# Patient Record
Sex: Female | Born: 1940 | Race: White | Hispanic: No | Marital: Married | Smoking: Former smoker
Health system: Southern US, Community
[De-identification: ages and names within clinical notes are randomized; demographics above are authoritative.]

## PROBLEM LIST (undated history)

## (undated) DIAGNOSIS — E7800 Pure hypercholesterolemia, unspecified: Secondary | ICD-10-CM

## (undated) DIAGNOSIS — M1990 Unspecified osteoarthritis, unspecified site: Secondary | ICD-10-CM

## (undated) DIAGNOSIS — I10 Essential (primary) hypertension: Secondary | ICD-10-CM

## (undated) DIAGNOSIS — R42 Dizziness and giddiness: Secondary | ICD-10-CM

## (undated) DIAGNOSIS — M19011 Primary osteoarthritis, right shoulder: Secondary | ICD-10-CM

## (undated) DIAGNOSIS — M8580 Other specified disorders of bone density and structure, unspecified site: Secondary | ICD-10-CM

## (undated) DIAGNOSIS — Z8709 Personal history of other diseases of the respiratory system: Secondary | ICD-10-CM

## (undated) DIAGNOSIS — R0600 Dyspnea, unspecified: Secondary | ICD-10-CM

## (undated) DIAGNOSIS — Z8701 Personal history of pneumonia (recurrent): Secondary | ICD-10-CM

## (undated) DIAGNOSIS — Z9889 Other specified postprocedural states: Secondary | ICD-10-CM

## (undated) DIAGNOSIS — R112 Nausea with vomiting, unspecified: Secondary | ICD-10-CM

## (undated) DIAGNOSIS — E669 Obesity, unspecified: Secondary | ICD-10-CM

## (undated) DIAGNOSIS — F419 Anxiety disorder, unspecified: Secondary | ICD-10-CM

## (undated) DIAGNOSIS — Z87442 Personal history of urinary calculi: Secondary | ICD-10-CM

## (undated) DIAGNOSIS — F329 Major depressive disorder, single episode, unspecified: Secondary | ICD-10-CM

## (undated) DIAGNOSIS — F32A Depression, unspecified: Secondary | ICD-10-CM

## (undated) DIAGNOSIS — G4700 Insomnia, unspecified: Secondary | ICD-10-CM

## (undated) DIAGNOSIS — M797 Fibromyalgia: Secondary | ICD-10-CM

## (undated) DIAGNOSIS — J45909 Unspecified asthma, uncomplicated: Secondary | ICD-10-CM

## (undated) DIAGNOSIS — Z862 Personal history of diseases of the blood and blood-forming organs and certain disorders involving the immune mechanism: Secondary | ICD-10-CM

## (undated) DIAGNOSIS — G629 Polyneuropathy, unspecified: Secondary | ICD-10-CM

## (undated) DIAGNOSIS — K5790 Diverticulosis of intestine, part unspecified, without perforation or abscess without bleeding: Secondary | ICD-10-CM

## (undated) DIAGNOSIS — I82409 Acute embolism and thrombosis of unspecified deep veins of unspecified lower extremity: Secondary | ICD-10-CM

## (undated) DIAGNOSIS — J189 Pneumonia, unspecified organism: Secondary | ICD-10-CM

## (undated) DIAGNOSIS — D649 Anemia, unspecified: Secondary | ICD-10-CM

## (undated) DIAGNOSIS — R7303 Prediabetes: Secondary | ICD-10-CM

## (undated) DIAGNOSIS — E559 Vitamin D deficiency, unspecified: Secondary | ICD-10-CM

## (undated) DIAGNOSIS — Z973 Presence of spectacles and contact lenses: Secondary | ICD-10-CM

## (undated) HISTORY — PX: TUBAL LIGATION: SHX77

## (undated) HISTORY — PX: MENISCUS REPAIR: SHX5179

## (undated) HISTORY — PX: FOOT FRACTURE SURGERY: SHX645

## (undated) HISTORY — PX: APPENDECTOMY: SHX54

## (undated) HISTORY — PX: BREAST BIOPSY: SHX20

## (undated) HISTORY — PX: SHOULDER SURGERY: SHX246

## (undated) HISTORY — PX: BACK SURGERY: SHX140

## (undated) HISTORY — PX: PLANTAR FASCIA SURGERY: SHX746

## (undated) HISTORY — PX: BLADDER REPAIR: SHX76

## (undated) HISTORY — PX: EYE SURGERY: SHX253

## (undated) HISTORY — PX: RECTOCELE REPAIR: SHX761

## (undated) HISTORY — PX: BREAST LUMPECTOMY: SHX2

## (undated) HISTORY — PX: KNEE CARTILAGE SURGERY: SHX688

## (undated) HISTORY — PX: CHOLECYSTECTOMY: SHX55

## (undated) HISTORY — PX: KNEE ARTHROPLASTY: SHX992

## (undated) HISTORY — PX: COLONOSCOPY W/ POLYPECTOMY: SHX1380

## (undated) HISTORY — PX: SHOULDER ARTHROSCOPY: SHX128

## (undated) HISTORY — PX: ABDOMINAL HYSTERECTOMY: SHX81

## (undated) HISTORY — PX: ESOPHAGOGASTRODUODENOSCOPY: SHX1529

---

## 2015-09-12 DIAGNOSIS — M1611 Unilateral primary osteoarthritis, right hip: Secondary | ICD-10-CM | POA: Diagnosis not present

## 2015-09-12 DIAGNOSIS — G609 Hereditary and idiopathic neuropathy, unspecified: Secondary | ICD-10-CM | POA: Diagnosis not present

## 2015-09-12 DIAGNOSIS — M25551 Pain in right hip: Secondary | ICD-10-CM | POA: Diagnosis not present

## 2015-09-12 DIAGNOSIS — Z6838 Body mass index (BMI) 38.0-38.9, adult: Secondary | ICD-10-CM | POA: Diagnosis not present

## 2015-09-12 DIAGNOSIS — E669 Obesity, unspecified: Secondary | ICD-10-CM | POA: Diagnosis not present

## 2015-09-12 DIAGNOSIS — M7061 Trochanteric bursitis, right hip: Secondary | ICD-10-CM | POA: Diagnosis not present

## 2015-09-18 DIAGNOSIS — G609 Hereditary and idiopathic neuropathy, unspecified: Secondary | ICD-10-CM | POA: Diagnosis not present

## 2015-09-18 DIAGNOSIS — M797 Fibromyalgia: Secondary | ICD-10-CM | POA: Diagnosis not present

## 2015-09-18 DIAGNOSIS — M5416 Radiculopathy, lumbar region: Secondary | ICD-10-CM | POA: Diagnosis not present

## 2015-10-03 DIAGNOSIS — M5442 Lumbago with sciatica, left side: Secondary | ICD-10-CM | POA: Diagnosis not present

## 2015-10-03 DIAGNOSIS — M1611 Unilateral primary osteoarthritis, right hip: Secondary | ICD-10-CM | POA: Diagnosis not present

## 2015-10-03 DIAGNOSIS — M5441 Lumbago with sciatica, right side: Secondary | ICD-10-CM | POA: Diagnosis not present

## 2015-10-03 DIAGNOSIS — M4807 Spinal stenosis, lumbosacral region: Secondary | ICD-10-CM | POA: Diagnosis not present

## 2015-10-03 DIAGNOSIS — G8929 Other chronic pain: Secondary | ICD-10-CM | POA: Diagnosis not present

## 2015-10-11 DIAGNOSIS — M797 Fibromyalgia: Secondary | ICD-10-CM | POA: Diagnosis not present

## 2015-10-11 DIAGNOSIS — G609 Hereditary and idiopathic neuropathy, unspecified: Secondary | ICD-10-CM | POA: Diagnosis not present

## 2015-10-11 DIAGNOSIS — M5416 Radiculopathy, lumbar region: Secondary | ICD-10-CM | POA: Diagnosis not present

## 2015-10-13 DIAGNOSIS — Z87891 Personal history of nicotine dependence: Secondary | ICD-10-CM | POA: Diagnosis not present

## 2015-10-13 DIAGNOSIS — M1712 Unilateral primary osteoarthritis, left knee: Secondary | ICD-10-CM | POA: Diagnosis not present

## 2015-10-13 DIAGNOSIS — M25562 Pain in left knee: Secondary | ICD-10-CM | POA: Diagnosis not present

## 2015-10-13 DIAGNOSIS — M660 Rupture of popliteal cyst: Secondary | ICD-10-CM | POA: Diagnosis not present

## 2015-10-13 DIAGNOSIS — M79662 Pain in left lower leg: Secondary | ICD-10-CM | POA: Diagnosis not present

## 2015-10-18 DIAGNOSIS — G8929 Other chronic pain: Secondary | ICD-10-CM | POA: Diagnosis not present

## 2015-10-18 DIAGNOSIS — E538 Deficiency of other specified B group vitamins: Secondary | ICD-10-CM | POA: Diagnosis not present

## 2015-10-18 DIAGNOSIS — M545 Low back pain: Secondary | ICD-10-CM | POA: Diagnosis not present

## 2015-10-23 DIAGNOSIS — H04122 Dry eye syndrome of left lacrimal gland: Secondary | ICD-10-CM | POA: Diagnosis not present

## 2015-10-25 DIAGNOSIS — M791 Myalgia: Secondary | ICD-10-CM | POA: Diagnosis not present

## 2015-10-25 DIAGNOSIS — M25551 Pain in right hip: Secondary | ICD-10-CM | POA: Diagnosis not present

## 2015-10-25 DIAGNOSIS — Z96659 Presence of unspecified artificial knee joint: Secondary | ICD-10-CM | POA: Diagnosis not present

## 2015-10-25 DIAGNOSIS — Z96651 Presence of right artificial knee joint: Secondary | ICD-10-CM | POA: Diagnosis not present

## 2015-10-25 DIAGNOSIS — M47816 Spondylosis without myelopathy or radiculopathy, lumbar region: Secondary | ICD-10-CM | POA: Diagnosis not present

## 2015-10-25 DIAGNOSIS — Z79899 Other long term (current) drug therapy: Secondary | ICD-10-CM | POA: Diagnosis not present

## 2015-10-25 DIAGNOSIS — G8929 Other chronic pain: Secondary | ICD-10-CM | POA: Diagnosis not present

## 2015-10-25 DIAGNOSIS — G894 Chronic pain syndrome: Secondary | ICD-10-CM | POA: Diagnosis not present

## 2015-10-25 DIAGNOSIS — Z5181 Encounter for therapeutic drug level monitoring: Secondary | ICD-10-CM | POA: Diagnosis not present

## 2015-10-25 DIAGNOSIS — M25561 Pain in right knee: Secondary | ICD-10-CM | POA: Diagnosis not present

## 2015-10-25 DIAGNOSIS — M25562 Pain in left knee: Secondary | ICD-10-CM | POA: Diagnosis not present

## 2015-10-25 DIAGNOSIS — E1340 Other specified diabetes mellitus with diabetic neuropathy, unspecified: Secondary | ICD-10-CM | POA: Diagnosis not present

## 2015-10-31 DIAGNOSIS — Z1231 Encounter for screening mammogram for malignant neoplasm of breast: Secondary | ICD-10-CM | POA: Diagnosis not present

## 2015-11-01 DIAGNOSIS — K5900 Constipation, unspecified: Secondary | ICD-10-CM | POA: Diagnosis not present

## 2015-11-03 DIAGNOSIS — M1712 Unilateral primary osteoarthritis, left knee: Secondary | ICD-10-CM | POA: Diagnosis not present

## 2015-11-03 DIAGNOSIS — M25562 Pain in left knee: Secondary | ICD-10-CM | POA: Diagnosis not present

## 2015-11-03 DIAGNOSIS — J45909 Unspecified asthma, uncomplicated: Secondary | ICD-10-CM | POA: Diagnosis not present

## 2015-11-03 DIAGNOSIS — I10 Essential (primary) hypertension: Secondary | ICD-10-CM | POA: Diagnosis not present

## 2015-11-03 DIAGNOSIS — Z79899 Other long term (current) drug therapy: Secondary | ICD-10-CM | POA: Diagnosis not present

## 2015-11-03 DIAGNOSIS — Z885 Allergy status to narcotic agent status: Secondary | ICD-10-CM | POA: Diagnosis not present

## 2015-11-03 DIAGNOSIS — Z87891 Personal history of nicotine dependence: Secondary | ICD-10-CM | POA: Diagnosis not present

## 2015-11-03 DIAGNOSIS — E7800 Pure hypercholesterolemia, unspecified: Secondary | ICD-10-CM | POA: Diagnosis not present

## 2015-11-03 DIAGNOSIS — K5900 Constipation, unspecified: Secondary | ICD-10-CM | POA: Diagnosis not present

## 2015-11-03 DIAGNOSIS — Z880 Allergy status to penicillin: Secondary | ICD-10-CM | POA: Diagnosis not present

## 2015-11-03 DIAGNOSIS — Z888 Allergy status to other drugs, medicaments and biological substances status: Secondary | ICD-10-CM | POA: Diagnosis not present

## 2015-11-03 DIAGNOSIS — Z881 Allergy status to other antibiotic agents status: Secondary | ICD-10-CM | POA: Diagnosis not present

## 2015-11-15 DIAGNOSIS — R920 Mammographic microcalcification found on diagnostic imaging of breast: Secondary | ICD-10-CM | POA: Diagnosis not present

## 2015-11-15 DIAGNOSIS — R921 Mammographic calcification found on diagnostic imaging of breast: Secondary | ICD-10-CM | POA: Diagnosis not present

## 2015-11-15 DIAGNOSIS — E538 Deficiency of other specified B group vitamins: Secondary | ICD-10-CM | POA: Diagnosis not present

## 2015-11-16 DIAGNOSIS — R920 Mammographic microcalcification found on diagnostic imaging of breast: Secondary | ICD-10-CM | POA: Diagnosis not present

## 2015-11-21 DIAGNOSIS — M1611 Unilateral primary osteoarthritis, right hip: Secondary | ICD-10-CM | POA: Diagnosis not present

## 2015-11-21 DIAGNOSIS — G894 Chronic pain syndrome: Secondary | ICD-10-CM | POA: Diagnosis not present

## 2015-11-21 DIAGNOSIS — M25551 Pain in right hip: Secondary | ICD-10-CM | POA: Diagnosis not present

## 2015-11-24 DIAGNOSIS — M170 Bilateral primary osteoarthritis of knee: Secondary | ICD-10-CM | POA: Diagnosis not present

## 2015-11-24 DIAGNOSIS — M1712 Unilateral primary osteoarthritis, left knee: Secondary | ICD-10-CM | POA: Diagnosis not present

## 2015-11-27 DIAGNOSIS — Z961 Presence of intraocular lens: Secondary | ICD-10-CM | POA: Diagnosis not present

## 2015-11-27 DIAGNOSIS — H43812 Vitreous degeneration, left eye: Secondary | ICD-10-CM | POA: Diagnosis not present

## 2015-11-27 DIAGNOSIS — H5203 Hypermetropia, bilateral: Secondary | ICD-10-CM | POA: Diagnosis not present

## 2015-11-27 DIAGNOSIS — H04123 Dry eye syndrome of bilateral lacrimal glands: Secondary | ICD-10-CM | POA: Diagnosis not present

## 2015-11-27 DIAGNOSIS — H43391 Other vitreous opacities, right eye: Secondary | ICD-10-CM | POA: Diagnosis not present

## 2015-11-29 DIAGNOSIS — Z0000 Encounter for general adult medical examination without abnormal findings: Secondary | ICD-10-CM | POA: Diagnosis not present

## 2015-12-01 DIAGNOSIS — M170 Bilateral primary osteoarthritis of knee: Secondary | ICD-10-CM | POA: Diagnosis not present

## 2015-12-01 DIAGNOSIS — M1712 Unilateral primary osteoarthritis, left knee: Secondary | ICD-10-CM | POA: Diagnosis not present

## 2015-12-08 DIAGNOSIS — M7052 Other bursitis of knee, left knee: Secondary | ICD-10-CM | POA: Diagnosis not present

## 2015-12-08 DIAGNOSIS — M1712 Unilateral primary osteoarthritis, left knee: Secondary | ICD-10-CM | POA: Diagnosis not present

## 2015-12-11 DIAGNOSIS — F330 Major depressive disorder, recurrent, mild: Secondary | ICD-10-CM | POA: Diagnosis not present

## 2015-12-11 DIAGNOSIS — Z6838 Body mass index (BMI) 38.0-38.9, adult: Secondary | ICD-10-CM | POA: Diagnosis not present

## 2015-12-11 DIAGNOSIS — L738 Other specified follicular disorders: Secondary | ICD-10-CM | POA: Diagnosis not present

## 2015-12-11 DIAGNOSIS — E538 Deficiency of other specified B group vitamins: Secondary | ICD-10-CM | POA: Diagnosis not present

## 2015-12-11 DIAGNOSIS — G44229 Chronic tension-type headache, not intractable: Secondary | ICD-10-CM | POA: Diagnosis not present

## 2015-12-11 DIAGNOSIS — R5383 Other fatigue: Secondary | ICD-10-CM | POA: Diagnosis not present

## 2015-12-11 DIAGNOSIS — E669 Obesity, unspecified: Secondary | ICD-10-CM | POA: Diagnosis not present

## 2015-12-19 DIAGNOSIS — M5416 Radiculopathy, lumbar region: Secondary | ICD-10-CM | POA: Diagnosis not present

## 2015-12-19 DIAGNOSIS — M797 Fibromyalgia: Secondary | ICD-10-CM | POA: Diagnosis not present

## 2015-12-19 DIAGNOSIS — E538 Deficiency of other specified B group vitamins: Secondary | ICD-10-CM | POA: Diagnosis not present

## 2015-12-19 DIAGNOSIS — G609 Hereditary and idiopathic neuropathy, unspecified: Secondary | ICD-10-CM | POA: Diagnosis not present

## 2015-12-19 DIAGNOSIS — G4700 Insomnia, unspecified: Secondary | ICD-10-CM | POA: Diagnosis not present

## 2015-12-21 DIAGNOSIS — M5416 Radiculopathy, lumbar region: Secondary | ICD-10-CM | POA: Diagnosis not present

## 2015-12-21 DIAGNOSIS — F5231 Female orgasmic disorder: Secondary | ICD-10-CM | POA: Diagnosis not present

## 2015-12-21 DIAGNOSIS — E538 Deficiency of other specified B group vitamins: Secondary | ICD-10-CM | POA: Diagnosis not present

## 2015-12-21 DIAGNOSIS — F330 Major depressive disorder, recurrent, mild: Secondary | ICD-10-CM | POA: Diagnosis not present

## 2015-12-21 DIAGNOSIS — J4530 Mild persistent asthma, uncomplicated: Secondary | ICD-10-CM | POA: Diagnosis not present

## 2015-12-21 DIAGNOSIS — E669 Obesity, unspecified: Secondary | ICD-10-CM | POA: Diagnosis not present

## 2015-12-21 DIAGNOSIS — M7552 Bursitis of left shoulder: Secondary | ICD-10-CM | POA: Diagnosis not present

## 2015-12-21 DIAGNOSIS — Z6839 Body mass index (BMI) 39.0-39.9, adult: Secondary | ICD-10-CM | POA: Diagnosis not present

## 2015-12-29 DIAGNOSIS — E538 Deficiency of other specified B group vitamins: Secondary | ICD-10-CM | POA: Diagnosis not present

## 2015-12-29 DIAGNOSIS — J4530 Mild persistent asthma, uncomplicated: Secondary | ICD-10-CM | POA: Diagnosis not present

## 2016-01-02 DIAGNOSIS — M25519 Pain in unspecified shoulder: Secondary | ICD-10-CM | POA: Diagnosis not present

## 2016-01-02 DIAGNOSIS — M791 Myalgia: Secondary | ICD-10-CM | POA: Diagnosis not present

## 2016-01-02 DIAGNOSIS — G5682 Other specified mononeuropathies of left upper limb: Secondary | ICD-10-CM | POA: Diagnosis not present

## 2016-01-02 DIAGNOSIS — G894 Chronic pain syndrome: Secondary | ICD-10-CM | POA: Diagnosis not present

## 2016-01-02 DIAGNOSIS — Z96651 Presence of right artificial knee joint: Secondary | ICD-10-CM | POA: Diagnosis not present

## 2016-01-02 DIAGNOSIS — G8929 Other chronic pain: Secondary | ICD-10-CM | POA: Diagnosis not present

## 2016-01-02 DIAGNOSIS — M25551 Pain in right hip: Secondary | ICD-10-CM | POA: Diagnosis not present

## 2016-01-02 DIAGNOSIS — M47816 Spondylosis without myelopathy or radiculopathy, lumbar region: Secondary | ICD-10-CM | POA: Diagnosis not present

## 2016-01-02 DIAGNOSIS — R1031 Right lower quadrant pain: Secondary | ICD-10-CM | POA: Diagnosis not present

## 2016-01-02 DIAGNOSIS — Z87891 Personal history of nicotine dependence: Secondary | ICD-10-CM | POA: Diagnosis not present

## 2016-01-02 DIAGNOSIS — M25512 Pain in left shoulder: Secondary | ICD-10-CM | POA: Diagnosis not present

## 2016-01-11 DIAGNOSIS — E538 Deficiency of other specified B group vitamins: Secondary | ICD-10-CM | POA: Diagnosis not present

## 2016-01-18 DIAGNOSIS — M25562 Pain in left knee: Secondary | ICD-10-CM | POA: Diagnosis not present

## 2016-01-18 DIAGNOSIS — G8929 Other chronic pain: Secondary | ICD-10-CM | POA: Diagnosis not present

## 2016-01-18 DIAGNOSIS — G894 Chronic pain syndrome: Secondary | ICD-10-CM | POA: Diagnosis not present

## 2016-01-18 DIAGNOSIS — M25551 Pain in right hip: Secondary | ICD-10-CM | POA: Diagnosis not present

## 2016-01-18 DIAGNOSIS — M25561 Pain in right knee: Secondary | ICD-10-CM | POA: Diagnosis not present

## 2016-02-06 DIAGNOSIS — G8929 Other chronic pain: Secondary | ICD-10-CM | POA: Diagnosis not present

## 2016-02-06 DIAGNOSIS — M25551 Pain in right hip: Secondary | ICD-10-CM | POA: Diagnosis not present

## 2016-02-13 DIAGNOSIS — E538 Deficiency of other specified B group vitamins: Secondary | ICD-10-CM | POA: Diagnosis not present

## 2016-02-13 DIAGNOSIS — M1611 Unilateral primary osteoarthritis, right hip: Secondary | ICD-10-CM | POA: Diagnosis not present

## 2016-02-13 DIAGNOSIS — M1712 Unilateral primary osteoarthritis, left knee: Secondary | ICD-10-CM | POA: Diagnosis not present

## 2016-02-13 DIAGNOSIS — G8929 Other chronic pain: Secondary | ICD-10-CM | POA: Diagnosis not present

## 2016-02-13 DIAGNOSIS — M25562 Pain in left knee: Secondary | ICD-10-CM | POA: Diagnosis not present

## 2016-02-13 DIAGNOSIS — G894 Chronic pain syndrome: Secondary | ICD-10-CM | POA: Diagnosis not present

## 2016-02-13 DIAGNOSIS — M25551 Pain in right hip: Secondary | ICD-10-CM | POA: Diagnosis not present

## 2016-02-13 DIAGNOSIS — M797 Fibromyalgia: Secondary | ICD-10-CM | POA: Diagnosis not present

## 2016-02-26 DIAGNOSIS — E538 Deficiency of other specified B group vitamins: Secondary | ICD-10-CM | POA: Diagnosis not present

## 2016-03-06 DIAGNOSIS — M1611 Unilateral primary osteoarthritis, right hip: Secondary | ICD-10-CM | POA: Diagnosis not present

## 2016-03-06 DIAGNOSIS — E538 Deficiency of other specified B group vitamins: Secondary | ICD-10-CM | POA: Diagnosis not present

## 2016-03-06 DIAGNOSIS — M797 Fibromyalgia: Secondary | ICD-10-CM | POA: Diagnosis not present

## 2016-03-06 DIAGNOSIS — G609 Hereditary and idiopathic neuropathy, unspecified: Secondary | ICD-10-CM | POA: Diagnosis not present

## 2016-03-06 DIAGNOSIS — M170 Bilateral primary osteoarthritis of knee: Secondary | ICD-10-CM | POA: Diagnosis not present

## 2016-03-11 DIAGNOSIS — M7062 Trochanteric bursitis, left hip: Secondary | ICD-10-CM | POA: Diagnosis not present

## 2016-03-11 DIAGNOSIS — E538 Deficiency of other specified B group vitamins: Secondary | ICD-10-CM | POA: Diagnosis not present

## 2016-03-11 DIAGNOSIS — G894 Chronic pain syndrome: Secondary | ICD-10-CM | POA: Diagnosis not present

## 2016-03-11 DIAGNOSIS — M7061 Trochanteric bursitis, right hip: Secondary | ICD-10-CM | POA: Diagnosis not present

## 2016-03-11 DIAGNOSIS — G8929 Other chronic pain: Secondary | ICD-10-CM | POA: Diagnosis not present

## 2016-03-11 DIAGNOSIS — M1611 Unilateral primary osteoarthritis, right hip: Secondary | ICD-10-CM | POA: Diagnosis not present

## 2016-03-26 DIAGNOSIS — E538 Deficiency of other specified B group vitamins: Secondary | ICD-10-CM | POA: Diagnosis not present

## 2016-04-10 DIAGNOSIS — E538 Deficiency of other specified B group vitamins: Secondary | ICD-10-CM | POA: Diagnosis not present

## 2016-04-18 DIAGNOSIS — M1611 Unilateral primary osteoarthritis, right hip: Secondary | ICD-10-CM | POA: Diagnosis not present

## 2016-04-23 DIAGNOSIS — I10 Essential (primary) hypertension: Secondary | ICD-10-CM | POA: Diagnosis not present

## 2016-04-23 DIAGNOSIS — E669 Obesity, unspecified: Secondary | ICD-10-CM | POA: Diagnosis not present

## 2016-04-23 DIAGNOSIS — Z0000 Encounter for general adult medical examination without abnormal findings: Secondary | ICD-10-CM | POA: Diagnosis not present

## 2016-04-23 DIAGNOSIS — E538 Deficiency of other specified B group vitamins: Secondary | ICD-10-CM | POA: Diagnosis not present

## 2016-04-23 DIAGNOSIS — Z6839 Body mass index (BMI) 39.0-39.9, adult: Secondary | ICD-10-CM | POA: Diagnosis not present

## 2016-05-07 DIAGNOSIS — M7552 Bursitis of left shoulder: Secondary | ICD-10-CM | POA: Diagnosis not present

## 2016-05-07 DIAGNOSIS — E538 Deficiency of other specified B group vitamins: Secondary | ICD-10-CM | POA: Diagnosis not present

## 2016-05-20 DIAGNOSIS — R921 Mammographic calcification found on diagnostic imaging of breast: Secondary | ICD-10-CM | POA: Diagnosis not present

## 2016-05-20 DIAGNOSIS — R920 Mammographic microcalcification found on diagnostic imaging of breast: Secondary | ICD-10-CM | POA: Diagnosis not present

## 2016-05-21 DIAGNOSIS — E538 Deficiency of other specified B group vitamins: Secondary | ICD-10-CM | POA: Diagnosis not present

## 2016-05-27 ENCOUNTER — Other Ambulatory Visit: Payer: Self-pay | Admitting: Surgery

## 2016-05-27 DIAGNOSIS — R921 Mammographic calcification found on diagnostic imaging of breast: Secondary | ICD-10-CM

## 2016-05-27 DIAGNOSIS — R920 Mammographic microcalcification found on diagnostic imaging of breast: Secondary | ICD-10-CM | POA: Diagnosis not present

## 2016-05-30 DIAGNOSIS — M1611 Unilateral primary osteoarthritis, right hip: Secondary | ICD-10-CM | POA: Diagnosis not present

## 2016-06-03 ENCOUNTER — Ambulatory Visit: Payer: Self-pay | Admitting: Orthopedic Surgery

## 2016-06-03 ENCOUNTER — Ambulatory Visit
Admission: RE | Admit: 2016-06-03 | Discharge: 2016-06-03 | Disposition: A | Discharge status: Home / Self Care | Payer: PPO | Source: Ambulatory Visit | Attending: Surgery | Admitting: Surgery

## 2016-06-03 DIAGNOSIS — R921 Mammographic calcification found on diagnostic imaging of breast: Secondary | ICD-10-CM | POA: Diagnosis not present

## 2016-06-03 DIAGNOSIS — N6011 Diffuse cystic mastopathy of right breast: Secondary | ICD-10-CM | POA: Diagnosis not present

## 2016-06-04 DIAGNOSIS — E538 Deficiency of other specified B group vitamins: Secondary | ICD-10-CM | POA: Diagnosis not present

## 2016-06-06 DIAGNOSIS — E538 Deficiency of other specified B group vitamins: Secondary | ICD-10-CM | POA: Diagnosis not present

## 2016-06-10 DIAGNOSIS — R920 Mammographic microcalcification found on diagnostic imaging of breast: Secondary | ICD-10-CM | POA: Diagnosis not present

## 2016-06-11 DIAGNOSIS — E538 Deficiency of other specified B group vitamins: Secondary | ICD-10-CM | POA: Diagnosis not present

## 2016-06-11 DIAGNOSIS — G609 Hereditary and idiopathic neuropathy, unspecified: Secondary | ICD-10-CM | POA: Diagnosis not present

## 2016-06-11 DIAGNOSIS — M1611 Unilateral primary osteoarthritis, right hip: Secondary | ICD-10-CM | POA: Diagnosis not present

## 2016-06-11 DIAGNOSIS — M5416 Radiculopathy, lumbar region: Secondary | ICD-10-CM | POA: Diagnosis not present

## 2016-06-12 NOTE — Pre-Procedure Instructions (Signed)
Evelyn Valdez  06/12/2016      [id number] - [place], [place] - [address] [place] [address] Phone: [phone] Fax: [phone]    Your procedure is scheduled on Monday, October 23rd, 2017.  Report to [facility] Admitting at 11:00 A.M.   Call this number if you have problems the morning of surgery:  [phone]   Remember:  Do not eat food or drink liquids after midnight.   Take these medicines the morning of surgery with A SIP OF WATER: Acetaminophen (Tylenol) if needed, Albuterol inhaler (please bring with you day of surgery), Mometasone inhaler if needed.    7 days prior to surgery, stop taking: Aspirin, NSAIDS, Aleve, Naproxen, Ibuprofen, Advil, Motrin, BC's, Goody's, fish oil, all herbal medications, and all vitamins.    Do not wear jewelry, make-up or nail polish.  Do not wear lotions, powders, or perfumes, or deoderant.  Do not shave 48 hours prior to surgery.    Do not bring valuables to the hospital.  [facility] is not responsible for any belongings or valuables.  Contacts, dentures or bridgework may not be worn into surgery.  Leave your suitcase in the car.  After surgery it may be brought to your room.  For patients admitted to the hospital, discharge time will be determined by your treatment team.  Patients discharged the day of surgery will not be allowed to drive home.   Special instructions:  Preparing for Surgery.   Please read over the following fact sheets that you were given. MRSA Information    [facility]- Preparing For Surgery  Before surgery, you can play an important role. Because skin is not sterile, your skin needs to be as free of germs as possible. You can reduce the number of germs on your skin by washing with CHG (chlorahexidine gluconate) Soap before surgery.  CHG is an antiseptic cleaner which kills germs and bonds with the skin to continue killing germs even after washing.  Please  do not use if you have an allergy to CHG or antibacterial soaps. If your skin becomes reddened/irritated stop using the CHG.  Do not shave (including legs and underarms) for at least 48 hours prior to first CHG shower. It is OK to shave your face.  Please follow these instructions carefully.   1. Shower the NIGHT BEFORE SURGERY and the MORNING OF SURGERY with CHG.   2. If you chose to wash your hair, wash your hair first as usual with your normal shampoo.  3. After you shampoo, rinse your hair and body thoroughly to remove the shampoo.  4. Use CHG as you would any other liquid soap. You can apply CHG directly to the skin and wash gently with a scrungie or a clean washcloth.   5. Apply the CHG Soap to your body ONLY FROM THE NECK DOWN.  Do not use on open wounds or open sores. Avoid contact with your eyes, ears, mouth and genitals (private parts). Wash genitals (private parts) with your normal soap.  6. Wash thoroughly, paying special attention to the area where your surgery will be performed.  7. Thoroughly rinse your body with warm water from the neck down.  8. DO NOT shower/wash with your normal soap after using and rinsing off the CHG Soap.  9. Pat yourself dry with a CLEAN TOWEL.   10. Wear CLEAN PAJAMAS   11. Place CLEAN SHEETS on your bed the night of your  first shower and DO NOT SLEEP WITH PETS.  Day of Surgery: Do not apply any deodorants/lotions. Please wear clean clothes to the hospital/surgery center.

## 2016-06-13 ENCOUNTER — Encounter (HOSPITAL_COMMUNITY)
Admission: RE | Admit: 2016-06-13 | Discharge: 2016-06-13 | Disposition: A | Discharge status: Home / Self Care | Payer: PPO | Source: Ambulatory Visit | Attending: Orthopedic Surgery | Admitting: Orthopedic Surgery

## 2016-06-13 ENCOUNTER — Encounter (HOSPITAL_COMMUNITY): Payer: Self-pay | Admitting: General Practice

## 2016-06-13 ENCOUNTER — Ambulatory Visit: Payer: Self-pay | Admitting: Orthopedic Surgery

## 2016-06-13 DIAGNOSIS — I10 Essential (primary) hypertension: Secondary | ICD-10-CM | POA: Insufficient documentation

## 2016-06-13 DIAGNOSIS — M1611 Unilateral primary osteoarthritis, right hip: Secondary | ICD-10-CM | POA: Insufficient documentation

## 2016-06-13 DIAGNOSIS — Z01810 Encounter for preprocedural cardiovascular examination: Secondary | ICD-10-CM | POA: Insufficient documentation

## 2016-06-13 DIAGNOSIS — Z01812 Encounter for preprocedural laboratory examination: Secondary | ICD-10-CM | POA: Insufficient documentation

## 2016-06-13 HISTORY — DX: Diverticulosis of intestine, part unspecified, without perforation or abscess without bleeding: K57.90

## 2016-06-13 HISTORY — DX: Anxiety disorder, unspecified: F41.9

## 2016-06-13 HISTORY — DX: Acute embolism and thrombosis of unspecified deep veins of unspecified lower extremity: I82.409

## 2016-06-13 HISTORY — DX: Unspecified asthma, uncomplicated: J45.909

## 2016-06-13 HISTORY — DX: Dyspnea, unspecified: R06.00

## 2016-06-13 HISTORY — DX: Vitamin D deficiency, unspecified: E55.9

## 2016-06-13 HISTORY — DX: Personal history of pneumonia (recurrent): Z87.01

## 2016-06-13 HISTORY — DX: Dizziness and giddiness: R42

## 2016-06-13 HISTORY — DX: Personal history of diseases of the blood and blood-forming organs and certain disorders involving the immune mechanism: Z86.2

## 2016-06-13 HISTORY — DX: Essential (primary) hypertension: I10

## 2016-06-13 HISTORY — DX: Unspecified osteoarthritis, unspecified site: M19.90

## 2016-06-13 HISTORY — DX: Insomnia, unspecified: G47.00

## 2016-06-13 HISTORY — DX: Fibromyalgia: M79.7

## 2016-06-13 HISTORY — DX: Pure hypercholesterolemia, unspecified: E78.00

## 2016-06-13 HISTORY — DX: Major depressive disorder, single episode, unspecified: F32.9

## 2016-06-13 HISTORY — DX: Presence of spectacles and contact lenses: Z97.3

## 2016-06-13 HISTORY — DX: Personal history of other diseases of the respiratory system: Z87.09

## 2016-06-13 HISTORY — DX: Personal history of urinary calculi: Z87.442

## 2016-06-13 HISTORY — DX: Obesity, unspecified: E66.9

## 2016-06-13 HISTORY — DX: Polyneuropathy, unspecified: G62.9

## 2016-06-13 HISTORY — DX: Depression, unspecified: F32.A

## 2016-06-13 HISTORY — DX: Other specified disorders of bone density and structure, unspecified site: M85.80

## 2016-06-13 LAB — BASIC METABOLIC PANEL
Anion gap: 6 (ref 5–15)
BUN: 13 mg/dL (ref 6–20)
CALCIUM: 9.1 mg/dL (ref 8.9–10.3)
CO2: 23 mmol/L (ref 22–32)
CREATININE: 0.84 mg/dL (ref 0.44–1.00)
Chloride: 110 mmol/L (ref 101–111)
GFR calc Af Amer: >60 mL/min (ref >60)
GLUCOSE: 107 mg/dL — AB (ref 65–99)
Potassium: 4.1 mmol/L (ref 3.5–5.1)
SODIUM: 139 mmol/L (ref 135–145)

## 2016-06-13 LAB — CBC
HCT: 42.6 % (ref 36.0–46.0)
Hemoglobin: 13.4 g/dL (ref 12.0–15.0)
MCH: 27.7 pg (ref 26.0–34.0)
MCHC: 31.5 g/dL (ref 30.0–36.0)
MCV: 88.2 fL (ref 78.0–100.0)
PLATELETS: 229 10*3/uL (ref 150–400)
RBC: 4.83 MIL/uL (ref 3.87–5.11)
RDW: 16.4 % — AB (ref 11.5–15.5)
WBC: 7.5 10*3/uL (ref 4.0–10.5)

## 2016-06-13 LAB — SURGICAL PCR SCREEN
MRSA, PCR: NEGATIVE
Staphylococcus aureus: NEGATIVE

## 2016-06-13 NOTE — H&P (Signed)
TOTAL HIP ADMISSION H&P  Patient is admitted for right total hip arthroplasty.  Subjective:  Chief Complaint: right hip pain  HPI: Evelyn Valdez, 75 y.o. female, has a history of pain and functional disability in the right hip(s) due to trauma and patient has failed non-surgical conservative treatments for greater than 12 weeks to include NSAID's and/or analgesics, flexibility and strengthening excercises, weight reduction as appropriate and activity modification.  Onset of symptoms was gradual starting >10 years ago with gradually worsening course since that time.The patient noted no past surgery on the right hip(s).  Patient currently rates pain in the right hip at 10 out of 10 with activity. Patient has night pain, worsening of pain with activity and weight bearing, pain that interfers with activities of daily living, pain with passive range of motion and crepitus. Patient has evidence of subchondral cysts, subchondral sclerosis, periarticular osteophytes and joint space narrowing by imaging studies. This condition presents safety issues increasing the risk of falls. There is no current active infection.  There are no active problems to display for this patient.  Past Medical History:  Diagnosis Date  . Anxiety   . Asthma    "well controlled"  . Depression   . Diverticulosis   . Dizziness   . DVT (deep venous thrombosis) ([place])    right lower leg after knee surgery  . Dyspnea    with exertion  . Fibromyalgia   . High cholesterol   . History of anemia   . History of bronchitis   . History of kidney stones   . History of pneumonia   . Hypertension   . Insomnia   . Obesity   . Osteoarthritis   . Osteopenia   . Peripheral neuropathy (HCC)    takes Topamax  . Vitamin D deficiency   . Wears glasses     Past Surgical History:  Procedure Laterality Date  . ABDOMINAL HYSTERECTOMY    . APPENDECTOMY    . BACK SURGERY    . BREAST BIOPSY Right    x2  . BREAST LUMPECTOMY Left    . CHOLECYSTECTOMY    . COLONOSCOPY W/ POLYPECTOMY    . ESOPHAGOGASTRODUODENOSCOPY    . EYE SURGERY Bilateral    cataracts  . FOOT FRACTURE SURGERY Left   . KNEE ARTHROPLASTY Right   . KNEE CARTILAGE SURGERY Right    x3  . MENISCUS REPAIR Left   . PLANTAR FASCIA SURGERY Bilateral   . RECTOCELE REPAIR    . SHOULDER SURGERY Right   . TUBAL LIGATION       (Not in a hospital admission) Allergies  Allergen Reactions  . Advair Diskus [Fluticasone-Salmeterol] Other (See Comments)    headaches  . Erythromycin Nausea And Vomiting  . Hydrocodone Nausea And Vomiting  . Morphine And Related Nausea And Vomiting  . Percocet [Oxycodone-Acetaminophen] Nausea And Vomiting  . Sulfa Antibiotics Nausea And Vomiting  . Penicillins Swelling and Rash    Has patient had a PCN reaction causing immediate rash, facial/tongue/throat swelling, SOB or lightheadedness with hypotension: Yes Has patient had a PCN reaction causing severe rash involving mucus membranes or skin necrosis: No Has patient had a PCN reaction that required hospitalization Yes Has patient had a PCN reaction occurring within the last 10 years: No If all of the above answers are "NO", then may proceed with Cephalosporin use.     Social History  Substance Use Topics  . Smoking status: Never Smoker  . Smokeless tobacco: Never Used  .  Alcohol use No    No family history on file.   Review of Systems  Constitutional: Negative.   HENT: Negative.   Eyes: Negative.   Respiratory: Positive for shortness of breath.   Cardiovascular: Negative.   Gastrointestinal: Negative.   Genitourinary: Negative.   Musculoskeletal: Positive for back pain and joint pain.  Skin: Negative.   Neurological: Negative.   Endo/Heme/Allergies: Negative.   Psychiatric/Behavioral: Negative.     Objective:  Physical Exam  Constitutional: She is oriented to person, place, and time. She appears well-developed and well-nourished.  HENT:  Head:  Normocephalic and atraumatic.  Eyes: Conjunctivae and EOM are normal. Pupils are equal, round, and reactive to light.  Neck: Normal range of motion. Neck supple.  Cardiovascular: Normal rate, regular rhythm and intact distal pulses.   Respiratory: Effort normal and breath sounds normal.  GI: Soft. Bowel sounds are normal. She exhibits no distension.  Genitourinary:  Genitourinary Comments: deferred  Musculoskeletal:       Right hip: She exhibits decreased range of motion.  Neurological: She is alert and oriented to person, place, and time. She has normal reflexes.  Skin: Skin is warm and dry.  Psychiatric: She has a normal mood and affect. Her behavior is normal. Judgment and thought content normal.    Vital signs in last 24 hours: @VSRANGES @  Labs:   Estimated body mass index is 37.98 kg/m as calculated from the following:   Height as of an earlier encounter on 06/13/16: 5\' 1"  (1.549 m).   Weight as of an earlier encounter on 06/13/16: 91.2 kg (201 lb).   Imaging Review Plain radiographs demonstrate severe degenerative joint disease of the right hip(s). The bone quality appears to be adequate for age and reported activity level.  Assessment/Plan:  End stage arthritis, right hip(s)  The patient history, physical examination, clinical judgement of the provider and imaging studies are consistent with end stage degenerative joint disease of the right hip(s) and total hip arthroplasty is deemed medically necessary. The treatment options including medical management, injection therapy, arthroscopy and arthroplasty were discussed at length. The risks and benefits of total hip arthroplasty were presented and reviewed. The risks due to aseptic loosening, infection, stiffness, dislocation/subluxation,  thromboembolic complications and other imponderables were discussed.  The patient acknowledged the explanation, agreed to proceed with the plan and consent was signed. Patient is being  admitted for inpatient treatment for surgery, pain control, PT, OT, prophylactic antibiotics, VTE prophylaxis, progressive ambulation and ADL's and discharge planning.The patient is planning to be discharged home with home health services. H/o DVT - apixaban

## 2016-06-13 NOTE — Progress Notes (Signed)
Left message for Margarita Grizzle at Dr. Sid Falcon office about patient's allergy to penicillin and order for Ancef 2g.

## 2016-06-13 NOTE — Progress Notes (Signed)
PCP - Dr. Dineen Kid with [facility] in [place]. Cardiologist - denies  EKG - 06/13/16 CXR - denies Echo/stress test/Cardiac cath - denies  Patient denies chest pain and shortness of breath at PAT appointment.

## 2016-06-21 MED ORDER — ACETAMINOPHEN 10 MG/ML IV SOLN
1000.0000 mg | INTRAVENOUS | Status: AC
  Administered 2016-06-24: 1000 mg via INTRAVENOUS

## 2016-06-21 MED ORDER — SODIUM CHLORIDE 0.9 % IV SOLN
2000.0000 mg | INTRAVENOUS | Status: DC
  Filled 2016-06-21: qty 20

## 2016-06-24 ENCOUNTER — Encounter (HOSPITAL_COMMUNITY)
Admission: RE | Disposition: A | Discharge status: Home Health | Payer: Self-pay | Source: Ambulatory Visit | Attending: Orthopedic Surgery

## 2016-06-24 ENCOUNTER — Inpatient Hospital Stay (HOSPITAL_COMMUNITY)
Admission: RE | Admit: 2016-06-24 | Discharge: 2016-06-25 | DRG: 470 | Disposition: A | Discharge status: Home Health | Payer: PPO | Source: Ambulatory Visit | Attending: Orthopedic Surgery | Admitting: Orthopedic Surgery

## 2016-06-24 ENCOUNTER — Inpatient Hospital Stay (HOSPITAL_COMMUNITY): Payer: PPO | Admitting: Anesthesiology

## 2016-06-24 ENCOUNTER — Inpatient Hospital Stay (HOSPITAL_COMMUNITY): Payer: PPO

## 2016-06-24 ENCOUNTER — Encounter (HOSPITAL_COMMUNITY): Payer: Self-pay | Admitting: Anesthesiology

## 2016-06-24 DIAGNOSIS — K5790 Diverticulosis of intestine, part unspecified, without perforation or abscess without bleeding: Secondary | ICD-10-CM | POA: Diagnosis not present

## 2016-06-24 DIAGNOSIS — M1611 Unilateral primary osteoarthritis, right hip: Secondary | ICD-10-CM | POA: Diagnosis not present

## 2016-06-24 DIAGNOSIS — Z880 Allergy status to penicillin: Secondary | ICD-10-CM | POA: Diagnosis not present

## 2016-06-24 DIAGNOSIS — G629 Polyneuropathy, unspecified: Secondary | ICD-10-CM | POA: Diagnosis present

## 2016-06-24 DIAGNOSIS — Z96651 Presence of right artificial knee joint: Secondary | ICD-10-CM | POA: Diagnosis present

## 2016-06-24 DIAGNOSIS — Z973 Presence of spectacles and contact lenses: Secondary | ICD-10-CM | POA: Diagnosis not present

## 2016-06-24 DIAGNOSIS — M8580 Other specified disorders of bone density and structure, unspecified site: Secondary | ICD-10-CM | POA: Diagnosis not present

## 2016-06-24 DIAGNOSIS — M25751 Osteophyte, right hip: Secondary | ICD-10-CM | POA: Diagnosis present

## 2016-06-24 DIAGNOSIS — Z79899 Other long term (current) drug therapy: Secondary | ICD-10-CM | POA: Diagnosis not present

## 2016-06-24 DIAGNOSIS — E7800 Pure hypercholesterolemia, unspecified: Secondary | ICD-10-CM | POA: Diagnosis not present

## 2016-06-24 DIAGNOSIS — J45909 Unspecified asthma, uncomplicated: Secondary | ICD-10-CM | POA: Diagnosis not present

## 2016-06-24 DIAGNOSIS — E559 Vitamin D deficiency, unspecified: Secondary | ICD-10-CM | POA: Diagnosis present

## 2016-06-24 DIAGNOSIS — Z885 Allergy status to narcotic agent status: Secondary | ICD-10-CM

## 2016-06-24 DIAGNOSIS — Z09 Encounter for follow-up examination after completed treatment for conditions other than malignant neoplasm: Secondary | ICD-10-CM

## 2016-06-24 DIAGNOSIS — Z882 Allergy status to sulfonamides status: Secondary | ICD-10-CM | POA: Diagnosis not present

## 2016-06-24 DIAGNOSIS — Z96641 Presence of right artificial hip joint: Secondary | ICD-10-CM | POA: Diagnosis not present

## 2016-06-24 DIAGNOSIS — E669 Obesity, unspecified: Secondary | ICD-10-CM | POA: Diagnosis present

## 2016-06-24 DIAGNOSIS — Z881 Allergy status to other antibiotic agents status: Secondary | ICD-10-CM

## 2016-06-24 DIAGNOSIS — I10 Essential (primary) hypertension: Secondary | ICD-10-CM | POA: Diagnosis present

## 2016-06-24 DIAGNOSIS — F418 Other specified anxiety disorders: Secondary | ICD-10-CM | POA: Diagnosis not present

## 2016-06-24 DIAGNOSIS — M797 Fibromyalgia: Secondary | ICD-10-CM | POA: Diagnosis not present

## 2016-06-24 DIAGNOSIS — M25551 Pain in right hip: Secondary | ICD-10-CM | POA: Diagnosis not present

## 2016-06-24 DIAGNOSIS — Z86718 Personal history of other venous thrombosis and embolism: Secondary | ICD-10-CM

## 2016-06-24 DIAGNOSIS — Z6837 Body mass index (BMI) 37.0-37.9, adult: Secondary | ICD-10-CM | POA: Diagnosis not present

## 2016-06-24 DIAGNOSIS — Z471 Aftercare following joint replacement surgery: Secondary | ICD-10-CM | POA: Diagnosis not present

## 2016-06-24 DIAGNOSIS — Z419 Encounter for procedure for purposes other than remedying health state, unspecified: Secondary | ICD-10-CM

## 2016-06-24 DIAGNOSIS — M169 Osteoarthritis of hip, unspecified: Secondary | ICD-10-CM | POA: Diagnosis not present

## 2016-06-24 HISTORY — PX: TOTAL HIP ARTHROPLASTY: SHX124

## 2016-06-24 LAB — TYPE AND SCREEN
ABO/RH(D): O NEG
Antibody Screen: POSITIVE

## 2016-06-24 SURGERY — ARTHROPLASTY, HIP, TOTAL, ANTERIOR APPROACH
Anesthesia: General | Site: Hip | Laterality: Right

## 2016-06-24 MED ORDER — KETOROLAC TROMETHAMINE 15 MG/ML IJ SOLN
7.5000 mg | Freq: Four times a day (QID) | INTRAMUSCULAR | Status: AC
  Administered 2016-06-24 – 2016-06-25 (×4): 7.5 mg via INTRAVENOUS
  Filled 2016-06-24 (×4): qty 1

## 2016-06-24 MED ORDER — ACETAMINOPHEN 325 MG PO TABS: 650.0000 mg | ORAL_TABLET | Freq: Four times a day (QID) | ORAL | Status: DC | PRN

## 2016-06-24 MED ORDER — VITAMIN D3 25 MCG (1000 UNIT) PO TABS
2000.0000 [IU] | ORAL_TABLET | Freq: Every day | ORAL | Status: DC
  Administered 2016-06-24 – 2016-06-25 (×2): 2000 [IU] via ORAL
  Filled 2016-06-24 (×3): qty 2

## 2016-06-24 MED ORDER — METHOCARBAMOL 1000 MG/10ML IJ SOLN
500.0000 mg | Freq: Four times a day (QID) | INTRAVENOUS | Status: DC | PRN
  Filled 2016-06-24: qty 5

## 2016-06-24 MED ORDER — TRAZODONE HCL 50 MG PO TABS: 50.0000 mg | ORAL_TABLET | Freq: Every evening | ORAL | Status: DC | PRN

## 2016-06-24 MED ORDER — DULOXETINE HCL 60 MG PO CPEP
60.0000 mg | ORAL_CAPSULE | Freq: Every evening | ORAL | Status: DC
  Administered 2016-06-24: 60 mg via ORAL
  Filled 2016-06-24: qty 1

## 2016-06-24 MED ORDER — FENTANYL CITRATE (PF) 100 MCG/2ML IJ SOLN
INTRAMUSCULAR | Status: AC
  Filled 2016-06-24: qty 2

## 2016-06-24 MED ORDER — LACTATED RINGERS IV SOLN
INTRAVENOUS | Status: DC | PRN
  Administered 2016-06-24 (×2): via INTRAVENOUS

## 2016-06-24 MED ORDER — VANCOMYCIN HCL IN DEXTROSE 1-5 GM/200ML-% IV SOLN
1000.0000 mg | Freq: Two times a day (BID) | INTRAVENOUS | Status: AC
  Administered 2016-06-25: 1000 mg via INTRAVENOUS
  Filled 2016-06-24: qty 200

## 2016-06-24 MED ORDER — ARTIFICIAL TEARS OP OINT
TOPICAL_OINTMENT | OPHTHALMIC | Status: AC
  Filled 2016-06-24: qty 3.5

## 2016-06-24 MED ORDER — PROMETHAZINE HCL 25 MG/ML IJ SOLN: 6.2500 mg | INTRAMUSCULAR | Status: DC | PRN

## 2016-06-24 MED ORDER — SODIUM CHLORIDE 0.9 % IV SOLN: INTRAVENOUS | Status: DC

## 2016-06-24 MED ORDER — DEXAMETHASONE SODIUM PHOSPHATE 10 MG/ML IJ SOLN
INTRAMUSCULAR | Status: AC
  Filled 2016-06-24: qty 1

## 2016-06-24 MED ORDER — MENTHOL 3 MG MT LOZG: 1.0000 | LOZENGE | OROMUCOSAL | Status: DC | PRN

## 2016-06-24 MED ORDER — BUPIVACAINE-EPINEPHRINE (PF) 0.5% -1:200000 IJ SOLN
INTRAMUSCULAR | Status: AC
  Filled 2016-06-24: qty 30

## 2016-06-24 MED ORDER — METOCLOPRAMIDE HCL 5 MG PO TABS: 5.0000 mg | ORAL_TABLET | Freq: Three times a day (TID) | ORAL | Status: DC | PRN

## 2016-06-24 MED ORDER — ONDANSETRON HCL 4 MG PO TABS: 4.0000 mg | ORAL_TABLET | Freq: Four times a day (QID) | ORAL | Status: DC | PRN

## 2016-06-24 MED ORDER — AMLODIPINE BESYLATE 10 MG PO TABS
10.0000 mg | ORAL_TABLET | Freq: Every evening | ORAL | Status: DC
  Administered 2016-06-24: 10 mg via ORAL
  Filled 2016-06-24: qty 1

## 2016-06-24 MED ORDER — BUDESONIDE 0.5 MG/2ML IN SUSP
0.5000 mg | Freq: Two times a day (BID) | RESPIRATORY_TRACT | Status: DC
  Administered 2016-06-24 – 2016-06-25 (×2): 0.5 mg via RESPIRATORY_TRACT
  Filled 2016-06-24 (×2): qty 2

## 2016-06-24 MED ORDER — SUGAMMADEX SODIUM 200 MG/2ML IV SOLN
INTRAVENOUS | Status: DC | PRN
Start: 2016-06-24 — End: 2016-06-24
  Administered 2016-06-24: 200 mg via INTRAVENOUS

## 2016-06-24 MED ORDER — DULOXETINE HCL 60 MG PO CPEP: 60.0000 mg | ORAL_CAPSULE | Freq: Every evening | ORAL | Status: DC

## 2016-06-24 MED ORDER — MIDAZOLAM HCL 5 MG/5ML IJ SOLN
INTRAMUSCULAR | Status: DC | PRN
  Administered 2016-06-24: 2 mg via INTRAVENOUS

## 2016-06-24 MED ORDER — TOPIRAMATE 25 MG PO TABS
75.0000 mg | ORAL_TABLET | Freq: Every day | ORAL | Status: DC
  Administered 2016-06-24: 75 mg via ORAL
  Filled 2016-06-24: qty 3

## 2016-06-24 MED ORDER — PROPOFOL 10 MG/ML IV BOLUS
INTRAVENOUS | Status: AC
  Filled 2016-06-24: qty 40

## 2016-06-24 MED ORDER — ATORVASTATIN CALCIUM 20 MG PO TABS
20.0000 mg | ORAL_TABLET | Freq: Every evening | ORAL | Status: DC
  Administered 2016-06-24: 20 mg via ORAL
  Filled 2016-06-24: qty 1

## 2016-06-24 MED ORDER — PHENOL 1.4 % MT LIQD
1.0000 | OROMUCOSAL | Status: DC | PRN
Start: 2016-06-24 — End: 2016-06-25

## 2016-06-24 MED ORDER — ACETAMINOPHEN 10 MG/ML IV SOLN
INTRAVENOUS | Status: AC
  Filled 2016-06-24: qty 100

## 2016-06-24 MED ORDER — ROCURONIUM BROMIDE 100 MG/10ML IV SOLN
INTRAVENOUS | Status: DC | PRN
  Administered 2016-06-24: 50 mg via INTRAVENOUS
  Administered 2016-06-24: 20 mg via INTRAVENOUS

## 2016-06-24 MED ORDER — CHLORHEXIDINE GLUCONATE 4 % EX LIQD: 60.0000 mL | Freq: Once | CUTANEOUS | Status: DC

## 2016-06-24 MED ORDER — PROPOFOL 10 MG/ML IV BOLUS
INTRAVENOUS | Status: DC | PRN
  Administered 2016-06-24: 130 mg via INTRAVENOUS

## 2016-06-24 MED ORDER — ONDANSETRON HCL 4 MG/2ML IJ SOLN: 4.0000 mg | Freq: Four times a day (QID) | INTRAMUSCULAR | Status: DC | PRN

## 2016-06-24 MED ORDER — ONDANSETRON HCL 4 MG/2ML IJ SOLN
INTRAMUSCULAR | Status: DC | PRN
  Administered 2016-06-24: 4 mg via INTRAVENOUS

## 2016-06-24 MED ORDER — SUGAMMADEX SODIUM 200 MG/2ML IV SOLN
INTRAVENOUS | Status: AC
  Filled 2016-06-24: qty 2

## 2016-06-24 MED ORDER — KETOROLAC TROMETHAMINE 30 MG/ML IJ SOLN
INTRAMUSCULAR | Status: DC | PRN
  Administered 2016-06-24: 30 mg via INTRAMUSCULAR

## 2016-06-24 MED ORDER — SODIUM CHLORIDE 0.9 % IR SOLN
Status: DC | PRN
  Administered 2016-06-24: 1000 mL

## 2016-06-24 MED ORDER — LACTATED RINGERS IV SOLN
INTRAVENOUS | Status: DC
  Administered 2016-06-24: 13:00:00 via INTRAVENOUS

## 2016-06-24 MED ORDER — HYDROMORPHONE HCL 2 MG/ML IJ SOLN
INTRAMUSCULAR | Status: AC
  Administered 2016-06-24: 0.5 mg
  Filled 2016-06-24: qty 1

## 2016-06-24 MED ORDER — SODIUM CHLORIDE 0.9 % IR SOLN
Status: DC | PRN
  Administered 2016-06-24: 3000 mL

## 2016-06-24 MED ORDER — BENAZEPRIL HCL 20 MG PO TABS: 20.0000 mg | ORAL_TABLET | Freq: Every evening | ORAL | Status: DC

## 2016-06-24 MED ORDER — FENTANYL CITRATE (PF) 100 MCG/2ML IJ SOLN
INTRAMUSCULAR | Status: DC | PRN
  Administered 2016-06-24: 100 ug via INTRAVENOUS
  Administered 2016-06-24: 150 ug via INTRAVENOUS
  Administered 2016-06-24: 50 ug via INTRAVENOUS

## 2016-06-24 MED ORDER — ONDANSETRON HCL 4 MG/2ML IJ SOLN
INTRAMUSCULAR | Status: AC
Start: 2016-06-24 — End: 2016-06-24
  Filled 2016-06-24: qty 2

## 2016-06-24 MED ORDER — FENTANYL CITRATE (PF) 100 MCG/2ML IJ SOLN
INTRAMUSCULAR | Status: AC
  Filled 2016-06-24: qty 4

## 2016-06-24 MED ORDER — ARTIFICIAL TEARS OP OINT
TOPICAL_OINTMENT | OPHTHALMIC | Status: DC | PRN
  Administered 2016-06-24: 1 via OPHTHALMIC

## 2016-06-24 MED ORDER — LIDOCAINE HCL (CARDIAC) 20 MG/ML IV SOLN
INTRAVENOUS | Status: DC | PRN
  Administered 2016-06-24: 100 mg via INTRAVENOUS

## 2016-06-24 MED ORDER — METHOCARBAMOL 500 MG PO TABS
500.0000 mg | ORAL_TABLET | Freq: Four times a day (QID) | ORAL | Status: DC | PRN
  Administered 2016-06-25: 500 mg via ORAL
  Filled 2016-06-24: qty 1

## 2016-06-24 MED ORDER — POVIDONE-IODINE 10 % EX SOLN
CUTANEOUS | Status: DC | PRN
  Administered 2016-06-24: 1 via TOPICAL

## 2016-06-24 MED ORDER — ACETAMINOPHEN 650 MG RE SUPP: 650.0000 mg | Freq: Four times a day (QID) | RECTAL | Status: DC | PRN

## 2016-06-24 MED ORDER — HYDROMORPHONE HCL 1 MG/ML IJ SOLN
0.2500 mg | INTRAMUSCULAR | Status: DC | PRN
  Administered 2016-06-24 (×2): 0.5 mg via INTRAVENOUS

## 2016-06-24 MED ORDER — ROCURONIUM BROMIDE 10 MG/ML (PF) SYRINGE
PREFILLED_SYRINGE | INTRAVENOUS | Status: AC
Start: 2016-06-24 — End: 2016-06-24
  Filled 2016-06-24: qty 10

## 2016-06-24 MED ORDER — ALBUTEROL SULFATE (2.5 MG/3ML) 0.083% IN NEBU: 2.5000 mg | INHALATION_SOLUTION | Freq: Four times a day (QID) | RESPIRATORY_TRACT | Status: DC | PRN

## 2016-06-24 MED ORDER — ZINC 50 MG PO TABS: 50.0000 mg | ORAL_TABLET | Freq: Every day | ORAL | Status: DC

## 2016-06-24 MED ORDER — SODIUM CHLORIDE 0.9 % IJ SOLN
INTRAMUSCULAR | Status: DC | PRN
  Administered 2016-06-24: 30 mL

## 2016-06-24 MED ORDER — DIPHENHYDRAMINE HCL 12.5 MG/5ML PO ELIX: 12.5000 mg | ORAL_SOLUTION | ORAL | Status: DC | PRN

## 2016-06-24 MED ORDER — HYDROMORPHONE HCL 2 MG/ML IJ SOLN
0.5000 mg | INTRAMUSCULAR | Status: DC | PRN
  Administered 2016-06-24: 0.5 mg via INTRAVENOUS
  Filled 2016-06-24: qty 1

## 2016-06-24 MED ORDER — MIDAZOLAM HCL 2 MG/2ML IJ SOLN
INTRAMUSCULAR | Status: AC
  Filled 2016-06-24: qty 2

## 2016-06-24 MED ORDER — SENNA 8.6 MG PO TABS
2.0000 | ORAL_TABLET | Freq: Every day | ORAL | Status: DC
  Administered 2016-06-24: 17.2 mg via ORAL
  Filled 2016-06-24: qty 2

## 2016-06-24 MED ORDER — DOCUSATE SODIUM 100 MG PO CAPS
100.0000 mg | ORAL_CAPSULE | Freq: Two times a day (BID) | ORAL | Status: DC
  Administered 2016-06-24 – 2016-06-25 (×2): 100 mg via ORAL
  Filled 2016-06-24 (×2): qty 1

## 2016-06-24 MED ORDER — LIDOCAINE 2% (20 MG/ML) 5 ML SYRINGE
INTRAMUSCULAR | Status: AC
  Filled 2016-06-24: qty 5

## 2016-06-24 MED ORDER — BUPIVACAINE-EPINEPHRINE 0.5% -1:200000 IJ SOLN
INTRAMUSCULAR | Status: DC | PRN
  Administered 2016-06-24: 30 mL

## 2016-06-24 MED ORDER — KETOROLAC TROMETHAMINE 30 MG/ML IJ SOLN
INTRAMUSCULAR | Status: AC
  Filled 2016-06-24: qty 1

## 2016-06-24 MED ORDER — 0.9 % SODIUM CHLORIDE (POUR BTL) OPTIME
TOPICAL | Status: DC | PRN
  Administered 2016-06-24: 1000 mL

## 2016-06-24 MED ORDER — PHENYLEPHRINE HCL 10 MG/ML IJ SOLN
INTRAVENOUS | Status: DC | PRN
  Administered 2016-06-24: 20 ug/min via INTRAVENOUS

## 2016-06-24 MED ORDER — BIOTIN 5000 MCG PO CAPS: 10000.0000 ug | ORAL_CAPSULE | Freq: Every day | ORAL | Status: DC

## 2016-06-24 MED ORDER — DEXTROSE 5 % IV SOLN
INTRAVENOUS | Status: DC | PRN
  Administered 2016-06-24: 14:00:00 via INTRAVENOUS

## 2016-06-24 MED ORDER — HYDROMORPHONE HCL 2 MG PO TABS
1.0000 mg | ORAL_TABLET | ORAL | Status: DC | PRN
  Administered 2016-06-25 (×3): 1 mg via ORAL
  Filled 2016-06-24 (×3): qty 1

## 2016-06-24 MED ORDER — APIXABAN 2.5 MG PO TABS
2.5000 mg | ORAL_TABLET | Freq: Two times a day (BID) | ORAL | Status: DC
  Administered 2016-06-25: 2.5 mg via ORAL
  Filled 2016-06-24: qty 1

## 2016-06-24 MED ORDER — DEXAMETHASONE SODIUM PHOSPHATE 10 MG/ML IJ SOLN
INTRAMUSCULAR | Status: DC | PRN
  Administered 2016-06-24: 10 mg via INTRAVENOUS

## 2016-06-24 MED ORDER — VANCOMYCIN HCL IN DEXTROSE 1-5 GM/200ML-% IV SOLN
1000.0000 mg | INTRAVENOUS | Status: AC
  Administered 2016-06-24: 1000 mg via INTRAVENOUS
  Filled 2016-06-24: qty 200

## 2016-06-24 MED ORDER — BENAZEPRIL HCL 20 MG PO TABS
20.0000 mg | ORAL_TABLET | Freq: Every evening | ORAL | Status: DC
  Administered 2016-06-24: 20 mg via ORAL
  Filled 2016-06-24: qty 1

## 2016-06-24 MED ORDER — ATORVASTATIN CALCIUM 20 MG PO TABS: 20.0000 mg | ORAL_TABLET | Freq: Every evening | ORAL | Status: DC

## 2016-06-24 MED ORDER — AMLODIPINE BESYLATE 10 MG PO TABS: 10.0000 mg | ORAL_TABLET | Freq: Every evening | ORAL | Status: DC

## 2016-06-24 MED ORDER — METOCLOPRAMIDE HCL 5 MG/ML IJ SOLN: 5.0000 mg | Freq: Three times a day (TID) | INTRAMUSCULAR | Status: DC | PRN

## 2016-06-24 SURGICAL SUPPLY — 50 items
ADH SKN CLS APL DERMABOND .7 (GAUZE/BANDAGES/DRESSINGS) ×1
ALCOHOL ISOPROPYL (RUBBING) (MISCELLANEOUS) ×3 IMPLANT
BLADE SURG ROTATE 9660 (MISCELLANEOUS) ×3 IMPLANT
CAPT HIP TOTAL 2 ×3 IMPLANT
CHLORAPREP W/TINT 26ML (MISCELLANEOUS) ×3 IMPLANT
COVER SURGICAL LIGHT HANDLE (MISCELLANEOUS) ×3 IMPLANT
DERMABOND ADVANCED (GAUZE/BANDAGES/DRESSINGS) ×2
DERMABOND ADVANCED .7 DNX12 (GAUZE/BANDAGES/DRESSINGS) ×1 IMPLANT
DRAPE C-ARM 42X72 X-RAY (DRAPES) ×3 IMPLANT
DRAPE IMP U-DRAPE 54X76 (DRAPES) ×3 IMPLANT
DRAPE STERI IOBAN 125X83 (DRAPES) ×3 IMPLANT
DRAPE U-SHAPE 47X51 STRL (DRAPES) ×6 IMPLANT
DRSG AQUACEL AG ADV 3.5X10 (GAUZE/BANDAGES/DRESSINGS) ×3 IMPLANT
ELECT BLADE 4.0 EZ CLEAN MEGAD (MISCELLANEOUS) ×3
ELECT REM PT RETURN 9FT ADLT (ELECTROSURGICAL) ×3
ELECTRODE BLDE 4.0 EZ CLN MEGD (MISCELLANEOUS) ×1 IMPLANT
ELECTRODE REM PT RTRN 9FT ADLT (ELECTROSURGICAL) ×1 IMPLANT
EVACUATOR 1/8 PVC DRAIN (DRAIN) IMPLANT
GLOVE BIO SURGEON STRL SZ8.5 (GLOVE) ×6 IMPLANT
GLOVE BIOGEL PI IND STRL 8.5 (GLOVE) ×1 IMPLANT
GLOVE BIOGEL PI INDICATOR 8.5 (GLOVE) ×2
GOWN STRL REUS W/ TWL LRG LVL3 (GOWN DISPOSABLE) ×2 IMPLANT
GOWN STRL REUS W/TWL 2XL LVL3 (GOWN DISPOSABLE) ×6 IMPLANT
GOWN STRL REUS W/TWL LRG LVL3 (GOWN DISPOSABLE) ×6
HANDPIECE INTERPULSE COAX TIP (DISPOSABLE) ×3
HOOD PEEL AWAY FACE SHEILD DIS (HOOD) ×6 IMPLANT
KIT BASIN OR (CUSTOM PROCEDURE TRAY) ×3 IMPLANT
KIT ROOM TURNOVER OR (KITS) ×3 IMPLANT
MANIFOLD NEPTUNE II (INSTRUMENTS) ×3 IMPLANT
MARKER SKIN DUAL TIP RULER LAB (MISCELLANEOUS) ×6 IMPLANT
NDL SPNL 18GX3.5 QUINCKE PK (NEEDLE) ×1 IMPLANT
NEEDLE SPNL 18GX3.5 QUINCKE PK (NEEDLE) ×3 IMPLANT
NS IRRIG 1000ML POUR BTL (IV SOLUTION) ×3 IMPLANT
PACK TOTAL JOINT (CUSTOM PROCEDURE TRAY) ×3 IMPLANT
PACK UNIVERSAL I (CUSTOM PROCEDURE TRAY) ×3 IMPLANT
PAD ARMBOARD 7.5X6 YLW CONV (MISCELLANEOUS) ×9 IMPLANT
SAW OSC TIP CART 19.5X105X1.3 (SAW) ×3 IMPLANT
SEALER BIPOLAR AQUA 6.0 (INSTRUMENTS) ×2 IMPLANT
SET HNDPC FAN SPRY TIP SCT (DISPOSABLE) ×1 IMPLANT
SOLUTION BETADINE 4OZ (MISCELLANEOUS) ×3 IMPLANT
SUT ETHIBOND NAB CT1 #1 30IN (SUTURE) ×3 IMPLANT
SUT MNCRL AB 3-0 PS2 18 (SUTURE) ×3 IMPLANT
SUT MON AB 2-0 CT1 36 (SUTURE) ×6 IMPLANT
SUT VIC AB 1 CT1 27 (SUTURE) ×3
SUT VIC AB 1 CT1 27XBRD ANBCTR (SUTURE) ×1 IMPLANT
SUT VIC AB 2-0 CT1 27 (SUTURE) ×3
SUT VIC AB 2-0 CT1 TAPERPNT 27 (SUTURE) ×1 IMPLANT
SUT VLOC 180 0 24IN GS25 (SUTURE) ×3 IMPLANT
SYR 50ML LL SCALE MARK (SYRINGE) ×3 IMPLANT
TOWEL OR 17X24 6PK STRL BLUE (TOWEL DISPOSABLE) ×3 IMPLANT

## 2016-06-24 NOTE — Transfer of Care (Signed)
Immediate Anesthesia Transfer of Care Note  Patient: Evelyn Valdez  Procedure(s) Performed: Procedure(s): RIGHT TOTAL HIP ARTHROPLASTY ANTERIOR APPROACH (Right)  Patient Location: PACU  Anesthesia Type:General  Level of Consciousness: awake, oriented, sedated, patient cooperative and responds to stimulation  Airway & Oxygen Therapy: Patient Spontanous Breathing and Patient connected to nasal cannula oxygen  Post-op Assessment: Report given to RN, Post -op Vital signs reviewed and stable, Patient moving all extremities and Patient moving all extremities X 4  Post vital signs: Reviewed and stable  Last Vitals:  Vitals:   06/24/16 0942 06/24/16 1633  BP: 136/66   Pulse: 89   Resp: 20   Temp: 36.7 C (P) 36.6 C    Last Pain:  Vitals:   06/24/16 1253  TempSrc:   PainSc: 7          Complications: No apparent anesthesia complications

## 2016-06-24 NOTE — Anesthesia Preprocedure Evaluation (Addendum)
Anesthesia Evaluation  Patient identified by MRN, date of birth, ID band Patient awake    Reviewed: Allergy & Precautions, NPO status , Patient's Chart, lab work & pertinent test results  Airway Mallampati: II  TM Distance: >3 FB Neck ROM: Full    Dental   Pulmonary asthma ,    breath sounds clear to auscultation       Cardiovascular hypertension, Pt. on medications  Rhythm:Regular Rate:Normal     Neuro/Psych Anxiety Depression  Neuromuscular disease    GI/Hepatic negative GI ROS, Neg liver ROS,   Endo/Other  negative endocrine ROS  Renal/GU negative Renal ROS     Musculoskeletal  (+) Arthritis ,   Abdominal   Peds  Hematology negative hematology ROS (+)   Anesthesia Other Findings   Reproductive/Obstetrics                             Lab Results  Component Value Date   WBC 7.5 06/13/2016   HGB 13.4 06/13/2016   HCT 42.6 06/13/2016   MCV 88.2 06/13/2016   PLT 229 06/13/2016   Lab Results  Component Value Date   CREATININE 0.84 06/13/2016   BUN 13 06/13/2016   NA 139 06/13/2016   K 4.1 06/13/2016   CL 110 06/13/2016   CO2 23 06/13/2016   No results found for: INR, PROTIME  Anesthesia Physical Anesthesia Plan  ASA: II  Anesthesia Plan: General   Post-op Pain Management:    Induction: Intravenous  Airway Management Planned: Oral ETT  Additional Equipment:   Intra-op Plan:   Post-operative Plan: Extubation in OR  Informed Consent: I have reviewed the patients History and Physical, chart, labs and discussed the procedure including the risks, benefits and alternatives for the proposed anesthesia with the patient or authorized representative who has indicated his/her understanding and acceptance.   Dental advisory given  Plan Discussed with: CRNA  Anesthesia Plan Comments: (Pt elects for GA and declines spinal due to spinal stenosis.)       Anesthesia Quick  Evaluation

## 2016-06-24 NOTE — Interval H&P Note (Signed)
History and Physical Interval Note:  06/24/2016 12:40 PM  Evelyn Valdez  has presented today for surgery, with the diagnosis of RIGHT HIP OA  The various methods of treatment have been discussed with the patient and family. After consideration of risks, benefits and other options for treatment, the patient has consented to  Procedure(s): RIGHT TOTAL HIP ARTHROPLASTY ANTERIOR APPROACH (Right) as a surgical intervention .  The patient's history has been reviewed, patient examined, no change in status, stable for surgery.  I have reviewed the patient's chart and labs.  Questions were answered to the patient's satisfaction.     Belladonna Lubinski, Horald Pollen

## 2016-06-24 NOTE — Progress Notes (Signed)
Patient admitted to and oriented to  Room  5n15, sleepy but easy to arouse ,VSS call bell with in reach

## 2016-06-24 NOTE — H&P (View-Only) (Signed)
TOTAL HIP ADMISSION H&P  Patient is admitted for right total hip arthroplasty.  Subjective:  Chief Complaint: right hip pain  HPI: Evelyn Valdez, 75 y.o. female, has a history of pain and functional disability in the right hip(s) due to trauma and patient has failed non-surgical conservative treatments for greater than 12 weeks to include NSAID's and/or analgesics, flexibility and strengthening excercises, weight reduction as appropriate and activity modification.  Onset of symptoms was gradual starting >10 years ago with gradually worsening course since that time.The patient noted no past surgery on the right hip(s).  Patient currently rates pain in the right hip at 10 out of 10 with activity. Patient has night pain, worsening of pain with activity and weight bearing, pain that interfers with activities of daily living, pain with passive range of motion and crepitus. Patient has evidence of subchondral cysts, subchondral sclerosis, periarticular osteophytes and joint space narrowing by imaging studies. This condition presents safety issues increasing the risk of falls. There is no current active infection.  There are no active problems to display for this patient.  Past Medical History:  Diagnosis Date  . Anxiety   . Asthma    "well controlled"  . Depression   . Diverticulosis   . Dizziness   . DVT (deep venous thrombosis) ([place])    right lower leg after knee surgery  . Dyspnea    with exertion  . Fibromyalgia   . High cholesterol   . History of anemia   . History of bronchitis   . History of kidney stones   . History of pneumonia   . Hypertension   . Insomnia   . Obesity   . Osteoarthritis   . Osteopenia   . Peripheral neuropathy (HCC)    takes Topamax  . Vitamin D deficiency   . Wears glasses     Past Surgical History:  Procedure Laterality Date  . ABDOMINAL HYSTERECTOMY    . APPENDECTOMY    . BACK SURGERY    . BREAST BIOPSY Right    x2  . BREAST LUMPECTOMY Left    . CHOLECYSTECTOMY    . COLONOSCOPY W/ POLYPECTOMY    . ESOPHAGOGASTRODUODENOSCOPY    . EYE SURGERY Bilateral    cataracts  . FOOT FRACTURE SURGERY Left   . KNEE ARTHROPLASTY Right   . KNEE CARTILAGE SURGERY Right    x3  . MENISCUS REPAIR Left   . PLANTAR FASCIA SURGERY Bilateral   . RECTOCELE REPAIR    . SHOULDER SURGERY Right   . TUBAL LIGATION       (Not in a hospital admission) Allergies  Allergen Reactions  . Advair Diskus [Fluticasone-Salmeterol] Other (See Comments)    headaches  . Erythromycin Nausea And Vomiting  . Hydrocodone Nausea And Vomiting  . Morphine And Related Nausea And Vomiting  . Percocet [Oxycodone-Acetaminophen] Nausea And Vomiting  . Sulfa Antibiotics Nausea And Vomiting  . Penicillins Swelling and Rash    Has patient had a PCN reaction causing immediate rash, facial/tongue/throat swelling, SOB or lightheadedness with hypotension: Yes Has patient had a PCN reaction causing severe rash involving mucus membranes or skin necrosis: No Has patient had a PCN reaction that required hospitalization Yes Has patient had a PCN reaction occurring within the last 10 years: No If all of the above answers are "NO", then may proceed with Cephalosporin use.     Social History  Substance Use Topics  . Smoking status: Never Smoker  . Smokeless tobacco: Never Used  .  Alcohol use No    No family history on file.   Review of Systems  Constitutional: Negative.   HENT: Negative.   Eyes: Negative.   Respiratory: Positive for shortness of breath.   Cardiovascular: Negative.   Gastrointestinal: Negative.   Genitourinary: Negative.   Musculoskeletal: Positive for back pain and joint pain.  Skin: Negative.   Neurological: Negative.   Endo/Heme/Allergies: Negative.   Psychiatric/Behavioral: Negative.     Objective:  Physical Exam  Constitutional: She is oriented to person, place, and time. She appears well-developed and well-nourished.  HENT:  Head:  Normocephalic and atraumatic.  Eyes: Conjunctivae and EOM are normal. Pupils are equal, round, and reactive to light.  Neck: Normal range of motion. Neck supple.  Cardiovascular: Normal rate, regular rhythm and intact distal pulses.   Respiratory: Effort normal and breath sounds normal.  GI: Soft. Bowel sounds are normal. She exhibits no distension.  Genitourinary:  Genitourinary Comments: deferred  Musculoskeletal:       Right hip: She exhibits decreased range of motion.  Neurological: She is alert and oriented to person, place, and time. She has normal reflexes.  Skin: Skin is warm and dry.  Psychiatric: She has a normal mood and affect. Her behavior is normal. Judgment and thought content normal.    Vital signs in last 24 hours: @VSRANGES @  Labs:   Estimated body mass index is 37.98 kg/m as calculated from the following:   Height as of an earlier encounter on 06/13/16: 5\' 1"  (1.549 m).   Weight as of an earlier encounter on 06/13/16: 91.2 kg (201 lb).   Imaging Review Plain radiographs demonstrate severe degenerative joint disease of the right hip(s). The bone quality appears to be adequate for age and reported activity level.  Assessment/Plan:  End stage arthritis, right hip(s)  The patient history, physical examination, clinical judgement of the provider and imaging studies are consistent with end stage degenerative joint disease of the right hip(s) and total hip arthroplasty is deemed medically necessary. The treatment options including medical management, injection therapy, arthroscopy and arthroplasty were discussed at length. The risks and benefits of total hip arthroplasty were presented and reviewed. The risks due to aseptic loosening, infection, stiffness, dislocation/subluxation,  thromboembolic complications and other imponderables were discussed.  The patient acknowledged the explanation, agreed to proceed with the plan and consent was signed. Patient is being  admitted for inpatient treatment for surgery, pain control, PT, OT, prophylactic antibiotics, VTE prophylaxis, progressive ambulation and ADL's and discharge planning.The patient is planning to be discharged home with home health services. H/o DVT - apixaban

## 2016-06-24 NOTE — Op Note (Signed)
OPERATIVE REPORT  SURGEON: Rod Can, MD.  ASSISTANT: Ky Barban, RNFA.  PREOPERATIVE DIAGNOSIS: Right hip arthritis.   POSTOPERATIVE DIAGNOSIS: Right hip arthritis.   PROCEDURE: Right total hip arthroplasty, anterior approach.   IMPLANTS: Biomet Taperloc complete micro-plasty stem, size 12, standard offset. Biomet G7 acetabular shell, size 50 mm. Biomet E1 antioxidant liner, 36 +5 neutral. Biomet metal head ball, size 36 -6 mm.  ANESTHESIA:  General.  ESTIMATED BLOOD LOSS: 400 mL.  ANTIBIOTICS: 1 g vancomycin.  DRAINS: None.  COMPLICATIONS: None.   CONDITION: PACU - hemodynamically stable.   BRIEF CLINICAL NOTE: Evelyn Valdez is a 75 y.o. female with a long-standing history of Right hip arthritis. After failing conservative management, the patient was indicated for total hip arthroplasty. The risks, benefits, and alternatives to the procedure were explained, and the patient elected to proceed.  PROCEDURE IN DETAIL: Surgical site was marked by myself. Spinal anesthesia was obtained in the pre-op holding area. Once inside the operative room, a foley catheter was inserted. The patient was then positioned on the Hana table. All bony prominences were well padded. The hip was prepped and draped in the normal sterile surgical fashion. A time-out was called verifying side and site of surgery. The patient received IV antibiotics within 60 minutes of beginning the procedure.  The direct anterior approach to the hip was performed through the Hueter interval. Lateral femoral circumflex vessels were treated with the Auqumantys. The anterior capsule was exposed and an inverted T capsulotomy was made.The femoral neck cut was made to the level of the templated cut. A corkscrew was placed into the head and the head was removed. The femoral head was found to have eburnated bone. The head was passed to the back table and was measured.  Acetabular exposure was achieved,  and the pulvinar and labrum were excised. Sequental reaming of the acetabulum was then performed up to a size 49 mm reamer. A 50 mm cup was then opened and impacted into place at approximately 40 degrees of abduction and 20 degrees of anteversion. The final polyethylene liner was impacted into place and acetabular osteophytes were removed.   I then gained femoral exposure taking care to protect the abductors and greater trochanter. This was performed using standard external rotation, extension, and adduction. The capsule was peeled off the inner aspect of the greater trochanter, taking care to preserve the short external rotators. A cookie cutter was used to enter the femoral canal, and then the femoral canal finder was placed. Sequential broaching was performed up to a size 12. Calcar planer was used on the femoral neck remnant. I placed a std offset neck and a trial head ball. The hip was reduced. Leg lengths and offset were checked fluoroscopically. The hip was dislocated and trial components were removed. The final implants were placed, and the hip was reduced.  Fluoroscopy was used to confirm component position and leg lengths. At 90 degrees of external rotation and full extension, the hip was stable to an anterior directed force.  The wound was copiously irrigated with a dilute betadine solution followed by normal saline. Marcaine solution was injected into the periarticular soft tissue. The wound was closed in layers using #1 Vicryl and V-Loc for the fascia, 2-0 Vicryl for the subcutaneous fat, 2-0 Monocryl for the deep dermal layer, 3-0 running Monocryl subcuticular stitch, and Dermabond for the skin. Once the glue was fully dried, an Aquacell Ag dressing was applied. The patient was transported to the recovery room in stable  condition. Sponge, needle, and instrument counts were correct at the end of the case x2. The patient tolerated the procedure well and there were no known  complications.

## 2016-06-24 NOTE — Anesthesia Procedure Notes (Signed)
Procedure Name: Intubation Date/Time: 06/24/2016 1:29 PM Performed by: Jacquiline Doe A Pre-anesthesia Checklist: Patient identified, Emergency Drugs available, Suction available and Patient being monitored Patient Re-evaluated:Patient Re-evaluated prior to inductionOxygen Delivery Method: Circle System Utilized and Circle system utilized Preoxygenation: Pre-oxygenation with 100% oxygen Intubation Type: IV induction and Cricoid Pressure applied Ventilation: Mask ventilation without difficulty and Oral airway inserted - appropriate to patient size Laryngoscope Size: Mac and 4 Grade View: Grade I Tube type: Oral Tube size: 7.5 mm Number of attempts: 1 Airway Equipment and Method: Stylet and Oral airway Placement Confirmation: ETT inserted through vocal cords under direct vision,  positive ETCO2 and breath sounds checked- equal and bilateral Secured at: 22 cm Tube secured with: Tape Dental Injury: Teeth and Oropharynx as per pre-operative assessment

## 2016-06-25 ENCOUNTER — Encounter (HOSPITAL_COMMUNITY): Payer: Self-pay | Admitting: General Practice

## 2016-06-25 LAB — BASIC METABOLIC PANEL
ANION GAP: 8 (ref 5–15)
BUN: 13 mg/dL (ref 6–20)
CALCIUM: 8.5 mg/dL — AB (ref 8.9–10.3)
CO2: 21 mmol/L — ABNORMAL LOW (ref 22–32)
Chloride: 109 mmol/L (ref 101–111)
Creatinine, Ser: 0.72 mg/dL (ref 0.44–1.00)
GFR calc Af Amer: >60 mL/min (ref >60)
GLUCOSE: 121 mg/dL — AB (ref 65–99)
Potassium: 4.8 mmol/L (ref 3.5–5.1)
SODIUM: 138 mmol/L (ref 135–145)

## 2016-06-25 LAB — CBC
HCT: 35 % — ABNORMAL LOW (ref 36.0–46.0)
Hemoglobin: 10.8 g/dL — ABNORMAL LOW (ref 12.0–15.0)
MCH: 27.6 pg (ref 26.0–34.0)
MCHC: 30.9 g/dL (ref 30.0–36.0)
MCV: 89.5 fL (ref 78.0–100.0)
PLATELETS: 151 10*3/uL (ref 150–400)
RBC: 3.91 MIL/uL (ref 3.87–5.11)
RDW: 16.3 % — AB (ref 11.5–15.5)
WBC: 14 10*3/uL — AB (ref 4.0–10.5)

## 2016-06-25 MED ORDER — HYDROMORPHONE HCL 2 MG PO TABS: 1.0000 mg | ORAL_TABLET | ORAL | 0 refills | Status: DC | PRN

## 2016-06-25 MED ORDER — APIXABAN 2.5 MG PO TABS: 2.5000 mg | ORAL_TABLET | Freq: Two times a day (BID) | ORAL | 0 refills | Status: DC

## 2016-06-25 MED ORDER — METHOCARBAMOL 500 MG PO TABS: 500.0000 mg | ORAL_TABLET | Freq: Four times a day (QID) | ORAL | 0 refills | Status: DC | PRN

## 2016-06-25 MED ORDER — SENNA 8.6 MG PO TABS: 2.0000 | ORAL_TABLET | Freq: Every day | ORAL | 0 refills | Status: DC

## 2016-06-25 MED ORDER — ONDANSETRON HCL 4 MG PO TABS: 4.0000 mg | ORAL_TABLET | Freq: Four times a day (QID) | ORAL | 0 refills | Status: DC | PRN

## 2016-06-25 MED ORDER — DOCUSATE SODIUM 100 MG PO CAPS: 100.0000 mg | ORAL_CAPSULE | Freq: Two times a day (BID) | ORAL | 0 refills | Status: DC

## 2016-06-25 NOTE — Discharge Planning (Signed)
Patient discharged home in stable condition. Verbalizes understanding of all discharge instructions, including home medications and follow up appointments. 

## 2016-06-25 NOTE — Care Management Note (Signed)
Case Management Note  Patient Details  Name: [name] MRN: [id number] Date of Birth: 1940-11-20  Subjective/Objective:   75 yr old female s/p right total Hip arthroplasty.                 Action/Plan: Case manager spoke with patient and her husband concerning discharge needs. Patient was preoperatively setup with [facility], no changes. She has rolling walker and 3in1 at home, will have family support at discharge.  Expected Discharge Date:   06/25/16               Expected Discharge Plan:  [place]  In-House Referral:     Discharge planning Services  CM Consult  Post Acute Care Choice:  Home Health Choice offered to:  Patient  DME Arranged:  N/A DME Agency:  NA  HH Arranged:  PT [place] Agency:  [facility] (now [facility])  Status of Service:  Completed, signed off  If discussed at [employer] of Stay Meetings, dates discussed:    Additional Comments:  Ninfa Meeker, RN 06/25/2016, 3:07 PM

## 2016-06-25 NOTE — Discharge Summary (Signed)
Physician Discharge Summary  Patient ID: Evelyn Valdez MRN: [id number] DOB/AGE: Apr 13, 1941 75 y.o.  Admit date: 06/24/2016 Discharge date: 06/25/2016  Admission Diagnoses:  Osteoarthritis of right hip  Discharge Diagnoses:  Principal Problem:   Osteoarthritis of right hip Active Problems:   Primary osteoarthritis of right hip   Past Medical History:  Diagnosis Date  . Anxiety   . Asthma    "well controlled"  . Depression   . Diverticulosis   . Dizziness   . DVT (deep venous thrombosis) ([place])    right lower leg after knee surgery  . Dyspnea    with exertion  . Fibromyalgia   . High cholesterol   . History of anemia   . History of bronchitis   . History of kidney stones   . History of pneumonia   . Hypertension   . Insomnia   . Obesity   . Osteoarthritis   . Osteopenia   . Peripheral neuropathy (HCC)    takes Topamax  . Vitamin D deficiency   . Wears glasses     Surgeries: Procedure(s): RIGHT TOTAL HIP ARTHROPLASTY ANTERIOR APPROACH on 06/24/2016   Consultants (if any):   Discharged Condition: Improved  Hospital Course: Evelyn Valdez is an 75 y.o. female who was admitted 06/24/2016 with a diagnosis of Osteoarthritis of right hip and went to the operating room on 06/24/2016 and underwent the above named procedures.    She was given perioperative antibiotics:  Anti-infectives    Start     Dose/Rate Route Frequency Ordered Stop   06/25/16 0200  vancomycin (VANCOCIN) IVPB 1000 mg/200 mL premix     1,000 mg 200 mL/hr over 60 Minutes Intravenous Every 12 hours 06/24/16 1748 06/25/16 0210   06/24/16 1400  vancomycin (VANCOCIN) IVPB 1000 mg/200 mL premix     1,000 mg 200 mL/hr over 60 Minutes Intravenous To Surgery 06/24/16 1348 06/24/16 1445    .  She was given sequential compression devices, early ambulation, and apixaban for DVT prophylaxis.  She benefited maximally from the hospital stay and there were no complications.    Recent vital  signs:  Vitals:   06/25/16 0012 06/25/16 0500  BP: 122/70 125/74  Pulse: 82 79  Resp: 16 16  Temp: 97.1 F (36.2 C) 97.1 F (36.2 C)    Recent laboratory studies:  Lab Results  Component Value Date   HGB 13.4 06/13/2016   Lab Results  Component Value Date   WBC 7.5 06/13/2016   PLT 229 06/13/2016   No results found for: INR Lab Results  Component Value Date   NA 139 06/13/2016   K 4.1 06/13/2016   CL 110 06/13/2016   CO2 23 06/13/2016   BUN 13 06/13/2016   CREATININE 0.84 06/13/2016   GLUCOSE 107 (H) 06/13/2016    Discharge Medications:     Medication List    STOP taking these medications   acetaminophen 500 MG tablet Commonly known as:  TYLENOL     TAKE these medications   albuterol 108 (90 Base) MCG/ACT inhaler Commonly known as:  PROVENTIL HFA;VENTOLIN HFA Inhale 1-2 puffs into the lungs every 6 (six) hours as needed for wheezing or shortness of breath.   amLODipine 10 MG tablet Commonly known as:  NORVASC Take 10 mg by mouth every evening.   apixaban 2.5 MG Tabs tablet Commonly known as:  ELIQUIS Take 1 tablet (2.5 mg total) by mouth every 12 (twelve) hours.   ASMANEX 60 METERED DOSES 220 MCG/INH inhaler Generic  drug:  mometasone Inhale 2 puffs into the lungs 2 (two) times daily as needed (allergies).   atorvastatin 20 MG tablet Commonly known as:  LIPITOR Take 20 mg by mouth every evening.   B-12 COMPLIANCE INJECTION IJ Inject 1 Dose as directed every 30 (thirty) days.   B-12 PO Take 2 tablets by mouth daily.   benazepril 20 MG tablet Commonly known as:  LOTENSIN Take 20 mg by mouth every evening.   Biotin 5000 MCG Caps Take 10,000 mcg by mouth daily.   docusate sodium 100 MG capsule Commonly known as:  COLACE Take 1 capsule (100 mg total) by mouth 2 (two) times daily.   DULoxetine 30 MG capsule Commonly known as:  CYMBALTA Take 60 mg by mouth every evening.   HYDROmorphone 2 MG tablet Commonly known as:  DILAUDID Take 0.5  tablets (1 mg total) by mouth every 3 (three) hours as needed for severe pain.   methocarbamol 500 MG tablet Commonly known as:  ROBAXIN Take 1 tablet (500 mg total) by mouth every 6 (six) hours as needed for muscle spasms.   ondansetron 4 MG tablet Commonly known as:  ZOFRAN Take 1 tablet (4 mg total) by mouth every 6 (six) hours as needed for nausea.   senna 8.6 MG Tabs tablet Commonly known as:  SENOKOT Take 2 tablets (17.2 mg total) by mouth at bedtime.   topiramate 25 MG capsule Commonly known as:  TOPAMAX Take 75 mg by mouth at bedtime. Take 25mg s in the morning and 75mg s at night   traZODone 50 MG tablet Commonly known as:  DESYREL Take 50 mg by mouth at bedtime as needed for sleep.   Vitamin D3 1000 units Caps Take 2,000 Units by mouth daily.   Zinc 50 MG Tabs Take 50 mg by mouth daily.       Diagnostic Studies: Dg Pelvis Portable  Result Date: 06/24/2016 CLINICAL DATA:  Total hip arthroplasty. EXAM: PORTABLE PELVIS 1-2 VIEWS COMPARISON:  06/24/2016 . FINDINGS: Total right hip arthroplasty. Hardware intact. Good anatomic alignment. IMPRESSION: Total right hip arthroplasty with good anatomic alignment. Electronically Signed   By: Marcello Moores  Register   On: 06/24/2016 17:30   Mm Digital Diagnostic Unilat R  Result Date: 06/03/2016 CLINICAL DATA:  Status post stereotactic guided core biopsy of right breast calcifications. EXAM: DIAGNOSTIC RIGHT MAMMOGRAM POST STEREOTACTIC BIOPSY COMPARISON:  Previous exam(s). FINDINGS: Mammographic images were obtained following stereotactic guided biopsy of calcifications in the lower inner quadrant of the right breast. An X shaped clip is identified in the lower outer quadrant, 4 cm lateral to the calcifications which were biopsied. Calcifications are was numerous compare with pre biopsy examinations. IMPRESSION: Clip migration 4 cm lateral to the remaining calcifications. Final Assessment: Post Procedure Mammograms for Marker Placement  Electronically Signed   By: Nolon Nations M.D.   On: 06/03/2016 13:59   Dg C-arm 61-120 Min  Result Date: 06/24/2016 CLINICAL DATA:  Right hip arthroplasty EXAM: DG C-ARM 61-120 MIN COMPARISON:  None. FINDINGS: C-arm fluoroscopic views of the right hip after noncemented right pressfit arthroplasty. No apparent hardware failure. Fine bony detail limited by C-arm fluoroscopic technique but no gross fractures. IMPRESSION: Right hip arthroplasty without acute findings of hardware failure. Alignment appears near anatomic. Electronically Signed   By: Ashley Royalty M.D.   On: 06/24/2016 15:28   Dg Hip Operative Unilat With Pelvis Right  Result Date: 06/24/2016 CLINICAL DATA:  Right hip arthroplasty EXAM: OPERATIVE RIGHT HIP (WITH PELVIS IF PERFORMED) 2 VIEWS  TECHNIQUE: Fluoroscopic spot image(s) were submitted for interpretation post-operatively. COMPARISON:  None FLUOROSCOPY TIME:  19 seconds FINDINGS: Interval right total hip arthroplasty without failure or complication. No fracture or dislocation. IMPRESSION: Right total hip arthroplasty. Electronically Signed   By: Kathreen Devoid   On: 06/24/2016 15:27   Mm Rt Breast Bx W Loc Dev 1st Lesion Image Bx Spec Stereo Guide  Addendum Date: 06/04/2016   ADDENDUM REPORT: 06/04/2016 13:11 ADDENDUM: Pathology revealed FIBROCYSTIC CHANGES WITH CALCIFICATIONS of the Right breast, lower inner quadrant. This was found to be concordant by Dr. Nolon Nations. Pathology results were discussed with the patient by telephone. The patient reported doing well after the biopsy with tenderness at the site. Post biopsy instructions and care were reviewed and questions were answered. The patient was encouraged to call [facility] for any additional concerns. The patient was instructed to return for annual screening mammography at [facility] in [place], [place]. Pathology results reported by Terie Purser, RN on 06/04/2016. Electronically Signed   By:  Nolon Nations M.D.   On: 06/04/2016 13:11   Result Date: 06/04/2016 CLINICAL DATA:  Patient presents for stereotactic guided core biopsy of right breast calcifications. EXAM: RIGHT BREAST STEREOTACTIC CORE NEEDLE BIOPSY COMPARISON:  Previous exams. FINDINGS: The patient and I discussed the procedure of stereotactic-guided biopsy including benefits and alternatives. We discussed the high likelihood of a successful procedure. We discussed the risks of the procedure including infection, bleeding, tissue injury, clip migration, and inadequate sampling. Informed written consent was given. The usual time out protocol was performed immediately prior to the procedure. Using sterile technique and 1% Lidocaine as local anesthetic, under stereotactic guidance, a 9 gauge vacuum assist device was used to perform core needle biopsy of calcifications the lower inner quadrant of the right breast using a medial approach. Specimen radiograph was performed showing calcifications to be present. Specimens with calcifications are identified for pathology. At the conclusion of the procedure, a X shaped tissue marker clip was deployed into the biopsy cavity. Follow-up 2-view mammogram was performed and dictated separately. IMPRESSION: Stereotactic-guided biopsy of right breast calcifications. No apparent complications. Electronically Signed: By: Nolon Nations M.D. On: 06/03/2016 13:57    Disposition: Final discharge disposition not confirmed  Discharge Instructions    Call MD / Call 911    Complete by:  As directed    If you experience chest pain or shortness of breath, CALL 911 and be transported to the hospital emergency room.  If you develope a fever above 101 F, pus (white drainage) or increased drainage or redness at the wound, or calf pain, call your surgeon's office.   Constipation Prevention    Complete by:  As directed    Drink plenty of fluids.  Prune juice may be helpful.  You may use a stool softener, such as  Colace (over the counter) 100 mg twice a day.  Use MiraLax (over the counter) for constipation as needed.   Diet - low sodium heart healthy    Complete by:  As directed    Driving restrictions    Complete by:  As directed    No driving for 6 weeks   Increase activity slowly as tolerated    Complete by:  As directed    Lifting restrictions    Complete by:  As directed    No lifting for 6 weeks   TED hose    Complete by:  As directed    Use stockings (TED hose)  for 2 weeks on both leg(s).  You may remove them at night for sleeping.      Follow-up Information    Yuval Rubens, Horald Pollen, MD. Schedule an appointment as soon as possible for a visit in 2 weeks.   Specialty:  Orthopedic Surgery Why:  For wound re-check Contact information: [address] [address] [phone]            Signed: Elie Goody 06/25/2016, 7:31 AM

## 2016-06-25 NOTE — Progress Notes (Signed)
   Subjective:  Patient reports pain as mild to moderate.  Denies N/V/CP/SOB.  Objective:   VITALS:   Vitals:   06/24/16 1900 06/24/16 2045 06/25/16 0012 06/25/16 0500  BP: 120/62  122/70 125/74  Pulse: 84  82 79  Resp: 16  16 16   Temp: 97.9 F (36.6 C)  97.1 F (36.2 C) 97.1 F (36.2 C)  TempSrc: Oral  Oral Oral  SpO2: 99% 99% 99%   Weight:      Height:        NAD Neurologically intact ABD soft Sensation intact distally Intact pulses distally Dorsiflexion/Plantar flexion intact Incision: dressing C/D/I Compartment soft   Lab Results  Component Value Date   WBC 7.5 06/13/2016   HGB 13.4 06/13/2016   HCT 42.6 06/13/2016   MCV 88.2 06/13/2016   PLT 229 06/13/2016   BMET    Component Value Date/Time   NA 139 06/13/2016 0947   K 4.1 06/13/2016 0947   CL 110 06/13/2016 0947   CO2 23 06/13/2016 0947   GLUCOSE 107 (H) 06/13/2016 0947   BUN 13 06/13/2016 0947   CREATININE 0.84 06/13/2016 0947   CALCIUM 9.1 06/13/2016 0947   GFRNONAA >60 06/13/2016 0947   GFRAA >60 06/13/2016 0947     Assessment/Plan: 1 Day Post-Op   Principal Problem:   Osteoarthritis of right hip Active Problems:   Primary osteoarthritis of right hip   WBAT with walker DVT ppx: apixaban, SCDs, TEDs PO pain control PT/OT Dispo: am labs pending, d/c home today vs tomorrow   Elie Goody 06/25/2016, 7:28 AM   Rod Can, MD Cell [phone]

## 2016-06-25 NOTE — Anesthesia Postprocedure Evaluation (Signed)
Anesthesia Post Note  Patient: PEPPER GRAMLING  Procedure(s) Performed: Procedure(s) (LRB): RIGHT TOTAL HIP ARTHROPLASTY ANTERIOR APPROACH (Right)  Patient location during evaluation: PACU Anesthesia Type: General Level of consciousness: awake and alert Pain management: pain level controlled Vital Signs Assessment: post-procedure vital signs reviewed and stable Respiratory status: spontaneous breathing, nonlabored ventilation, respiratory function stable and patient connected to nasal cannula oxygen Cardiovascular status: blood pressure returned to baseline and stable Postop Assessment: no signs of nausea or vomiting Anesthetic complications: no    Last Vitals:  Vitals:   06/25/16 0012 06/25/16 0500  BP: 122/70 125/74  Pulse: 82 79  Resp: 16 16  Temp: 36.2 C 36.2 C    Last Pain:  Vitals:   06/25/16 0600  TempSrc:   PainSc: Asleep                 Catalina Gravel

## 2016-06-25 NOTE — Discharge Instructions (Signed)
Information on my medicine - ELIQUIS (apixaban)  This medication education was reviewed with me or my healthcare representative as part of my discharge preparation.  The pharmacist that spoke with me during my hospital stay was:  Eudelia Bunch, [facility]  Why was Eliquis prescribed for you? Eliquis was prescribed for you to reduce the risk of blood clots forming after orthopedic surgery.    What do You need to know about Eliquis? Take your Eliquis TWICE DAILY - one tablet in the morning and one tablet in the evening with or without food.  It would be best to take the dose about the same time each day.  If you have difficulty swallowing the tablet whole please discuss with your pharmacist how to take the medication safely.  Take Eliquis exactly as prescribed by your doctor and DO NOT stop taking Eliquis without talking to the doctor who prescribed the medication.  Stopping without other medication to take the place of Eliquis may increase your risk of developing a clot.  After discharge, you should have regular check-up appointments with your healthcare provider that is prescribing your Eliquis.  What do you do if you miss a dose? If a dose of ELIQUIS is not taken at the scheduled time, take it as soon as possible on the same day and twice-daily administration should be resumed.  The dose should not be doubled to make up for a missed dose.  Do not take more than one tablet of ELIQUIS at the same time.  Important Safety Information A possible side effect of Eliquis is bleeding. You should call your healthcare provider right away if you experience any of the following: ? Bleeding from an injury or your nose that does not stop. ? Unusual colored urine (red or dark brown) or unusual colored stools (red or black). ? Unusual bruising for unknown reasons. ? A serious fall or if you hit your head (even if there is no bleeding).  Some medicines may interact with Eliquis and might increase  your risk of bleeding or clotting while on Eliquis. To help avoid this, consult your healthcare provider or pharmacist prior to using any new prescription or non-prescription medications, including herbals, vitamins, non-steroidal anti-inflammatory drugs (NSAIDs) and supplements.  This website has more information on Eliquis (apixaban): [email or web address].[email or web address] Dr. Rod Can Joint Replacement Specialist [facility] [address], [place],  [address] [phone]   TOTAL HIP REPLACEMENT POSTOPERATIVE DIRECTIONS    Hip Rehabilitation, Guidelines Following Surgery   WEIGHT BEARING Weight bearing as tolerated with assist device (walker, cane, etc) as directed, use it as long as suggested by your surgeon or therapist, typically at least 4-6 weeks.  The results of a hip operation are greatly improved after range of motion and muscle strengthening exercises. Follow all safety measures which are given to protect your hip. If any of these exercises cause increased pain or swelling in your joint, decrease the amount until you are comfortable again. Then slowly increase the exercises. Call your caregiver if you have problems or questions.   HOME CARE INSTRUCTIONS  Most of the following instructions are designed to prevent the dislocation of your new hip.  Remove items at home which could result in a fall. This includes throw rugs or furniture in walking pathways.  Continue medications as instructed at time of discharge.  You may have some home medications which will be placed on hold until you complete the course of blood thinner medication.  You may start showering once  you are discharged home. Do not remove your dressing. Do not put on socks or shoes without following the instructions of your caregivers.   Sit on chairs with arms. Use the chair arms to help push yourself up when arising.  Arrange for the use of a toilet seat elevator so you are  not sitting low.   Walk with walker as instructed.  You may resume a sexual relationship in one month or when given the OK by your caregiver.  Use walker as long as suggested by your caregivers.  You may put full weight on your legs and walk as much as is comfortable. Avoid periods of inactivity such as sitting longer than an hour when not asleep. This helps prevent blood clots.  You may return to work once you are cleared by [employer], production.  Do not drive a car for 6 weeks or until released by your surgeon.  Do not drive while taking narcotics.  Wear elastic stockings for two weeks following surgery during the day but you may remove then at night.  Make sure you keep all of your appointments after your operation with all of your doctors and caregivers. You should call the office at the above phone number and make an appointment for approximately two weeks after the date of your surgery. Please pick up a stool softener and laxative for home use as long as you are requiring pain medications.  ICE to the affected hip every three hours for 30 minutes at a time and then as needed for pain and swelling. Continue to use ice on the hip for pain and swelling from surgery. You may notice swelling that will progress down to the foot and ankle.  This is normal after surgery.  Elevate the leg when you are not up walking on it.   It is important for you to complete the blood thinner medication as prescribed by your doctor.  Continue to use the breathing machine which will help keep your temperature down.  It is common for your temperature to cycle up and down following surgery, especially at night when you are not up moving around and exerting yourself.  The breathing machine keeps your lungs expanded and your temperature down.  RANGE OF MOTION AND STRENGTHENING EXERCISES  These exercises are designed to help you keep full movement of your hip joint. Follow your caregiver's or physical therapist's instructions.  Perform all exercises about fifteen times, three times per day or as directed. Exercise both hips, even if you have had only one joint replacement. These exercises can be done on a training (exercise) mat, on the floor, on a table or on a bed. Use whatever works the best and is most comfortable for you. Use music or television while you are exercising so that the exercises are a pleasant break in your day. This will make your life better with the exercises acting as a break in routine you can look forward to.  Lying on your back, slowly slide your foot toward your buttocks, raising your knee up off the floor. Then slowly slide your foot back down until your leg is straight again.  Lying on your back spread your legs as far apart as you can without causing discomfort.  Lying on your side, raise your upper leg and foot straight up from the floor as far as is comfortable. Slowly lower the leg and repeat.  Lying on your back, tighten up the muscle in the front of your thigh (quadriceps muscles).  You can do this by keeping your leg straight and trying to raise your heel off the floor. This helps strengthen the largest muscle supporting your knee.  Lying on your back, tighten up the muscles of your buttocks both with the legs straight and with the knee bent at a comfortable angle while keeping your heel on the floor.   SKILLED REHAB INSTRUCTIONS: If the patient is transferred to a skilled rehab facility following release from the hospital, a list of the current medications will be sent to the facility for the patient to continue.  When discharged from the skilled rehab facility, please have the facility set up the patient's [place] prior to being released. Also, the skilled facility will be responsible for providing the patient with their medications at time of release from the facility to include their pain medication and their blood thinner medication. If the patient is still at the rehab  facility at time of the two week follow up appointment, the skilled rehab facility will also need to assist the patient in arranging follow up appointment in our office and any transportation needs.  MAKE SURE YOU:  Understand these instructions.  Will watch your condition.  Will get help right away if you are not doing well or get worse.  Pick up stool softner and laxative for home use following surgery while on pain medications. Do not remove your dressing. The dressing is waterproof--it is OK to take showers. Continue to use ice for pain and swelling after surgery. Do not use any lotions or creams on the incision until instructed by your surgeon. Total Hip Protocol.

## 2016-06-25 NOTE — Evaluation (Signed)
Occupational Therapy Evaluation and Discharge Patient Details Name: Evelyn Valdez MRN: [id number] DOB: 1941-02-18 Today's Date: 06/25/2016    History of Present Illness 75 y/o female s/p Right total hip arthroplasty, direct anterior approach. Pt has a past medical history of Anxiety; Asthma; Depression; Diverticulosis; Dizziness; DVT (deep venous thrombosis); Dyspnea; Fibromyalgia; Hypertension; Obesity; Osteoarthritis; Osteopenia; Peripheral neuropathy; and Wears glasses. History of R knee replacement, R shoulder surgery, back surgery.   Clinical Impression   PTA Pt independent in ADL and mod I with mobility. Pt currently mod assist for ADL and min guard for mobility with RW. Pt and husband very comfortable and confident with compensatory strategies for self-care because of previous surgeries. No questions or concerns about transfers or AD in home environment. Pt and husband make good team. No further OT concerns at this time. OT to sign off.     Follow Up Recommendations  No OT follow up;Supervision/Assistance - 24 hour    Equipment Recommendations  None recommended by OT    Recommendations for Other Services       Precautions / Restrictions Precautions Precautions: Fall Restrictions Weight Bearing Restrictions: Yes RLE Weight Bearing: Weight bearing as tolerated      Mobility Bed Mobility Overal bed mobility: Needs Assistance Bed Mobility: Supine to Sit     Supine to sit: Mod assist     General bed mobility comments: Pt sitting OOB in recliner upon OT arrival  Transfers Overall transfer level: Needs assistance Equipment used: Rolling walker (2 wheeled) Transfers: Sit to/from Stand Sit to Stand: Min guard         General transfer comment: verbal cues for safe hand placement, "Oh yeah, the other therapist told me to push up from my seat"    Balance Overall balance assessment: Needs assistance Sitting-balance support: No upper extremity supported;Feet  supported Sitting balance-Leahy Scale: Good     Standing balance support: Single extremity supported;During functional activity Standing balance-Leahy Scale: Poor Standing balance comment: sit to stand for dressing, use of single UE on RW for support, husband also guarding closely                            ADL Overall ADL's : Needs assistance/impaired Eating/Feeding: Modified independent;Sitting     Grooming Details (indicate cue type and reason): Discussed that Pt will be using wheelchair to sit and perform ADL in bathroom at home             Lower Body Dressing: Minimal assistance;With caregiver independent assisting;Cueing for sequencing;Sit to/from stand Lower Body Dressing Details (indicate cue type and reason): Pt and husband dressed sit to stand. underwear, shorts, ted hose and shoes Toilet Transfer: Min guard;BSC;RW (vc for safety with RW; simulated with recliner)   Toileting- Clothing Manipulation and Hygiene: Minimal assistance Toileting - Clothing Manipulation Details (indicate cue type and reason): min guard while standing for balance   Tub/Shower Transfer Details (indicate cue type and reason): Pt provided handout on tub/shower entry. Pt's husband recently installed grab bars, and Pt and husband very comfortable with zero concerns about transfer Functional mobility during ADLs:  (sit to stand performed from recliner) General ADL Comments: Pt and husband very comfortable from previous surgeries with bathing and dressing compensations and sequences. Pt provided with handout on safe shower entry. Pt and husband able to dress lower body with no assist from therapist.     Vision     Perception     Praxis  Pertinent Vitals/Pain Pain Assessment: 0-10 Pain Score: 7  Pain Location: R hip with movement Pain Descriptors / Indicators: Grimacing;Guarding;Sore Pain Intervention(s): Monitored during session;Repositioned     Hand Dominance Right    Extremity/Trunk Assessment Upper Extremity Assessment Upper Extremity Assessment: Overall WFL for tasks assessed   Lower Extremity Assessment Lower Extremity Assessment: Defer to PT evaluation RLE Deficits / Details: Strength and ROM limited post op.  Sensation intact.   RLE Sensation: history of peripheral neuropathy RLE Coordination: decreased fine motor;decreased gross motor   Cervical / Trunk Assessment Cervical / Trunk Assessment: Normal   Communication Communication Communication: No difficulties   Cognition Arousal/Alertness: Awake/alert Behavior During Therapy: WFL for tasks assessed/performed Overall Cognitive Status: Within Functional Limits for tasks assessed                     General Comments       Exercises       Shoulder Instructions      Home Living Family/patient expects to be discharged to:: Private residence Living Arrangements: Spouse/significant other Available Help at Discharge: Family;Available 24 hours/day Type of Home: House Home Access: Ramped entrance     Home Layout: One level     Bathroom Shower/Tub: Tub/shower unit Shower/tub characteristics: Curtain Biochemist, clinical: Handicapped height Bathroom Accessibility: Yes How Accessible: Accessible via wheelchair Home Equipment: Liberty - 2 wheels;Cane - single point;Bedside commode;Grab bars - toilet;Grab bars - tub/shower;Wheelchair - manual   Additional Comments: Pt with previous surgeries total R knee, R shoulder.       Prior Functioning/Environment Level of Independence: Independent with assistive device(s)        Comments: Pt independent in ADL        OT Problem List: Decreased strength;Decreased range of motion;Decreased activity tolerance;Impaired balance (sitting and/or standing);Decreased knowledge of use of DME or AE;Pain   OT Treatment/Interventions:      OT Goals(Current goals can be found in the care plan section) Acute Rehab OT Goals Patient Stated Goal:  Home this pm OT Goal Formulation: With patient/family Time For Goal Achievement: 07/02/16 Potential to Achieve Goals: Good  OT Frequency:     Barriers to D/C:            Co-evaluation              End of Session Equipment Utilized During Treatment: Rolling walker Nurse Communication: Mobility status  Activity Tolerance: Patient tolerated treatment well Patient left: in chair;with call bell/phone within reach;with family/visitor present   Time: 1029-1058 OT Time Calculation (min): 29 min Charges:  OT General Charges $OT Visit: 1 Procedure OT Evaluation $OT Eval Low Complexity: 1 Procedure OT Treatments $Self Care/Home Management : 8-22 mins G-Codes:    Merri Ray Miyoshi Ligas 06-26-16, 11:52 AM  Hulda Humphrey OTR/L [phone]

## 2016-06-25 NOTE — Progress Notes (Signed)
Physical Therapy Treatment Patient Details Name: Evelyn Valdez MRN: [id number] DOB: 1940-10-22 Today's Date: 06/25/2016    History of Present Illness 75 y/o female s/p Right total hip arthroplasty, direct anterior approach. Pt has a past medical history of Anxiety; Asthma; Depression; Diverticulosis; Dizziness; DVT (deep venous thrombosis); Dyspnea; Fibromyalgia; Hypertension; Obesity; Osteoarthritis; Osteopenia; Peripheral neuropathy; and Wears glasses. History of R knee replacement, R shoulder surgery, back surgery.    PT Comments    Pt moving well and eager for D/C to home today.  Pt voiced no concerns or questions for PT.  Pt ready for D/C from PT perspective.    Follow Up Recommendations  Home health PT;Supervision/Assistance - 24 hour     Equipment Recommendations  None recommended by PT    Recommendations for Other Services       Precautions / Restrictions Precautions Precautions: Fall Restrictions Weight Bearing Restrictions: Yes RLE Weight Bearing: Weight bearing as tolerated    Mobility  Bed Mobility               General bed mobility comments: pt sitting in recliner.  Transfers Overall transfer level: Needs assistance Equipment used: Rolling walker (2 wheeled) Transfers: Sit to/from Stand Sit to Stand: Min guard         General transfer comment: pt with good follow through on cues from this am.    Ambulation/Gait Ambulation/Gait assistance: Min guard Ambulation Distance (Feet): 150 Feet Assistive device: Rolling walker (2 wheeled) Gait Pattern/deviations: Step-through pattern;Decreased stride length     General Gait Details: pt initially moving slowly with very short strides, but then is able to increase speed and perform more fluid gait pattern.     Stairs            Wheelchair Mobility    Modified Rankin (Stroke Patients Only)       Balance Overall balance assessment: Needs assistance Sitting-balance support: No upper  extremity supported;Feet supported Sitting balance-Leahy Scale: Good     Standing balance support: Single extremity supported;Bilateral upper extremity supported;During functional activity Standing balance-Leahy Scale: Poor Standing balance comment: sit to stand for dressing, use of single UE on RW for support, husband also guarding closely                    Cognition Arousal/Alertness: Awake/alert Behavior During Therapy: WFL for tasks assessed/performed Overall Cognitive Status: Within Functional Limits for tasks assessed                      Exercises      General Comments General comments (skin integrity, edema, etc.): Pt's husband present and active participant in the entire session      Pertinent Vitals/Pain Pain Assessment: 0-10 Pain Score: 5  Pain Location: R hip Pain Descriptors / Indicators: Grimacing;Guarding;Sore Pain Intervention(s): Monitored during session;Premedicated before session;Repositioned    Home Living Family/patient expects to be discharged to:: Private residence Living Arrangements: Spouse/significant other Available Help at Discharge: Family;Available 24 hours/day Type of Home: House Home Access: Ramped entrance   Home Layout: One level Home Equipment: Walker - 2 wheels;Cane - single point;Bedside commode;Grab bars - toilet;Grab bars - tub/shower;Wheelchair - manual Additional Comments: Pt with previous surgeries total R knee, R shoulder.     Prior Function Level of Independence: Independent with assistive device(s)      Comments: Pt independent in ADL   PT Goals (current goals can now be found in the care plan section) Acute Rehab PT Goals Patient Stated Goal:  Home this pm PT Goal Formulation: With patient Time For Goal Achievement: 07/02/16 Potential to Achieve Goals: Good Progress towards PT goals: Progressing toward goals    Frequency    7X/week      PT Plan Current plan remains appropriate    Co-evaluation              End of Session Equipment Utilized During Treatment: Gait belt Activity Tolerance: Patient tolerated treatment well Patient left: in chair;with call bell/phone within reach     Time: [id number] PT Time Calculation (min) (ACUTE ONLY): 28 min  Charges:                       G CodesCatarina Hartshorn, [place] 06/25/2016, 3:07 PM

## 2016-06-25 NOTE — Evaluation (Signed)
Physical Therapy Evaluation Patient Details Name: Evelyn Valdez MRN: [id number] DOB: [birth year] Today's Date: 06/25/2016   History of Present Illness  75 y/o female s/p Right total hip arthroplasty, direct anterior approach. Pt has a past medical history of Anxiety; Asthma; Depression; Diverticulosis; Dizziness; DVT (deep venous thrombosis); Dyspnea; Fibromyalgia; Hypertension; Obesity; Osteoarthritis; Osteopenia; Peripheral neuropathy; and Wears glasses. History of R knee replacement, R shoulder surgery, back surgery.  Clinical Impression  Pt moves slowly and c/o SOB with ambulation on RA with sats decreasing to mid 80's, but quickly recovers to 90's with seated rest.  Pt's husband will be providing 24hr care for pt at home and home is W/C accessible.  Pt and husband seem eager to D/C later today.  Will f/u early pm, but anticipate pt will be ready for D/C today.      Follow Up Recommendations Home health PT;Supervision/Assistance - 24 hour    Equipment Recommendations  None recommended by PT    Recommendations for Other Services       Precautions / Restrictions Precautions Precautions: Fall Restrictions Weight Bearing Restrictions: Yes RLE Weight Bearing: Weight bearing as tolerated      Mobility  Bed Mobility Overal bed mobility: Needs Assistance Bed Mobility: Supine to Sit     Supine to sit: Mod assist     General bed mobility comments: A with R LE and bringing trunk up to full sitting.  pt indicates dizziness upon initial sitting, which pt indicates is normal for her.  Dizziness passes within seconds per pt report.    Transfers Overall transfer level: Needs assistance Equipment used: Rolling walker (2 wheeled) Transfers: Sit to/from Stand Sit to Stand: Min guard         General transfer comment: cues for UE use as pt tends to pull from RW.  pt inidicates dizziness upon initial standing which onyl lasts seconds per pt report.     Ambulation/Gait Ambulation/Gait assistance: Min guard Ambulation Distance (Feet): 30 Feet Assistive device: Rolling walker (2 wheeled) Gait Pattern/deviations: Step-to pattern;Decreased step length - left;Decreased stance time - right;Trunk flexed     General Gait Details: pt tends to lean on RW despite cues for more upright posture.  pt indicates fatigue after ambulation and SOB.  pt's O2 monitor slowto provide good wave form for a reading, but seeming to have desated to mid 80's on RA.    Stairs            Wheelchair Mobility    Modified Rankin (Stroke Patients Only)       Balance Overall balance assessment: Needs assistance Sitting-balance support: No upper extremity supported;Feet supported Sitting balance-Leahy Scale: Good     Standing balance support: Bilateral upper extremity supported;Single extremity supported;During functional activity Standing balance-Leahy Scale: Poor                               Pertinent Vitals/Pain Pain Assessment: 0-10 Pain Score: 8  Pain Location: R hip with mobility Pain Descriptors / Indicators: Grimacing;Guarding;Sore Pain Intervention(s): Monitored during session;Premedicated before session;Repositioned    Home Living Family/patient expects to be discharged to:: Private residence Living Arrangements: Spouse/significant other Available Help at Discharge: Family;Available 24 hours/day Type of Home: House Home Access: Ramped entrance     Home Layout: One level Home Equipment: Walker - 2 wheels;Cane - single point;Bedside commode;Grab bars - toilet;Grab bars - tub/shower;Wheelchair - manual      Prior Function Level of Independence: Independent with  assistive device(s)               Hand Dominance        Extremity/Trunk Assessment   Upper Extremity Assessment: Defer to OT evaluation           Lower Extremity Assessment: Generalized weakness;RLE deficits/detail RLE Deficits / Details:  Strength and ROM limited post op.  Sensation intact.      Cervical / Trunk Assessment: Normal  Communication   Communication: No difficulties  Cognition Arousal/Alertness: Awake/alert Behavior During Therapy: WFL for tasks assessed/performed Overall Cognitive Status: Within Functional Limits for tasks assessed                      General Comments      Exercises     Assessment/Plan    PT Assessment Patient needs continued PT services  PT Problem List Decreased strength;Decreased activity tolerance;Decreased balance;Decreased mobility;Decreased knowledge of use of DME;Cardiopulmonary status limiting activity;Obesity;Pain          PT Treatment Interventions DME instruction;Gait training;Functional mobility training;Therapeutic activities;Therapeutic exercise;Balance training;Patient/family education    PT Goals (Current goals can be found in the Care Plan section)  Acute Rehab PT Goals Patient Stated Goal: Home this pm PT Goal Formulation: With patient Time For Goal Achievement: 07/02/16 Potential to Achieve Goals: Good    Frequency 7X/week   Barriers to discharge        Co-evaluation               End of Session Equipment Utilized During Treatment: Gait belt Activity Tolerance: Patient tolerated treatment well Patient left: in chair;with call bell/phone within reach;with family/visitor present Nurse Communication: Mobility status         Time: [id number] PT Time Calculation (min) (ACUTE ONLY): 28 min   Charges:   PT Evaluation $PT Eval Moderate Complexity: 1 Procedure PT Treatments $Gait Training: 8-22 mins   PT G CodesCatarina Valdez, [place] 06/25/2016, 9:39 AM

## 2016-06-26 DIAGNOSIS — Z96651 Presence of right artificial knee joint: Secondary | ICD-10-CM | POA: Diagnosis not present

## 2016-06-26 DIAGNOSIS — Z79891 Long term (current) use of opiate analgesic: Secondary | ICD-10-CM | POA: Diagnosis not present

## 2016-06-26 DIAGNOSIS — Z471 Aftercare following joint replacement surgery: Secondary | ICD-10-CM | POA: Diagnosis not present

## 2016-06-26 DIAGNOSIS — Z7901 Long term (current) use of anticoagulants: Secondary | ICD-10-CM | POA: Diagnosis not present

## 2016-06-26 DIAGNOSIS — Z96641 Presence of right artificial hip joint: Secondary | ICD-10-CM | POA: Diagnosis not present

## 2016-06-26 LAB — TYPE AND SCREEN
ABO/RH(D): O NEG
Antibody Screen: POSITIVE
DAT, IgG: NEGATIVE
DONOR AG TYPE: NEGATIVE
Donor AG Type: NEGATIVE
PT AG TYPE: NEGATIVE
UNIT DIVISION: 0
UNIT DIVISION: 0

## 2016-07-01 DIAGNOSIS — Z79891 Long term (current) use of opiate analgesic: Secondary | ICD-10-CM | POA: Diagnosis not present

## 2016-07-01 DIAGNOSIS — Z471 Aftercare following joint replacement surgery: Secondary | ICD-10-CM | POA: Diagnosis not present

## 2016-07-01 DIAGNOSIS — Z96641 Presence of right artificial hip joint: Secondary | ICD-10-CM | POA: Diagnosis not present

## 2016-07-01 DIAGNOSIS — Z7901 Long term (current) use of anticoagulants: Secondary | ICD-10-CM | POA: Diagnosis not present

## 2016-07-01 DIAGNOSIS — Z96651 Presence of right artificial knee joint: Secondary | ICD-10-CM | POA: Diagnosis not present

## 2016-07-05 DIAGNOSIS — E538 Deficiency of other specified B group vitamins: Secondary | ICD-10-CM | POA: Diagnosis not present

## 2016-07-09 DIAGNOSIS — Z96641 Presence of right artificial hip joint: Secondary | ICD-10-CM | POA: Diagnosis not present

## 2016-07-09 DIAGNOSIS — Z471 Aftercare following joint replacement surgery: Secondary | ICD-10-CM | POA: Diagnosis not present

## 2016-07-12 DIAGNOSIS — Z471 Aftercare following joint replacement surgery: Secondary | ICD-10-CM | POA: Diagnosis not present

## 2016-07-12 DIAGNOSIS — M25551 Pain in right hip: Secondary | ICD-10-CM | POA: Diagnosis not present

## 2016-07-12 DIAGNOSIS — R2689 Other abnormalities of gait and mobility: Secondary | ICD-10-CM | POA: Diagnosis not present

## 2016-07-12 DIAGNOSIS — Z96641 Presence of right artificial hip joint: Secondary | ICD-10-CM | POA: Diagnosis not present

## 2016-08-01 DIAGNOSIS — Z96641 Presence of right artificial hip joint: Secondary | ICD-10-CM | POA: Diagnosis not present

## 2016-08-01 DIAGNOSIS — M19012 Primary osteoarthritis, left shoulder: Secondary | ICD-10-CM | POA: Diagnosis not present

## 2016-08-05 DIAGNOSIS — E538 Deficiency of other specified B group vitamins: Secondary | ICD-10-CM | POA: Diagnosis not present

## 2016-08-12 DIAGNOSIS — Z471 Aftercare following joint replacement surgery: Secondary | ICD-10-CM | POA: Diagnosis not present

## 2016-08-12 DIAGNOSIS — G8929 Other chronic pain: Secondary | ICD-10-CM | POA: Diagnosis not present

## 2016-08-12 DIAGNOSIS — M25551 Pain in right hip: Secondary | ICD-10-CM | POA: Diagnosis not present

## 2016-08-12 DIAGNOSIS — Z96641 Presence of right artificial hip joint: Secondary | ICD-10-CM | POA: Diagnosis not present

## 2016-08-12 DIAGNOSIS — M25512 Pain in left shoulder: Secondary | ICD-10-CM | POA: Diagnosis not present

## 2016-08-12 DIAGNOSIS — M25651 Stiffness of right hip, not elsewhere classified: Secondary | ICD-10-CM | POA: Diagnosis not present

## 2016-08-12 DIAGNOSIS — R2689 Other abnormalities of gait and mobility: Secondary | ICD-10-CM | POA: Diagnosis not present

## 2016-09-05 DIAGNOSIS — E538 Deficiency of other specified B group vitamins: Secondary | ICD-10-CM | POA: Diagnosis not present

## 2016-09-05 DIAGNOSIS — M25651 Stiffness of right hip, not elsewhere classified: Secondary | ICD-10-CM | POA: Diagnosis not present

## 2016-09-05 DIAGNOSIS — G8929 Other chronic pain: Secondary | ICD-10-CM | POA: Diagnosis not present

## 2016-09-05 DIAGNOSIS — R2689 Other abnormalities of gait and mobility: Secondary | ICD-10-CM | POA: Diagnosis not present

## 2016-09-05 DIAGNOSIS — Z471 Aftercare following joint replacement surgery: Secondary | ICD-10-CM | POA: Diagnosis not present

## 2016-09-05 DIAGNOSIS — M25551 Pain in right hip: Secondary | ICD-10-CM | POA: Diagnosis not present

## 2016-09-05 DIAGNOSIS — M25512 Pain in left shoulder: Secondary | ICD-10-CM | POA: Diagnosis not present

## 2016-09-05 DIAGNOSIS — Z96641 Presence of right artificial hip joint: Secondary | ICD-10-CM | POA: Diagnosis not present

## 2016-09-10 DIAGNOSIS — Z7409 Other reduced mobility: Secondary | ICD-10-CM | POA: Diagnosis not present

## 2016-09-10 DIAGNOSIS — M25651 Stiffness of right hip, not elsewhere classified: Secondary | ICD-10-CM | POA: Diagnosis not present

## 2016-09-10 DIAGNOSIS — M25551 Pain in right hip: Secondary | ICD-10-CM | POA: Diagnosis not present

## 2016-09-10 DIAGNOSIS — R2681 Unsteadiness on feet: Secondary | ICD-10-CM | POA: Diagnosis not present

## 2016-09-10 DIAGNOSIS — M25512 Pain in left shoulder: Secondary | ICD-10-CM | POA: Diagnosis not present

## 2016-09-12 DIAGNOSIS — M7542 Impingement syndrome of left shoulder: Secondary | ICD-10-CM | POA: Diagnosis not present

## 2016-09-24 DIAGNOSIS — M7542 Impingement syndrome of left shoulder: Secondary | ICD-10-CM | POA: Diagnosis not present

## 2016-09-26 DIAGNOSIS — M7542 Impingement syndrome of left shoulder: Secondary | ICD-10-CM | POA: Diagnosis not present

## 2016-09-26 DIAGNOSIS — M19012 Primary osteoarthritis, left shoulder: Secondary | ICD-10-CM | POA: Diagnosis not present

## 2016-09-26 DIAGNOSIS — M25512 Pain in left shoulder: Secondary | ICD-10-CM | POA: Diagnosis not present

## 2016-09-27 DIAGNOSIS — J329 Chronic sinusitis, unspecified: Secondary | ICD-10-CM | POA: Diagnosis not present

## 2016-09-27 DIAGNOSIS — L709 Acne, unspecified: Secondary | ICD-10-CM | POA: Diagnosis not present

## 2016-10-01 DIAGNOSIS — R0602 Shortness of breath: Secondary | ICD-10-CM | POA: Diagnosis not present

## 2016-10-01 DIAGNOSIS — J22 Unspecified acute lower respiratory infection: Secondary | ICD-10-CM | POA: Diagnosis not present

## 2016-10-01 DIAGNOSIS — R05 Cough: Secondary | ICD-10-CM | POA: Diagnosis not present

## 2016-10-01 DIAGNOSIS — J09X2 Influenza due to identified novel influenza A virus with other respiratory manifestations: Secondary | ICD-10-CM | POA: Diagnosis not present

## 2016-10-08 DIAGNOSIS — J22 Unspecified acute lower respiratory infection: Secondary | ICD-10-CM | POA: Diagnosis not present

## 2016-10-15 DIAGNOSIS — E538 Deficiency of other specified B group vitamins: Secondary | ICD-10-CM | POA: Diagnosis not present

## 2016-10-15 DIAGNOSIS — J22 Unspecified acute lower respiratory infection: Secondary | ICD-10-CM | POA: Diagnosis not present

## 2016-10-15 DIAGNOSIS — R05 Cough: Secondary | ICD-10-CM | POA: Diagnosis not present

## 2016-10-17 DIAGNOSIS — M1611 Unilateral primary osteoarthritis, right hip: Secondary | ICD-10-CM | POA: Diagnosis not present

## 2016-10-17 DIAGNOSIS — M5416 Radiculopathy, lumbar region: Secondary | ICD-10-CM | POA: Diagnosis not present

## 2016-10-17 DIAGNOSIS — E538 Deficiency of other specified B group vitamins: Secondary | ICD-10-CM | POA: Diagnosis not present

## 2016-10-17 DIAGNOSIS — M797 Fibromyalgia: Secondary | ICD-10-CM | POA: Diagnosis not present

## 2016-10-17 DIAGNOSIS — G609 Hereditary and idiopathic neuropathy, unspecified: Secondary | ICD-10-CM | POA: Diagnosis not present

## 2016-10-23 DIAGNOSIS — M19012 Primary osteoarthritis, left shoulder: Secondary | ICD-10-CM | POA: Diagnosis not present

## 2016-10-31 DIAGNOSIS — M12812 Other specific arthropathies, not elsewhere classified, left shoulder: Secondary | ICD-10-CM | POA: Diagnosis not present

## 2016-10-31 DIAGNOSIS — M75102 Unspecified rotator cuff tear or rupture of left shoulder, not specified as traumatic: Secondary | ICD-10-CM | POA: Diagnosis not present

## 2016-10-31 DIAGNOSIS — M19012 Primary osteoarthritis, left shoulder: Secondary | ICD-10-CM | POA: Diagnosis not present

## 2016-10-31 DIAGNOSIS — M7542 Impingement syndrome of left shoulder: Secondary | ICD-10-CM | POA: Diagnosis not present

## 2016-10-31 DIAGNOSIS — M1712 Unilateral primary osteoarthritis, left knee: Secondary | ICD-10-CM | POA: Diagnosis not present

## 2016-11-20 DIAGNOSIS — M7522 Bicipital tendinitis, left shoulder: Secondary | ICD-10-CM | POA: Diagnosis not present

## 2016-11-20 DIAGNOSIS — M94212 Chondromalacia, left shoulder: Secondary | ICD-10-CM | POA: Diagnosis not present

## 2016-11-20 DIAGNOSIS — M7542 Impingement syndrome of left shoulder: Secondary | ICD-10-CM | POA: Diagnosis not present

## 2016-11-20 DIAGNOSIS — M24112 Other articular cartilage disorders, left shoulder: Secondary | ICD-10-CM | POA: Diagnosis not present

## 2016-11-20 DIAGNOSIS — M19012 Primary osteoarthritis, left shoulder: Secondary | ICD-10-CM | POA: Diagnosis not present

## 2016-11-20 DIAGNOSIS — G8918 Other acute postprocedural pain: Secondary | ICD-10-CM | POA: Diagnosis not present

## 2016-11-20 DIAGNOSIS — M24012 Loose body in left shoulder: Secondary | ICD-10-CM | POA: Diagnosis not present

## 2016-11-28 DIAGNOSIS — Z0000 Encounter for general adult medical examination without abnormal findings: Secondary | ICD-10-CM | POA: Diagnosis not present

## 2016-12-10 DIAGNOSIS — M25651 Stiffness of right hip, not elsewhere classified: Secondary | ICD-10-CM | POA: Diagnosis not present

## 2016-12-10 DIAGNOSIS — Z7409 Other reduced mobility: Secondary | ICD-10-CM | POA: Diagnosis not present

## 2016-12-10 DIAGNOSIS — M25512 Pain in left shoulder: Secondary | ICD-10-CM | POA: Diagnosis not present

## 2016-12-10 DIAGNOSIS — G8929 Other chronic pain: Secondary | ICD-10-CM | POA: Diagnosis not present

## 2016-12-10 DIAGNOSIS — M25551 Pain in right hip: Secondary | ICD-10-CM | POA: Diagnosis not present

## 2016-12-10 DIAGNOSIS — R2681 Unsteadiness on feet: Secondary | ICD-10-CM | POA: Diagnosis not present

## 2016-12-25 DIAGNOSIS — Z79899 Other long term (current) drug therapy: Secondary | ICD-10-CM | POA: Diagnosis not present

## 2016-12-25 DIAGNOSIS — R5383 Other fatigue: Secondary | ICD-10-CM | POA: Diagnosis not present

## 2016-12-25 DIAGNOSIS — G4719 Other hypersomnia: Secondary | ICD-10-CM | POA: Diagnosis not present

## 2016-12-25 DIAGNOSIS — K580 Irritable bowel syndrome with diarrhea: Secondary | ICD-10-CM | POA: Diagnosis not present

## 2016-12-25 DIAGNOSIS — D509 Iron deficiency anemia, unspecified: Secondary | ICD-10-CM | POA: Diagnosis not present

## 2016-12-25 DIAGNOSIS — G4700 Insomnia, unspecified: Secondary | ICD-10-CM | POA: Diagnosis not present

## 2016-12-31 DIAGNOSIS — G8929 Other chronic pain: Secondary | ICD-10-CM | POA: Diagnosis not present

## 2016-12-31 DIAGNOSIS — M25562 Pain in left knee: Secondary | ICD-10-CM | POA: Diagnosis not present

## 2017-01-08 DIAGNOSIS — G4700 Insomnia, unspecified: Secondary | ICD-10-CM | POA: Diagnosis not present

## 2017-01-08 DIAGNOSIS — K580 Irritable bowel syndrome with diarrhea: Secondary | ICD-10-CM | POA: Diagnosis not present

## 2017-01-16 DIAGNOSIS — G4700 Insomnia, unspecified: Secondary | ICD-10-CM | POA: Diagnosis not present

## 2017-01-16 DIAGNOSIS — M797 Fibromyalgia: Secondary | ICD-10-CM | POA: Diagnosis not present

## 2017-01-16 DIAGNOSIS — M48061 Spinal stenosis, lumbar region without neurogenic claudication: Secondary | ICD-10-CM | POA: Diagnosis not present

## 2017-01-16 DIAGNOSIS — G609 Hereditary and idiopathic neuropathy, unspecified: Secondary | ICD-10-CM | POA: Diagnosis not present

## 2017-01-22 DIAGNOSIS — M1712 Unilateral primary osteoarthritis, left knee: Secondary | ICD-10-CM | POA: Diagnosis not present

## 2017-01-29 DIAGNOSIS — M1712 Unilateral primary osteoarthritis, left knee: Secondary | ICD-10-CM | POA: Diagnosis not present

## 2017-02-06 DIAGNOSIS — J069 Acute upper respiratory infection, unspecified: Secondary | ICD-10-CM | POA: Diagnosis not present

## 2017-02-06 DIAGNOSIS — R600 Localized edema: Secondary | ICD-10-CM | POA: Diagnosis not present

## 2017-02-06 DIAGNOSIS — I10 Essential (primary) hypertension: Secondary | ICD-10-CM | POA: Diagnosis not present

## 2017-02-06 DIAGNOSIS — K580 Irritable bowel syndrome with diarrhea: Secondary | ICD-10-CM | POA: Diagnosis not present

## 2017-02-06 DIAGNOSIS — E669 Obesity, unspecified: Secondary | ICD-10-CM | POA: Diagnosis not present

## 2017-02-11 DIAGNOSIS — M1712 Unilateral primary osteoarthritis, left knee: Secondary | ICD-10-CM | POA: Diagnosis not present

## 2017-03-04 DIAGNOSIS — Z1231 Encounter for screening mammogram for malignant neoplasm of breast: Secondary | ICD-10-CM | POA: Diagnosis not present

## 2017-03-06 DIAGNOSIS — R600 Localized edema: Secondary | ICD-10-CM | POA: Diagnosis not present

## 2017-03-06 DIAGNOSIS — L821 Other seborrheic keratosis: Secondary | ICD-10-CM | POA: Diagnosis not present

## 2017-03-06 DIAGNOSIS — L700 Acne vulgaris: Secondary | ICD-10-CM | POA: Diagnosis not present

## 2017-03-06 DIAGNOSIS — T464X5A Adverse effect of angiotensin-converting-enzyme inhibitors, initial encounter: Secondary | ICD-10-CM | POA: Diagnosis not present

## 2017-03-06 DIAGNOSIS — G609 Hereditary and idiopathic neuropathy, unspecified: Secondary | ICD-10-CM | POA: Diagnosis not present

## 2017-03-06 DIAGNOSIS — L814 Other melanin hyperpigmentation: Secondary | ICD-10-CM | POA: Diagnosis not present

## 2017-03-06 DIAGNOSIS — F330 Major depressive disorder, recurrent, mild: Secondary | ICD-10-CM | POA: Diagnosis not present

## 2017-03-06 DIAGNOSIS — R05 Cough: Secondary | ICD-10-CM | POA: Diagnosis not present

## 2017-03-25 DIAGNOSIS — J029 Acute pharyngitis, unspecified: Secondary | ICD-10-CM | POA: Diagnosis not present

## 2017-03-25 DIAGNOSIS — J309 Allergic rhinitis, unspecified: Secondary | ICD-10-CM | POA: Diagnosis not present

## 2017-03-25 DIAGNOSIS — J069 Acute upper respiratory infection, unspecified: Secondary | ICD-10-CM | POA: Diagnosis not present

## 2017-05-07 DIAGNOSIS — K580 Irritable bowel syndrome with diarrhea: Secondary | ICD-10-CM | POA: Diagnosis not present

## 2017-05-07 DIAGNOSIS — R300 Dysuria: Secondary | ICD-10-CM | POA: Diagnosis not present

## 2017-05-07 DIAGNOSIS — M159 Polyosteoarthritis, unspecified: Secondary | ICD-10-CM | POA: Diagnosis not present

## 2017-05-07 DIAGNOSIS — N390 Urinary tract infection, site not specified: Secondary | ICD-10-CM | POA: Diagnosis not present

## 2017-05-23 DIAGNOSIS — M75102 Unspecified rotator cuff tear or rupture of left shoulder, not specified as traumatic: Secondary | ICD-10-CM | POA: Diagnosis not present

## 2017-05-23 DIAGNOSIS — M25611 Stiffness of right shoulder, not elsewhere classified: Secondary | ICD-10-CM | POA: Diagnosis not present

## 2017-05-23 DIAGNOSIS — M7542 Impingement syndrome of left shoulder: Secondary | ICD-10-CM | POA: Diagnosis not present

## 2017-05-27 DIAGNOSIS — M7061 Trochanteric bursitis, right hip: Secondary | ICD-10-CM | POA: Diagnosis not present

## 2017-05-27 DIAGNOSIS — Z471 Aftercare following joint replacement surgery: Secondary | ICD-10-CM | POA: Diagnosis not present

## 2017-05-27 DIAGNOSIS — M1712 Unilateral primary osteoarthritis, left knee: Secondary | ICD-10-CM | POA: Diagnosis not present

## 2017-05-27 DIAGNOSIS — Z96641 Presence of right artificial hip joint: Secondary | ICD-10-CM | POA: Diagnosis not present

## 2017-06-04 DIAGNOSIS — G8929 Other chronic pain: Secondary | ICD-10-CM | POA: Diagnosis not present

## 2017-06-04 DIAGNOSIS — M25511 Pain in right shoulder: Secondary | ICD-10-CM | POA: Diagnosis not present

## 2017-06-20 DIAGNOSIS — M75102 Unspecified rotator cuff tear or rupture of left shoulder, not specified as traumatic: Secondary | ICD-10-CM | POA: Diagnosis not present

## 2017-06-20 DIAGNOSIS — M25511 Pain in right shoulder: Secondary | ICD-10-CM | POA: Diagnosis not present

## 2017-07-02 DIAGNOSIS — M25511 Pain in right shoulder: Secondary | ICD-10-CM | POA: Diagnosis not present

## 2017-07-09 IMAGING — CR DG PORTABLE PELVIS
1 series · 1 of 1 positions shown · non-contrast
Comparison: 06/24/2016 .

CLINICAL DATA: Total hip arthroplasty.

EXAM:
PORTABLE PELVIS 1-2 VIEWS

[AP]
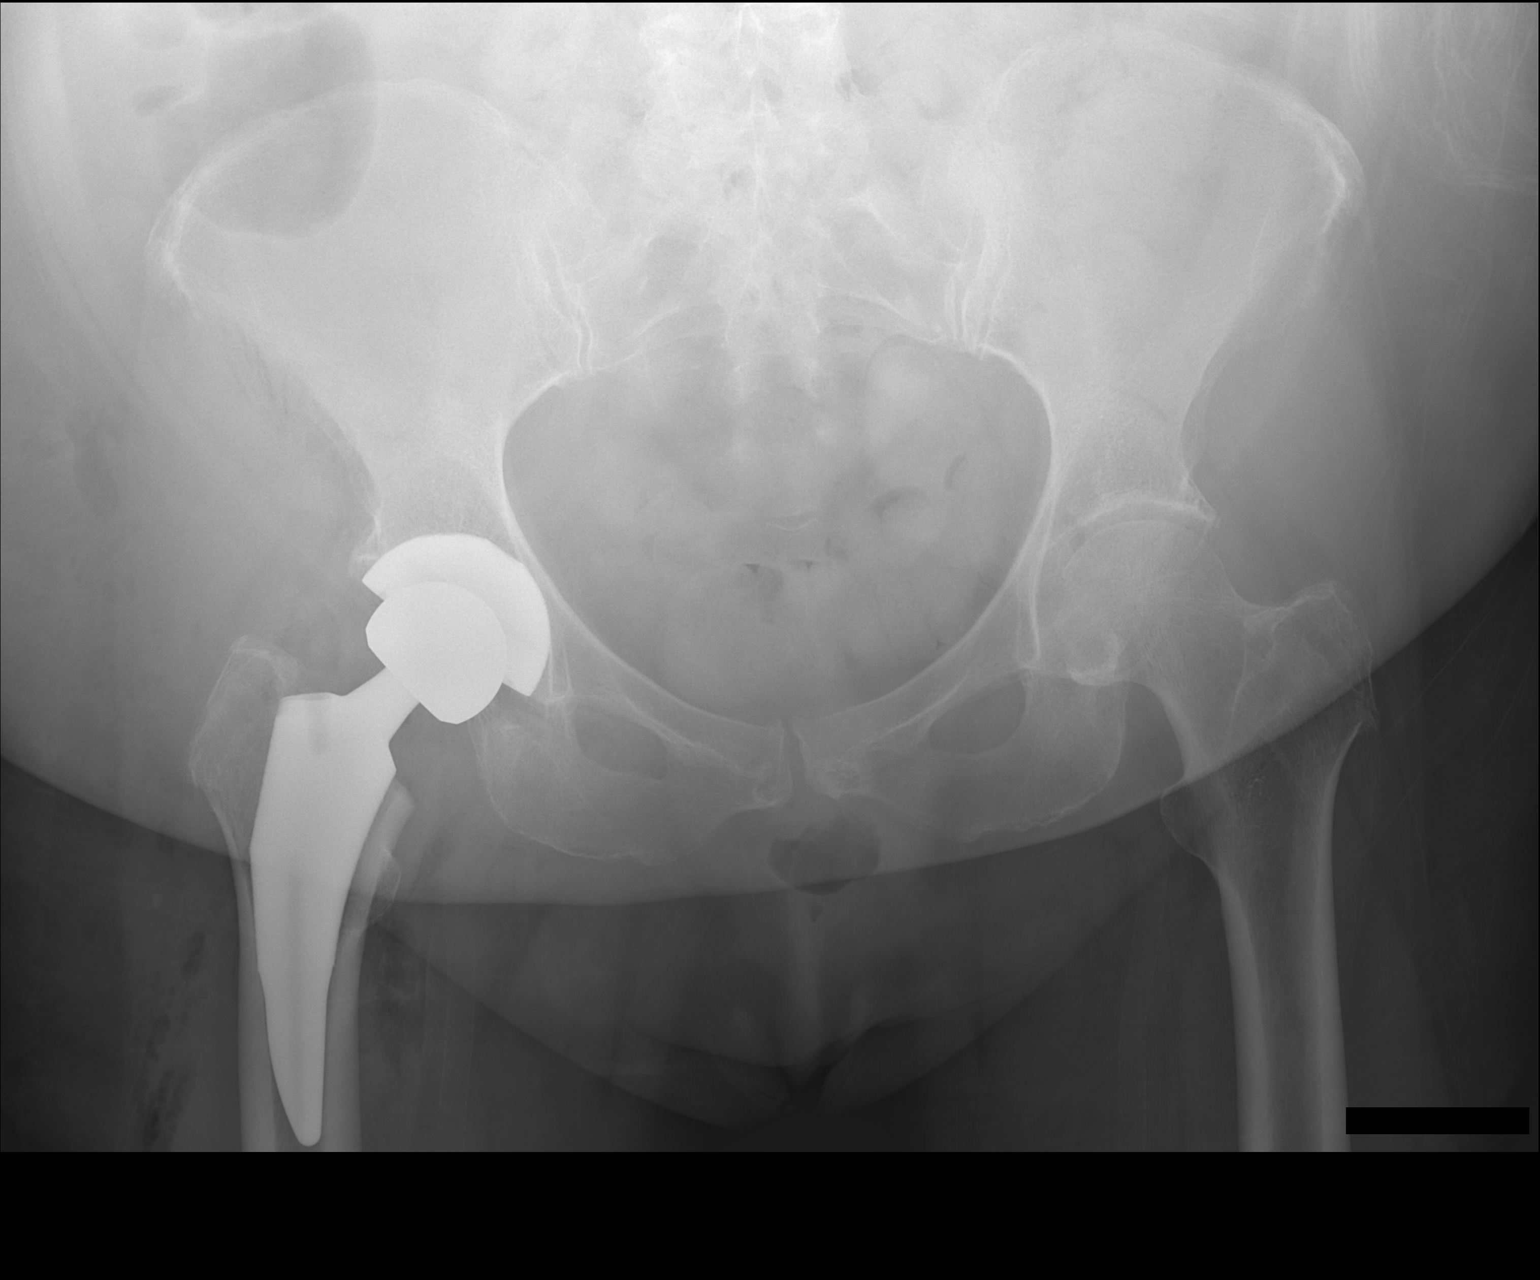

[1 of 1 positions shown; findings below may reference images not displayed]

FINDINGS: Total right hip arthroplasty. Hardware intact. Good anatomic
alignment.
IMPRESSION: Total right hip arthroplasty with good anatomic alignment.

## 2017-07-09 IMAGING — RF DG C-ARM 61-120 MIN
1 series · 2 of 2 positions shown · non-contrast
Comparison: None.

CLINICAL DATA: Right hip arthroplasty

EXAM:
DG C-ARM 61-120 MIN

[Series 1: run · 2 of 2 slices shown]
[im 1/2]
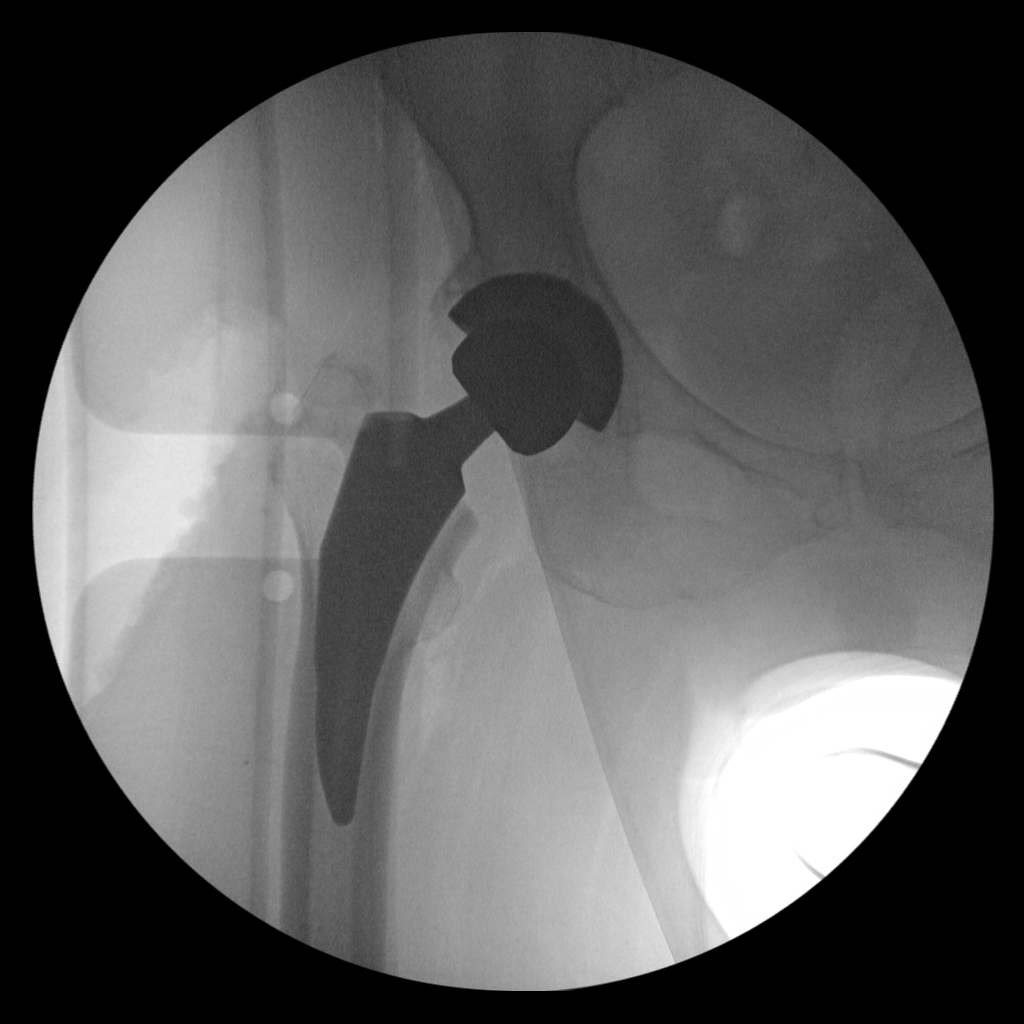
[im 2/2]
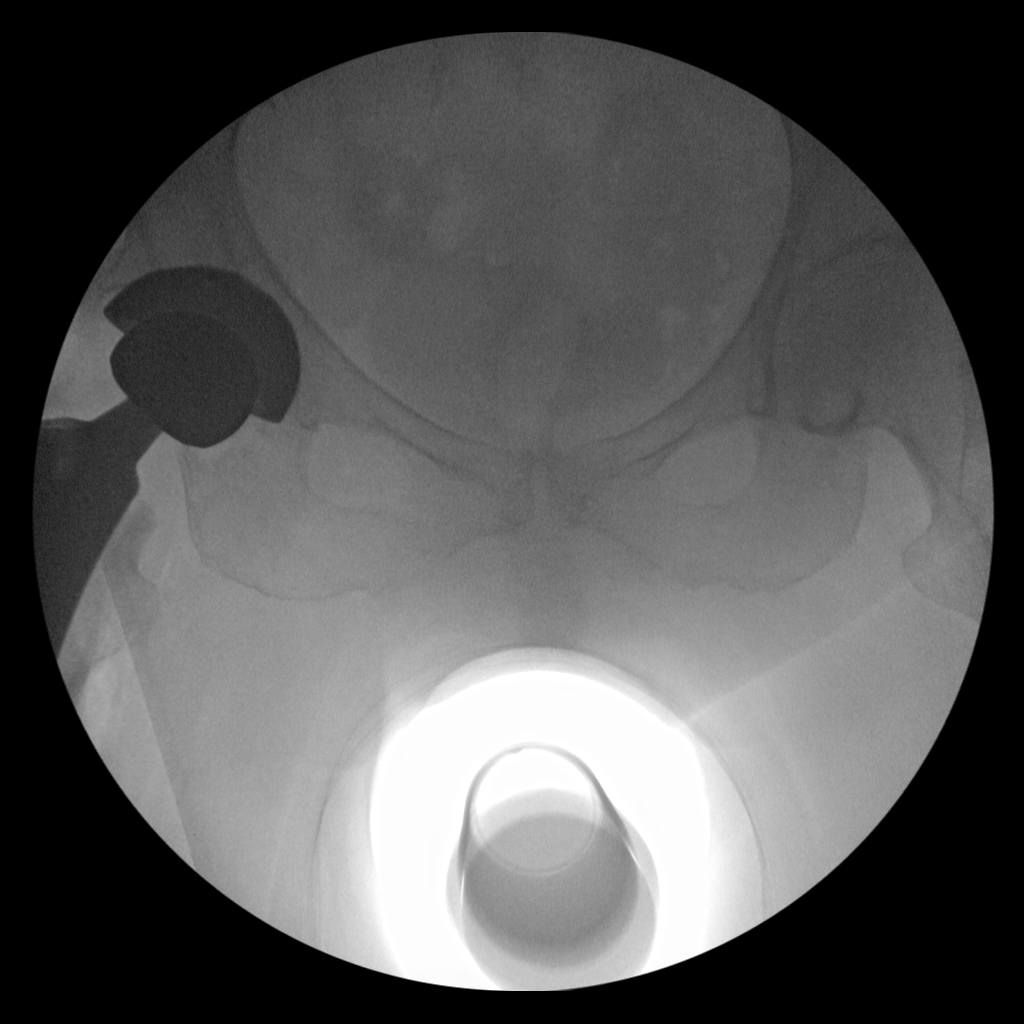

[2 of 2 positions shown; findings below may reference images not displayed]

FINDINGS: C-arm fluoroscopic views of the right hip after noncemented right
pressfit arthroplasty. No apparent hardware failure. Fine bony
detail limited by C-arm fluoroscopic technique but no gross
fractures.
IMPRESSION: Right hip arthroplasty without acute findings of hardware failure.
Alignment appears near anatomic.

## 2017-07-16 DIAGNOSIS — M25511 Pain in right shoulder: Secondary | ICD-10-CM | POA: Diagnosis not present

## 2017-07-23 DIAGNOSIS — M25511 Pain in right shoulder: Secondary | ICD-10-CM | POA: Diagnosis not present

## 2017-07-30 DIAGNOSIS — M25511 Pain in right shoulder: Secondary | ICD-10-CM | POA: Diagnosis not present

## 2017-07-30 DIAGNOSIS — M542 Cervicalgia: Secondary | ICD-10-CM | POA: Diagnosis not present

## 2017-08-04 DIAGNOSIS — M62838 Other muscle spasm: Secondary | ICD-10-CM | POA: Diagnosis not present

## 2017-08-04 DIAGNOSIS — M5412 Radiculopathy, cervical region: Secondary | ICD-10-CM | POA: Diagnosis not present

## 2017-08-04 DIAGNOSIS — M797 Fibromyalgia: Secondary | ICD-10-CM | POA: Diagnosis not present

## 2017-08-04 DIAGNOSIS — F5101 Primary insomnia: Secondary | ICD-10-CM | POA: Diagnosis not present

## 2017-08-15 DIAGNOSIS — M4802 Spinal stenosis, cervical region: Secondary | ICD-10-CM | POA: Diagnosis not present

## 2017-08-15 DIAGNOSIS — M50322 Other cervical disc degeneration at C5-C6 level: Secondary | ICD-10-CM | POA: Diagnosis not present

## 2017-08-15 DIAGNOSIS — M50323 Other cervical disc degeneration at C6-C7 level: Secondary | ICD-10-CM | POA: Diagnosis not present

## 2017-08-19 DIAGNOSIS — M542 Cervicalgia: Secondary | ICD-10-CM | POA: Diagnosis not present

## 2017-08-27 DIAGNOSIS — M25611 Stiffness of right shoulder, not elsewhere classified: Secondary | ICD-10-CM | POA: Diagnosis not present

## 2017-08-27 DIAGNOSIS — M12811 Other specific arthropathies, not elsewhere classified, right shoulder: Secondary | ICD-10-CM | POA: Diagnosis not present

## 2017-09-04 DIAGNOSIS — J309 Allergic rhinitis, unspecified: Secondary | ICD-10-CM | POA: Diagnosis not present

## 2017-09-04 DIAGNOSIS — M75101 Unspecified rotator cuff tear or rupture of right shoulder, not specified as traumatic: Secondary | ICD-10-CM | POA: Diagnosis not present

## 2017-09-04 DIAGNOSIS — E782 Mixed hyperlipidemia: Secondary | ICD-10-CM | POA: Diagnosis not present

## 2017-09-04 DIAGNOSIS — M797 Fibromyalgia: Secondary | ICD-10-CM | POA: Diagnosis not present

## 2017-09-04 DIAGNOSIS — I10 Essential (primary) hypertension: Secondary | ICD-10-CM | POA: Diagnosis not present

## 2017-09-04 DIAGNOSIS — Z01818 Encounter for other preprocedural examination: Secondary | ICD-10-CM | POA: Diagnosis not present

## 2017-09-08 DIAGNOSIS — I10 Essential (primary) hypertension: Secondary | ICD-10-CM | POA: Diagnosis not present

## 2017-09-08 DIAGNOSIS — Z01810 Encounter for preprocedural cardiovascular examination: Secondary | ICD-10-CM | POA: Diagnosis not present

## 2017-09-12 DIAGNOSIS — M75101 Unspecified rotator cuff tear or rupture of right shoulder, not specified as traumatic: Secondary | ICD-10-CM | POA: Diagnosis not present

## 2017-09-12 DIAGNOSIS — E782 Mixed hyperlipidemia: Secondary | ICD-10-CM | POA: Diagnosis not present

## 2017-09-17 NOTE — Pre-Procedure Instructions (Signed)
Evelyn Valdez  09/17/2017      [id number] - [place], [place] - [address] [place] [address] Phone: [phone] Fax: [phone]    Your procedure is scheduled on Thursday, September 25, 2017  Report to [facility] Admitting Entrance "A" at 10:30AM  Call this number if you have problems the morning of surgery:  [phone]   Remember:  Do not eat food or drink liquids after midnight.  Take these medicines the morning of surgery: If needed Albuterol Inhaler for cough or wheezing (Bring with you the day of surgery).  As of today, stop taking all Aspirins, Vitamins, Fish oils, and Herbal medications. Also stop all NSAIDS i.e. Advil, Ibuprofen, Motrin, Aleve, Anaprox, Naproxen, BC and Goody Powders.   Do not wear jewelry, make-up or nail polish.  Do not wear lotions, powders, perfumes, or deodorant.  Do not shave 48 hours prior to surgery.   Do not bring valuables to the hospital.  [facility] is not responsible for any belongings or valuables.  Contacts, dentures or bridgework may not be worn into surgery.  Leave your suitcase in the car.  After surgery it may be brought to your room.  For patients admitted to the hospital, discharge time will be determined by your treatment team.  Patients discharged the day of surgery will not be allowed to drive home.   Special instructions:    [facility]- Preparing For Surgery  Before surgery, you can play an important role. Because skin is not sterile, your skin needs to be as free of germs as possible. You can reduce the number of germs on your skin by washing with CHG (chlorahexidine gluconate) Soap before surgery.  CHG is an antiseptic cleaner which kills germs and bonds with the skin to continue killing germs even after washing.  Please do not use if you have an allergy to CHG or antibacterial soaps. If your skin becomes reddened/irritated stop using the CHG.  Do not shave  (including legs and underarms) for at least 48 hours prior to first CHG shower. It is OK to shave your face.  Please follow these instructions carefully.   1. Shower the NIGHT BEFORE SURGERY and the MORNING OF SURGERY with CHG.   2. If you chose to wash your hair, wash your hair first as usual with your normal shampoo.  3. After you shampoo, rinse your hair and body thoroughly to remove the shampoo.  4. Use CHG as you would any other liquid soap. You can apply CHG directly to the skin and wash gently with a scrungie or a clean washcloth.   5. Apply the CHG Soap to your body ONLY FROM THE NECK DOWN.  Do not use on open wounds or open sores. Avoid contact with your eyes, ears, mouth and genitals (private parts). Wash Face and genitals (private parts)  with your normal soap.  6. Wash thoroughly, paying special attention to the area where your surgery will be performed.  7. Thoroughly rinse your body with warm water from the neck down.  8. DO NOT shower/wash with your normal soap after using and rinsing off the CHG Soap.  9. Pat yourself dry with a CLEAN TOWEL.  10. Wear CLEAN PAJAMAS to bed the night before surgery, wear comfortable clothes the morning of surgery  11. Place CLEAN SHEETS on your bed the night of your first shower and DO NOT SLEEP WITH PETS.  Day of Surgery: Do not  apply any deodorants/lotions. Please wear clean clothes to the hospital/surgery center.    Please read over the following fact sheets that you were given. Pain Booklet, Coughing and Deep Breathing, Total Joint Packet, MRSA Information and Surgical Site Infection Prevention

## 2017-09-18 ENCOUNTER — Encounter (HOSPITAL_COMMUNITY): Payer: Self-pay

## 2017-09-18 ENCOUNTER — Other Ambulatory Visit: Payer: Self-pay

## 2017-09-18 ENCOUNTER — Encounter (HOSPITAL_COMMUNITY)
Admission: RE | Admit: 2017-09-18 | Discharge: 2017-09-18 | Disposition: A | Discharge status: Home / Self Care | Payer: PPO | Source: Ambulatory Visit | Attending: Orthopedic Surgery | Admitting: Orthopedic Surgery

## 2017-09-18 DIAGNOSIS — Z01818 Encounter for other preprocedural examination: Secondary | ICD-10-CM | POA: Insufficient documentation

## 2017-09-18 DIAGNOSIS — M75101 Unspecified rotator cuff tear or rupture of right shoulder, not specified as traumatic: Secondary | ICD-10-CM | POA: Diagnosis not present

## 2017-09-18 HISTORY — DX: Primary osteoarthritis, right shoulder: M19.011

## 2017-09-18 HISTORY — DX: Anemia, unspecified: D64.9

## 2017-09-18 LAB — BASIC METABOLIC PANEL
ANION GAP: 11 (ref 5–15)
BUN: 12 mg/dL (ref 6–20)
CALCIUM: 9.3 mg/dL (ref 8.9–10.3)
CO2: 24 mmol/L (ref 22–32)
CREATININE: 0.8 mg/dL (ref 0.44–1.00)
Chloride: 103 mmol/L (ref 101–111)
GFR calc non Af Amer: >60 mL/min (ref >60)
Glucose, Bld: 109 mg/dL — ABNORMAL HIGH (ref 65–99)
Potassium: 3.9 mmol/L (ref 3.5–5.1)
SODIUM: 138 mmol/L (ref 135–145)

## 2017-09-18 LAB — CBC
HCT: 38.3 % (ref 36.0–46.0)
HEMOGLOBIN: 11.7 g/dL — AB (ref 12.0–15.0)
MCH: 26.2 pg (ref 26.0–34.0)
MCHC: 30.5 g/dL (ref 30.0–36.0)
MCV: 85.9 fL (ref 78.0–100.0)
Platelets: 322 10*3/uL (ref 150–400)
RBC: 4.46 MIL/uL (ref 3.87–5.11)
RDW: 16.6 % — ABNORMAL HIGH (ref 11.5–15.5)
WBC: 7.3 10*3/uL (ref 4.0–10.5)

## 2017-09-18 LAB — SURGICAL PCR SCREEN
MRSA, PCR: NEGATIVE
Staphylococcus aureus: NEGATIVE

## 2017-09-18 NOTE — Progress Notes (Signed)
PCP - Dr. Beckie Busing- [facility]  Cardiologist - Denies  Chest x-ray - 09/04/17 (CE)  EKG - 09/08/17 (CE)  Stress Test - Denies  ECHO - Denies  Cardiac Cath - Denies  Sleep Study - No CPAP - None  LABS- 09/18/17: CBC, BMP   Anesthesia- No  Pt denies having chest pain, sob, or fever at this time. All instructions explained to the pt, with a verbal understanding of the material. Pt agrees to go over the instructions while at home for a better understanding. The opportunity to ask questions was provided.

## 2017-09-25 ENCOUNTER — Inpatient Hospital Stay (HOSPITAL_COMMUNITY)
Admission: RE | Admit: 2017-09-25 | Discharge: 2017-09-26 | DRG: 483 | Disposition: A | Discharge status: Home / Self Care | Payer: PPO | Source: Ambulatory Visit | Attending: Orthopedic Surgery | Admitting: Orthopedic Surgery

## 2017-09-25 ENCOUNTER — Inpatient Hospital Stay (HOSPITAL_COMMUNITY): Payer: PPO | Admitting: Anesthesiology

## 2017-09-25 ENCOUNTER — Encounter (HOSPITAL_COMMUNITY): Payer: Self-pay | Admitting: Urology

## 2017-09-25 ENCOUNTER — Encounter (HOSPITAL_COMMUNITY)
Admission: RE | Disposition: A | Discharge status: Home / Self Care | Payer: Self-pay | Source: Ambulatory Visit | Attending: Orthopedic Surgery

## 2017-09-25 ENCOUNTER — Inpatient Hospital Stay (HOSPITAL_COMMUNITY): Payer: PPO

## 2017-09-25 DIAGNOSIS — Z96611 Presence of right artificial shoulder joint: Secondary | ICD-10-CM

## 2017-09-25 DIAGNOSIS — Z880 Allergy status to penicillin: Secondary | ICD-10-CM | POA: Diagnosis not present

## 2017-09-25 DIAGNOSIS — J45909 Unspecified asthma, uncomplicated: Secondary | ICD-10-CM | POA: Diagnosis present

## 2017-09-25 DIAGNOSIS — E7800 Pure hypercholesterolemia, unspecified: Secondary | ICD-10-CM | POA: Diagnosis present

## 2017-09-25 DIAGNOSIS — Z6841 Body Mass Index (BMI) 40.0 and over, adult: Secondary | ICD-10-CM | POA: Diagnosis not present

## 2017-09-25 DIAGNOSIS — Z882 Allergy status to sulfonamides status: Secondary | ICD-10-CM

## 2017-09-25 DIAGNOSIS — M12811 Other specific arthropathies, not elsewhere classified, right shoulder: Secondary | ICD-10-CM | POA: Diagnosis present

## 2017-09-25 DIAGNOSIS — M8580 Other specified disorders of bone density and structure, unspecified site: Secondary | ICD-10-CM | POA: Diagnosis present

## 2017-09-25 DIAGNOSIS — M19011 Primary osteoarthritis, right shoulder: Secondary | ICD-10-CM | POA: Diagnosis not present

## 2017-09-25 DIAGNOSIS — M1611 Unilateral primary osteoarthritis, right hip: Secondary | ICD-10-CM | POA: Diagnosis not present

## 2017-09-25 DIAGNOSIS — Z885 Allergy status to narcotic agent status: Secondary | ICD-10-CM | POA: Diagnosis not present

## 2017-09-25 DIAGNOSIS — Z888 Allergy status to other drugs, medicaments and biological substances status: Secondary | ICD-10-CM

## 2017-09-25 DIAGNOSIS — Z471 Aftercare following joint replacement surgery: Secondary | ICD-10-CM | POA: Diagnosis not present

## 2017-09-25 DIAGNOSIS — M797 Fibromyalgia: Secondary | ICD-10-CM | POA: Diagnosis present

## 2017-09-25 DIAGNOSIS — F419 Anxiety disorder, unspecified: Secondary | ICD-10-CM | POA: Diagnosis present

## 2017-09-25 DIAGNOSIS — Z91048 Other nonmedicinal substance allergy status: Secondary | ICD-10-CM | POA: Diagnosis not present

## 2017-09-25 DIAGNOSIS — Z86718 Personal history of other venous thrombosis and embolism: Secondary | ICD-10-CM | POA: Diagnosis not present

## 2017-09-25 DIAGNOSIS — M129 Arthropathy, unspecified: Secondary | ICD-10-CM | POA: Diagnosis not present

## 2017-09-25 DIAGNOSIS — Z87442 Personal history of urinary calculi: Secondary | ICD-10-CM | POA: Diagnosis not present

## 2017-09-25 DIAGNOSIS — G8918 Other acute postprocedural pain: Secondary | ICD-10-CM | POA: Diagnosis not present

## 2017-09-25 DIAGNOSIS — I10 Essential (primary) hypertension: Secondary | ICD-10-CM | POA: Diagnosis present

## 2017-09-25 DIAGNOSIS — M75101 Unspecified rotator cuff tear or rupture of right shoulder, not specified as traumatic: Secondary | ICD-10-CM | POA: Diagnosis not present

## 2017-09-25 HISTORY — PX: REVERSE SHOULDER ARTHROPLASTY: SHX5054

## 2017-09-25 SURGERY — ARTHROPLASTY, SHOULDER, TOTAL, REVERSE
Anesthesia: General | Site: Shoulder | Laterality: Right

## 2017-09-25 MED ORDER — DULOXETINE HCL 60 MG PO CPEP
60.0000 mg | ORAL_CAPSULE | Freq: Every evening | ORAL | Status: DC
  Administered 2017-09-25: 60 mg via ORAL
  Filled 2017-09-25: qty 1

## 2017-09-25 MED ORDER — ATORVASTATIN CALCIUM 20 MG PO TABS
20.0000 mg | ORAL_TABLET | Freq: Every evening | ORAL | Status: DC
  Administered 2017-09-25: 20 mg via ORAL
  Filled 2017-09-25: qty 1

## 2017-09-25 MED ORDER — AMLODIPINE BESYLATE 10 MG PO TABS: 10.0000 mg | ORAL_TABLET | Freq: Every evening | ORAL | Status: DC

## 2017-09-25 MED ORDER — BUPIVACAINE-EPINEPHRINE (PF) 0.5% -1:200000 IJ SOLN
INTRAMUSCULAR | Status: AC
  Filled 2017-09-25: qty 30

## 2017-09-25 MED ORDER — 0.9 % SODIUM CHLORIDE (POUR BTL) OPTIME
TOPICAL | Status: DC | PRN
  Administered 2017-09-25: 1000 mL

## 2017-09-25 MED ORDER — SENNA 8.6 MG PO TABS
1.0000 | ORAL_TABLET | Freq: Two times a day (BID) | ORAL | Status: DC
  Administered 2017-09-25 – 2017-09-26 (×2): 8.6 mg via ORAL
  Filled 2017-09-25 (×3): qty 1

## 2017-09-25 MED ORDER — DEXAMETHASONE SODIUM PHOSPHATE 10 MG/ML IJ SOLN
INTRAMUSCULAR | Status: DC | PRN
  Administered 2017-09-25: 5 mg via INTRAVENOUS

## 2017-09-25 MED ORDER — DEXAMETHASONE SODIUM PHOSPHATE 10 MG/ML IJ SOLN
INTRAMUSCULAR | Status: AC
  Filled 2017-09-25: qty 1

## 2017-09-25 MED ORDER — ACETAMINOPHEN 500 MG PO TABS
1000.0000 mg | ORAL_TABLET | Freq: Four times a day (QID) | ORAL | Status: DC
  Administered 2017-09-25 – 2017-09-26 (×3): 1000 mg via ORAL
  Filled 2017-09-25 (×3): qty 2

## 2017-09-25 MED ORDER — RIVAROXABAN 10 MG PO TABS
10.0000 mg | ORAL_TABLET | Freq: Every day | ORAL | Status: DC
  Administered 2017-09-26: 10 mg via ORAL
  Filled 2017-09-25: qty 1

## 2017-09-25 MED ORDER — ONDANSETRON HCL 4 MG/2ML IJ SOLN: 4.0000 mg | Freq: Four times a day (QID) | INTRAMUSCULAR | Status: DC | PRN

## 2017-09-25 MED ORDER — PHENYLEPHRINE 40 MCG/ML (10ML) SYRINGE FOR IV PUSH (FOR BLOOD PRESSURE SUPPORT)
PREFILLED_SYRINGE | INTRAVENOUS | Status: DC | PRN
  Administered 2017-09-25: 80 ug via INTRAVENOUS
  Administered 2017-09-25: 120 ug via INTRAVENOUS
  Administered 2017-09-25: 100 ug via INTRAVENOUS

## 2017-09-25 MED ORDER — FENTANYL CITRATE (PF) 100 MCG/2ML IJ SOLN
50.0000 ug | Freq: Once | INTRAMUSCULAR | Status: AC
  Administered 2017-09-25: 50 ug via INTRAVENOUS

## 2017-09-25 MED ORDER — FUROSEMIDE 20 MG PO TABS: 20.0000 mg | ORAL_TABLET | Freq: Every day | ORAL | Status: DC | PRN

## 2017-09-25 MED ORDER — MIDAZOLAM HCL 2 MG/2ML IJ SOLN
INTRAMUSCULAR | Status: AC
  Administered 2017-09-25: 2 mg via INTRAVENOUS
  Filled 2017-09-25: qty 2

## 2017-09-25 MED ORDER — ROCURONIUM BROMIDE 10 MG/ML (PF) SYRINGE
PREFILLED_SYRINGE | INTRAVENOUS | Status: DC | PRN
  Administered 2017-09-25: 50 mg via INTRAVENOUS
  Administered 2017-09-25: 20 mg via INTRAVENOUS

## 2017-09-25 MED ORDER — SUGAMMADEX SODIUM 200 MG/2ML IV SOLN
INTRAVENOUS | Status: DC | PRN
  Administered 2017-09-25: 192.4 mg via INTRAVENOUS

## 2017-09-25 MED ORDER — PHENYLEPHRINE HCL 10 MG/ML IJ SOLN
INTRAVENOUS | Status: DC | PRN
  Administered 2017-09-25: 60 ug/min via INTRAVENOUS

## 2017-09-25 MED ORDER — ONDANSETRON HCL 4 MG/2ML IJ SOLN
INTRAMUSCULAR | Status: AC
  Filled 2017-09-25: qty 2

## 2017-09-25 MED ORDER — CLINDAMYCIN PHOSPHATE 900 MG/50ML IV SOLN
INTRAVENOUS | Status: AC
  Filled 2017-09-25: qty 50

## 2017-09-25 MED ORDER — PROPOFOL 10 MG/ML IV BOLUS
INTRAVENOUS | Status: AC
  Filled 2017-09-25: qty 20

## 2017-09-25 MED ORDER — HYDROMORPHONE HCL 2 MG PO TABS
2.0000 mg | ORAL_TABLET | ORAL | Status: DC | PRN
  Administered 2017-09-25 – 2017-09-26 (×2): 2 mg via ORAL
  Filled 2017-09-25 (×2): qty 1

## 2017-09-25 MED ORDER — FENTANYL CITRATE (PF) 100 MCG/2ML IJ SOLN
INTRAMUSCULAR | Status: AC
  Administered 2017-09-25: 50 ug via INTRAVENOUS
  Filled 2017-09-25: qty 2

## 2017-09-25 MED ORDER — LIDOCAINE 2% (20 MG/ML) 5 ML SYRINGE
INTRAMUSCULAR | Status: DC | PRN
  Administered 2017-09-25: 40 mg via INTRAVENOUS

## 2017-09-25 MED ORDER — HYDROMORPHONE HCL 1 MG/ML IJ SOLN: 1.0000 mg | INTRAMUSCULAR | Status: DC | PRN

## 2017-09-25 MED ORDER — ROCURONIUM BROMIDE 10 MG/ML (PF) SYRINGE
PREFILLED_SYRINGE | INTRAVENOUS | Status: AC
  Filled 2017-09-25: qty 5

## 2017-09-25 MED ORDER — TIZANIDINE HCL 4 MG PO TABS
2.0000 mg | ORAL_TABLET | Freq: Three times a day (TID) | ORAL | Status: DC | PRN
  Administered 2017-09-26: 2 mg via ORAL
  Filled 2017-09-25: qty 1

## 2017-09-25 MED ORDER — ONDANSETRON HCL 4 MG/2ML IJ SOLN
INTRAMUSCULAR | Status: DC | PRN
  Administered 2017-09-25: 4 mg via INTRAVENOUS

## 2017-09-25 MED ORDER — SUGAMMADEX SODIUM 200 MG/2ML IV SOLN
INTRAVENOUS | Status: AC
  Filled 2017-09-25: qty 2

## 2017-09-25 MED ORDER — MIDAZOLAM HCL 2 MG/2ML IJ SOLN
2.0000 mg | Freq: Once | INTRAMUSCULAR | Status: AC
  Administered 2017-09-25: 2 mg via INTRAVENOUS

## 2017-09-25 MED ORDER — SODIUM CHLORIDE 0.9 % IV SOLN
2000.0000 mg | INTRAVENOUS | Status: AC
  Administered 2017-09-25: 2000 mg via TOPICAL
  Filled 2017-09-25: qty 20

## 2017-09-25 MED ORDER — TOPIRAMATE 25 MG PO TABS
25.0000 mg | ORAL_TABLET | Freq: Every day | ORAL | Status: DC
  Administered 2017-09-25: 25 mg via ORAL
  Filled 2017-09-25: qty 1

## 2017-09-25 MED ORDER — LIDOCAINE-EPINEPHRINE (PF) 1.5 %-1:200000 IJ SOLN
INTRAMUSCULAR | Status: DC | PRN
  Administered 2017-09-25: 5 mL via PERINEURAL

## 2017-09-25 MED ORDER — PHENYLEPHRINE 40 MCG/ML (10ML) SYRINGE FOR IV PUSH (FOR BLOOD PRESSURE SUPPORT)
PREFILLED_SYRINGE | INTRAVENOUS | Status: AC
  Filled 2017-09-25: qty 10

## 2017-09-25 MED ORDER — TRAZODONE HCL 50 MG PO TABS
25.0000 mg | ORAL_TABLET | Freq: Every day | ORAL | Status: DC
  Administered 2017-09-25: 25 mg via ORAL
  Filled 2017-09-25: qty 1

## 2017-09-25 MED ORDER — RIVAROXABAN 10 MG PO TABS: 10.0000 mg | ORAL_TABLET | Freq: Every day | ORAL | 0 refills | Status: DC

## 2017-09-25 MED ORDER — CLINDAMYCIN PHOSPHATE 900 MG/50ML IV SOLN
900.0000 mg | INTRAVENOUS | Status: AC
  Administered 2017-09-25: 900 mg via INTRAVENOUS

## 2017-09-25 MED ORDER — PROPOFOL 10 MG/ML IV BOLUS
INTRAVENOUS | Status: DC | PRN
  Administered 2017-09-25: 150 mg via INTRAVENOUS

## 2017-09-25 MED ORDER — VITAMIN D 1000 UNITS PO TABS
2000.0000 [IU] | ORAL_TABLET | Freq: Every day | ORAL | Status: DC
  Administered 2017-09-26: 2000 [IU] via ORAL
  Filled 2017-09-25: qty 2

## 2017-09-25 MED ORDER — LIDOCAINE 2% (20 MG/ML) 5 ML SYRINGE
INTRAMUSCULAR | Status: AC
  Filled 2017-09-25: qty 5

## 2017-09-25 MED ORDER — HYDROMORPHONE HCL 2 MG PO TABS: 2.0000 mg | ORAL_TABLET | ORAL | 0 refills | Status: DC | PRN

## 2017-09-25 MED ORDER — LACTATED RINGERS IV SOLN
INTRAVENOUS | Status: DC
  Administered 2017-09-25 (×2): via INTRAVENOUS

## 2017-09-25 MED ORDER — FENTANYL CITRATE (PF) 250 MCG/5ML IJ SOLN
INTRAMUSCULAR | Status: AC
Start: 2017-09-25 — End: 2017-09-25
  Filled 2017-09-25: qty 5

## 2017-09-25 MED ORDER — IRBESARTAN 300 MG PO TABS
300.0000 mg | ORAL_TABLET | Freq: Every day | ORAL | Status: DC
  Administered 2017-09-26: 300 mg via ORAL
  Filled 2017-09-25: qty 1

## 2017-09-25 MED ORDER — FENTANYL CITRATE (PF) 250 MCG/5ML IJ SOLN
INTRAMUSCULAR | Status: DC | PRN
  Administered 2017-09-25: 50 ug via INTRAVENOUS

## 2017-09-25 MED ORDER — CHLORHEXIDINE GLUCONATE 4 % EX LIQD: 60.0000 mL | Freq: Once | CUTANEOUS | Status: DC

## 2017-09-25 MED ORDER — ONDANSETRON HCL 4 MG PO TABS: 4.0000 mg | ORAL_TABLET | Freq: Four times a day (QID) | ORAL | Status: DC | PRN

## 2017-09-25 MED ORDER — ROPIVACAINE HCL 7.5 MG/ML IJ SOLN
INTRAMUSCULAR | Status: DC | PRN
  Administered 2017-09-25: 20 mL via PERINEURAL

## 2017-09-25 SURGICAL SUPPLY — 60 items
BIT DRILL 5/64X5 DISP (BIT) ×3 IMPLANT
BIT DRILL TWIST 2.7 (BIT) IMPLANT
BIT DRILL TWIST 2.7MM (BIT)
BLADE SAG 18X100X1.27 (BLADE) ×3 IMPLANT
CAPT SHLDR REVTOTAL 2 ×3 IMPLANT
CLOSURE WOUND 1/2 X4 (GAUZE/BANDAGES/DRESSINGS) ×2
COVER SURGICAL LIGHT HANDLE (MISCELLANEOUS) ×3 IMPLANT
DRAPE IMP U-DRAPE 54X76 (DRAPES) ×6 IMPLANT
DRAPE INCISE IOBAN 66X45 STRL (DRAPES) ×3 IMPLANT
DRAPE ORTHO SPLIT 77X108 STRL (DRAPES) ×6
DRAPE SURG ORHT 6 SPLT 77X108 (DRAPES) ×2 IMPLANT
DRAPE U-SHAPE 47X51 STRL (DRAPES) ×3 IMPLANT
DRSG AQUACEL AG ADV 3.5X10 (GAUZE/BANDAGES/DRESSINGS) ×3 IMPLANT
DURAPREP 26ML APPLICATOR (WOUND CARE) ×3 IMPLANT
ELECT BLADE 4.0 EZ CLEAN MEGAD (MISCELLANEOUS) ×3
ELECT REM PT RETURN 9FT ADLT (ELECTROSURGICAL) ×3
ELECTRODE BLDE 4.0 EZ CLN MEGD (MISCELLANEOUS) ×1 IMPLANT
ELECTRODE REM PT RTRN 9FT ADLT (ELECTROSURGICAL) ×1 IMPLANT
GAUZE SPONGE 4X4 16PLY XRAY LF (GAUZE/BANDAGES/DRESSINGS) ×3 IMPLANT
GLOVE BIO SURGEON STRL SZ7.5 (GLOVE) ×3 IMPLANT
GLOVE BIOGEL PI IND STRL 8 (GLOVE) ×1 IMPLANT
GLOVE BIOGEL PI INDICATOR 8 (GLOVE) ×2
GOWN STRL REUS W/ TWL LRG LVL3 (GOWN DISPOSABLE) ×1 IMPLANT
GOWN STRL REUS W/ TWL XL LVL3 (GOWN DISPOSABLE) ×1 IMPLANT
GOWN STRL REUS W/TWL LRG LVL3 (GOWN DISPOSABLE) ×3
GOWN STRL REUS W/TWL XL LVL3 (GOWN DISPOSABLE) ×3
KIT BASIN OR (CUSTOM PROCEDURE TRAY) ×3 IMPLANT
KIT BEACH CHAIR TRIMANO (MISCELLANEOUS) IMPLANT
KIT ROOM TURNOVER OR (KITS) ×3 IMPLANT
MANIFOLD NEPTUNE II (INSTRUMENTS) ×3 IMPLANT
NEEDLE 1/2 CIR MAYO (NEEDLE) ×3 IMPLANT
NEEDLE HYPO 25GX1X1/2 BEV (NEEDLE) ×3 IMPLANT
NS IRRIG 1000ML POUR BTL (IV SOLUTION) ×3 IMPLANT
PACK SHOULDER (CUSTOM PROCEDURE TRAY) ×3 IMPLANT
PAD ARMBOARD 7.5X6 YLW CONV (MISCELLANEOUS) ×6 IMPLANT
PIN THREADED REVERSE (PIN) IMPLANT
SLING ARM FOAM STRAP LRG (SOFTGOODS) ×3 IMPLANT
SLING ARM FOAM STRAP MED (SOFTGOODS) IMPLANT
SPONGE LAP 18X18 X RAY DECT (DISPOSABLE) IMPLANT
SPONGE LAP 4X18 X RAY DECT (DISPOSABLE) ×3 IMPLANT
STRIP CLOSURE SKIN 1/2X4 (GAUZE/BANDAGES/DRESSINGS) ×4 IMPLANT
SUCTION FRAZIER HANDLE 10FR (MISCELLANEOUS) ×2
SUCTION TUBE FRAZIER 10FR DISP (MISCELLANEOUS) ×1 IMPLANT
SUT FIBERWIRE #2 38 T-5 BLUE (SUTURE) ×6
SUT MNCRL AB 4-0 PS2 18 (SUTURE) ×3 IMPLANT
SUT MON AB 2-0 CT1 36 (SUTURE) ×6 IMPLANT
SUT SILK 2 0 TIES 10X30 (SUTURE) ×2 IMPLANT
SUT VIC AB 0 CT1 27 (SUTURE) ×3
SUT VIC AB 0 CT1 27XBRD ANBCTR (SUTURE) ×1 IMPLANT
SUT VIC AB 1 CT1 27 (SUTURE)
SUT VIC AB 1 CT1 27XBRD ANBCTR (SUTURE) IMPLANT
SUT VIC AB 2-0 CT1 27 (SUTURE) ×3
SUT VIC AB 2-0 CT1 TAPERPNT 27 (SUTURE) ×1 IMPLANT
SUTURE FIBERWR #2 38 T-5 BLUE (SUTURE) ×2 IMPLANT
SYR CONTROL 10ML LL (SYRINGE) ×3 IMPLANT
TOWEL OR 17X24 6PK STRL BLUE (TOWEL DISPOSABLE) ×3 IMPLANT
TOWEL OR 17X26 10 PK STRL BLUE (TOWEL DISPOSABLE) ×3 IMPLANT
TOWER CARTRIDGE SMART MIX (DISPOSABLE) IMPLANT
WATER STERILE IRR 1000ML POUR (IV SOLUTION) ×3 IMPLANT
YANKAUER SUCT BULB TIP NO VENT (SUCTIONS) ×6 IMPLANT

## 2017-09-25 NOTE — Anesthesia Procedure Notes (Signed)
Anesthesia Regional Block: Interscalene brachial plexus block   Pre-Anesthetic Checklist: ,, timeout performed, Correct Patient, Correct Site, Correct Laterality, Correct Procedure, Correct Position, site marked, Risks and benefits discussed, pre-op evaluation,  At surgeon's request and post-op pain management  Laterality: Right  Prep: chloraprep       Needles:   Needle Type: Echogenic Needle     Needle Length: 9cm  Needle Gauge: 21     Additional Needles:   Procedures:,,,, ultrasound used (permanent image in chart),,,,  Narrative:  Start time: 09/25/2017 10:24 AM End time: 09/25/2017 10:33 AM Injection made incrementally with aspirations every 5 mL. Anesthesiologist: Lyndle Herrlich, MD

## 2017-09-25 NOTE — H&P (Signed)
ORTHOPAEDIC CONSULTATION  REQUESTING PHYSICIAN: Nicholes Stairs, MD  PCP:  Leota Jacobsen, MD  Chief Complaint: Right shoulder rotator cuff arthropathy  HPI: Evelyn Valdez is a 77 y.o. female who complains of recalcitrant right shoulder pain and dysfunction secondary to pain and rotator cuff disease.  We have managed her conservatively for the past year and a half and have elected to proceed with reverse shoulder arthroplasty.  She presents today for that surgery.  She has no new complaints at this time.  She denies recent fevers, night sweats or chills.  Past Medical History:  Diagnosis Date  . Anemia   . Anxiety   . Asthma    "well controlled"  . Depression   . Diverticulosis   . Dizziness   . DVT (deep venous thrombosis) ([place])    right lower leg after knee surgery  . Dyspnea    with exertion  . Fibromyalgia   . High cholesterol   . History of anemia   . History of bronchitis   . History of kidney stones   . History of pneumonia   . Hypertension   . Insomnia   . Localized osteoarthritis of right shoulder   . Obesity   . Osteoarthritis   . Osteopenia   . Peripheral neuropathy    takes Topamax  . Vitamin D deficiency   . Wears glasses    Past Surgical History:  Procedure Laterality Date  . ABDOMINAL HYSTERECTOMY    . APPENDECTOMY    . BACK SURGERY    . BREAST BIOPSY Right    x2  . BREAST LUMPECTOMY Left   . CHOLECYSTECTOMY    . COLONOSCOPY W/ POLYPECTOMY    . ESOPHAGOGASTRODUODENOSCOPY    . EYE SURGERY Bilateral    cataracts  . FOOT FRACTURE SURGERY Left   . KNEE ARTHROPLASTY Right   . KNEE CARTILAGE SURGERY Right    x3  . MENISCUS REPAIR Left   . PLANTAR FASCIA SURGERY Bilateral   . RECTOCELE REPAIR    . SHOULDER SURGERY Right   . TOTAL HIP ARTHROPLASTY Right 06/24/2016  . TOTAL HIP ARTHROPLASTY Right 06/24/2016   Procedure: RIGHT TOTAL HIP ARTHROPLASTY ANTERIOR APPROACH;  Surgeon: Rod Can, MD;  Location: [place];  Service:  Orthopedics;  Laterality: Right;  . TUBAL LIGATION     Social History   Socioeconomic History  . Marital status: Married    Spouse name: None  . Number of children: None  . Years of education: None  . Highest education level: None  Social Needs  . Financial resource strain: None  . Food insecurity - worry: None  . Food insecurity - inability: None  . Transportation needs - medical: None  . Transportation needs - non-medical: None  Occupational History  . None  Tobacco Use  . Smoking status: Never Smoker  . Smokeless tobacco: Never Used  Substance and Sexual Activity  . Alcohol use: No  . Drug use: No  . Sexual activity: None  Other Topics Concern  . None  Social History Narrative  . None   History reviewed. No pertinent family history. Allergies  Allergen Reactions  . Penicillins Swelling, Rash and Other (See Comments)    PATIENT HAS HAD A PCN REACTION WITH IMMEDIATE RASH, FACIAL/TONGUE/THROAT SWELLING, SOB, OR LIGHTHEADEDNESS WITH HYPOTENSION:  #  #  #  YES  #  #  #   Has patient had a PCN reaction causing severe rash involving mucus membranes or skin necrosis: No  PATIENT HAS HAD A PCN REACTION THAT REQUIRED HOSPITALIZATION:  #  #  #  YES  #  #  #  Has patient had a PCN reaction occurring within the last 10 years: No    . Sulfasalazine Rash  . Adhesive [Tape] Other (See Comments)    Pulled skin off  . Advair Diskus [Fluticasone-Salmeterol] Other (See Comments)    headaches  . Baclofen Other (See Comments)    Patient doesn't feel herself on this medication   . Erythromycin Nausea And Vomiting  . Hydrocodone Nausea And Vomiting  . Morphine And Related Nausea And Vomiting  . Nsaids Other (See Comments)  . Percocet [Oxycodone-Acetaminophen] Nausea And Vomiting  . Pregabalin     Other reaction(s): Other (See Comments) Weight gain  . Sulfa Antibiotics Nausea And Vomiting  . Nickel Rash   Prior to Admission medications   Medication Sig Start Date End Date Taking?  Authorizing Provider  albuterol (PROVENTIL HFA;VENTOLIN HFA) 108 (90 Base) MCG/ACT inhaler Inhale 1-2 puffs into the lungs every 6 (six) hours as needed for wheezing or shortness of breath.   Yes [provider]  amLODipine (NORVASC) 10 MG tablet Take 10 mg by mouth every evening. 04/11/16  Yes [provider]  atorvastatin (LIPITOR) 20 MG tablet Take 20 mg by mouth every evening. 04/13/16  Yes [provider]  Cholecalciferol (VITAMIN D3) 1000 units CAPS Take 2,000 Units by mouth daily.   Yes [provider]  diclofenac (VOLTAREN) 50 MG EC tablet Take 50 mg by mouth 3 (three) times daily as needed for moderate pain.   Yes [provider]  DULoxetine (CYMBALTA) 60 MG capsule Take 60 mg by mouth every evening.  05/22/16  Yes [provider]  furosemide (LASIX) 20 MG tablet Take 20 mg by mouth daily as needed for edema.   Yes [provider]  irbesartan (AVAPRO) 300 MG tablet Take 300 mg by mouth daily.   Yes [provider]  L-Methylfolate-Algae-B12-B6 Glade [place]) 3-90.314-2-35 MG CAPS Take 2 capsules by mouth daily.   Yes [provider]  tiZANidine (ZANAFLEX) 4 MG tablet Take 2 mg by mouth 3 (three) times daily as needed for muscle spasms.   Yes [provider]  traZODone (DESYREL) 50 MG tablet Take 25 mg by mouth at bedtime.    Yes [provider]  mometasone (ASMANEX 60 METERED DOSES) 220 MCG/INH inhaler Inhale 2 puffs into the lungs 2 (two) times daily as needed (allergies).    [provider]  topiramate (TOPAMAX) 25 MG capsule Take 25 mg by mouth at bedtime.  05/15/16   [provider]   No results found.  Positive ROS: All other systems have been reviewed and were otherwise negative with the exception of those mentioned in the HPI and as above.  Physical Exam: General: Alert, no acute distress Cardiovascular: No pedal edema Respiratory: No cyanosis, no use of accessory  musculature GI: No organomegaly, abdomen is soft and non-tender Skin: No lesions in the area of chief complaint Neurologic: Sensation intact distally Psychiatric: Patient is competent for consent with normal mood and affect Lymphatic: No axillary or cervical lymphadenopathy    Assessment: 1.  Right shoulder rotator cuff arthropathy   Plan: -Plan for operative intervention today with reverse shoulder arthroplasty to the right shoulder.  Previously discussed this at length in the office.  All questions were solicited and answered to her satisfaction. - The risks, benefits, and alternatives were discussed with the patient. There are risks  associated with the surgery including, but not limited to, problems with anesthesia (death), infection, dislocation, fracture of bones, loosening or failure of implants, malunion, nonunion, hematoma (blood accumulation) which may require surgical drainage, blood clots, pulmonary embolism, nerve injury, and blood vessel injury. The patient understands these risks and elects to proceed. -Plan to admit to inpatient postoperatively for pain control and therapy.     Nicholes Stairs, MD Cell [phone]    09/25/2017 10:13 AM

## 2017-09-25 NOTE — Discharge Instructions (Signed)
Orthopedic discharge instructions:  -You are okay to move your right shoulder as tolerated.  This is encouraged.  When not performing active stretching and active range of motion you should wear your sling at all times the first 2 weeks after surgery.  This includes sleeping.  -For prevention of blood clots take Xarelto 10 mg once a day for 6 weeks.  -For pain control use Tylenol and/or ibuprofen along with Dilaudid as directed.  Apply ice to your incision for 20-30 minutes at a time every hour you are awake.  -Return to see Dr. Stann Mainland in 2 weeks for wound check.  -It is okay to shower with your postoperative bandage in place.  Do not remove the bandage until your follow-up appointment.

## 2017-09-25 NOTE — Anesthesia Procedure Notes (Signed)
Procedure Name: Intubation Date/Time: 09/25/2017 11:07 AM Performed by: Freddie Breech, CRNA Pre-anesthesia Checklist: Patient identified, Emergency Drugs available, Suction available and Patient being monitored Patient Re-evaluated:Patient Re-evaluated prior to induction Oxygen Delivery Method: Circle System Utilized Preoxygenation: Pre-oxygenation with 100% oxygen Induction Type: IV induction Ventilation: Mask ventilation without difficulty and Oral airway inserted - appropriate to patient size Laryngoscope Size: Mac and 3 Grade View: Grade I Tube type: Oral Tube size: 7.0 mm Number of attempts: 1 Airway Equipment and Method: Stylet and Oral airway Placement Confirmation: ETT inserted through vocal cords under direct vision,  positive ETCO2 and breath sounds checked- equal and bilateral Secured at: 21 cm Tube secured with: Tape Dental Injury: Teeth and Oropharynx as per pre-operative assessment

## 2017-09-25 NOTE — Op Note (Signed)
09/25/2017  1:05 PM  PATIENT:  Evelyn Valdez    PRE-OPERATIVE DIAGNOSIS:  Right shoulder rotator cuff arthroplasty  POST-OPERATIVE DIAGNOSIS:  Same  PROCEDURE:  RIGHT REVERSE SHOULDER ARTHROPLASTY  SURGEON:  Nicholes Stairs, MD  ASSISTANT: Laure Kidney, RNFA  ANESTHESIA:   General  ESTIMATED BLOOD LOSS: 200 cc  PREOPERATIVE INDICATIONS:  Evelyn Valdez is a  77 y.o. female with a diagnosis of Right shoulder rotator cuff arthroplasty who failed conservative measures and elected for surgical management.    The risks benefits and alternatives were discussed with the patient preoperatively including but not limited to the risks of infection, bleeding, nerve injury, cardiopulmonary complications, the need for revision surgery, dislocation, brachial plexus palsy, incomplete relief of pain, among others, and the patient was willing to proceed.  OPERATIVE IMPLANTS: Biomet size 11 mini humeral stem press-fit standard with a 44 mm reverse shoulder arthroplasty tray with a Standard liner and a 36 mm +3 glenosphere with a Mini baseplate and 3 locking screws and one central nonlocking screw.  OPERATIVE FINDINGS: There was indeed a superior rotator cuff tear with retained anchors 4.  These were removed.  She had abundant adhesions in the subdeltoid and subacromial space from prior mini open rotator cuff repair.  She had collapse noted on his well of the humeral head.  OPERATIVE PROCEDURE: The patient was brought to the operating room and placed in the supine position. General anesthesia was administered. IV antibiotics were given.  Time out was performed. The upper extremity was prepped and draped in usual sterile fashion. The patient was in a beachchair position. Deltopectoral approach was carried out.  Incision was carried out skin and 17 his tissue.  The cephalic vein was mobilized and the deltopectoral interval and moved laterally with the deltoid.  Deep retractors were placed.   There was extensive sun deltoid and subacromial scarring and adhesions.  These were released with Cobb and Bovie. The biceps was tenodesed to the pectoralis tendon with #2 Fiberwire. The subscapularis was released off of the bone, Utilizing a technique.  Releases were performed on the superior anterior and posterior aspect of the tendon to mobilize better.  I then performed circumferential releases of the humerus, and then dislocated the head, and then reamed with the reamer to the above named size.  I then applied the jig, and cut the humeral head in 30 of retroversion, and then turned my attention to the glenoid.  Deep retractors were placed, and I resected the labrum, and then placed a guidepin into the center position on the glenoid, with slight inferior inclination. I then reamed over the guidepin, and this created a small metaphyseal cancellus blush inferiorly, removing just the cartilage to the subchondral bone superiorly. The base plate was selected and impacted place, and then I secured it centrally with a nonlocking screw, and I had excellent purchase both inferiorly and superiorly. I placed a short locking screws on posterior aspect.  Anteriorly we did not put a locking screw due to the very thin anterior glenoid.  I then turned my attention to the glenosphere, and impacted this into place, placing slight inferior offset (set on C).   The glenoid sphere was completely seated, and had engagement of the [facility] taper. I then turned my attention back to the humerus.  I sequentially broached, and then trialed, and was found to restore soft tissue tension, and it had 2 finger tightness. Therefore the above named components were selected. The shoulder felt stable throughout functional  motion.   I then impacted the real prosthesis into place, as well as the real humeral tray, and reduced the shoulder. The shoulder had excellent motion, and was stable, and I irrigated the wounds copiously.    The  subscap tendon was not repaired.  I then irrigated the shoulder copiously once more, repaired the deltopectoral interval with #2 FiberWire, followed by a Vicryl for the deep fat layer, followed by subcutaneous 2-0 Monocryl with Running subcuticular 3-0 Monocryl for the skin and Steri-Strips and sterile gauze for the skin. The patient was awakened and returned back in stable and satisfactory condition. There no complications and She tolerated the procedure well.  All counts were correct 2.   Disposition: Evelyn Valdez will be nonweightbearing to the right upper and active and passive range of motion as tolerated immediately.  She will be admitted postoperatively for pain control and monitoring.  She will wear her sling while sleeping for the first 2 weeks.  She will take Xarelto once daily 10 million, for DVT prophylaxis given her history of DVT.

## 2017-09-25 NOTE — Anesthesia Preprocedure Evaluation (Addendum)
Anesthesia Evaluation  Patient identified by MRN, date of birth, ID band Patient awake    Reviewed: Allergy & Precautions, H&P , Patient's Chart, lab work & pertinent test results, reviewed documented beta blocker date and time   Airway Mallampati: II  TM Distance: >3 FB Neck ROM: full    Dental no notable dental hx.    Pulmonary asthma ,    Pulmonary exam normal breath sounds clear to auscultation       Cardiovascular hypertension, On Medications  Rhythm:regular Rate:Normal     Neuro/Psych    GI/Hepatic   Endo/Other  Morbid obesity  Renal/GU      Musculoskeletal   Abdominal   Peds  Hematology   Anesthesia Other Findings   Reproductive/Obstetrics                             Anesthesia Physical Anesthesia Plan  ASA: III  Anesthesia Plan: General   Post-op Pain Management:  Regional for Post-op pain   Induction: Intravenous  PONV Risk Score and Plan: 3 and Dexamethasone, Ondansetron and Treatment may vary due to age or medical condition  Airway Management Planned: Oral ETT  Additional Equipment:   Intra-op Plan:   Post-operative Plan: Extubation in OR  Informed Consent: I have reviewed the patients History and Physical, chart, labs and discussed the procedure including the risks, benefits and alternatives for the proposed anesthesia with the patient or authorized representative who has indicated his/her understanding and acceptance.   Dental Advisory Given  Plan Discussed with: CRNA and Surgeon  Anesthesia Plan Comments: (  )       Anesthesia Quick Evaluation

## 2017-09-25 NOTE — Brief Op Note (Signed)
09/25/2017  1:04 PM  PATIENT:  Evelyn Valdez  77 y.o. female  PRE-OPERATIVE DIAGNOSIS:  Right shoulder rotator cuff arthroplasty  POST-OPERATIVE DIAGNOSIS:  Right shoulder rotator cuff arthroplasty  PROCEDURE:  Procedure(s) with comments: RIGHT REVERSE SHOULDER ARTHROPLASTY (Right) - 2.5 hrs  SURGEON:  Surgeon(s) and Role:    * Stann Mainland, Elly Modena, MD - Primary  PHYSICIAN ASSISTANT:   ASSISTANTS: Laure Kidney   ANESTHESIA:   regional and general  EBL:  200 mL   BLOOD ADMINISTERED:none  DRAINS: none   LOCAL MEDICATIONS USED:  MARCAINE     SPECIMEN:  No Specimen  DISPOSITION OF SPECIMEN:  N/A  COUNTS:  YES  TOURNIQUET:  * No tourniquets in log *  DICTATION: .Note written in EPIC  PLAN OF CARE: Admit to inpatient   PATIENT DISPOSITION:  PACU - hemodynamically stable.   Delay start of Pharmacological VTE agent (>24hrs) due to surgical blood loss or risk of bleeding: not applicable

## 2017-09-25 NOTE — Transfer of Care (Signed)
Immediate Anesthesia Transfer of Care Note  Patient: Evelyn Valdez  Procedure(s) Performed: RIGHT REVERSE SHOULDER ARTHROPLASTY (Right Shoulder)  Patient Location: PACU  Anesthesia Type:GA combined with regional for post-op pain  Level of Consciousness: drowsy and patient cooperative  Airway & Oxygen Therapy: Patient Spontanous Breathing and Patient connected to face mask oxygen  Post-op Assessment: Report given to RN and Post -op Vital signs reviewed and stable  Post vital signs: Reviewed and stable  Last Vitals:  Vitals:   09/25/17 1030 09/25/17 1035  BP: (!) 148/76 (!) 156/63  Pulse: (!) 101 97  Resp: 20 17  Temp:    SpO2: 97% 96%    Last Pain:  Vitals:   09/25/17 0908  TempSrc:   PainSc: 5          Complications: No apparent anesthesia complications

## 2017-09-26 ENCOUNTER — Encounter (HOSPITAL_COMMUNITY): Payer: Self-pay | Admitting: Orthopedic Surgery

## 2017-09-26 LAB — CBC
HCT: 30.9 % — ABNORMAL LOW (ref 36.0–46.0)
Hemoglobin: 9.4 g/dL — ABNORMAL LOW (ref 12.0–15.0)
MCH: 26.1 pg (ref 26.0–34.0)
MCHC: 30.4 g/dL (ref 30.0–36.0)
MCV: 85.8 fL (ref 78.0–100.0)
Platelets: 246 10*3/uL (ref 150–400)
RBC: 3.6 MIL/uL — AB (ref 3.87–5.11)
RDW: 16.5 % — AB (ref 11.5–15.5)
WBC: 14.1 10*3/uL — AB (ref 4.0–10.5)

## 2017-09-26 LAB — BASIC METABOLIC PANEL
ANION GAP: 8 (ref 5–15)
BUN: 16 mg/dL (ref 6–20)
CO2: 24 mmol/L (ref 22–32)
Calcium: 8.5 mg/dL — ABNORMAL LOW (ref 8.9–10.3)
Chloride: 105 mmol/L (ref 101–111)
Creatinine, Ser: 0.79 mg/dL (ref 0.44–1.00)
GFR calc Af Amer: >60 mL/min (ref >60)
GFR calc non Af Amer: >60 mL/min (ref >60)
GLUCOSE: 137 mg/dL — AB (ref 65–99)
POTASSIUM: 4.2 mmol/L (ref 3.5–5.1)
Sodium: 137 mmol/L (ref 135–145)

## 2017-09-26 NOTE — Progress Notes (Signed)
   Subjective:  Patient reports pain as mild to moderate.  No complaints of SOB/CP or N/V. She has voided and is tolerating a diet.   Objective:   VITALS:   Vitals:   09/25/17 1500 09/25/17 1936 09/25/17 2346 09/26/17 0547  BP: 128/60 122/66 126/70 (!) 111/50  Pulse: 89 (!) 106 (!) 104 78  Resp: 19 19 19 19   Temp: 97.8 F (36.6 C) 98.2 F (36.8 C) 98.2 F (36.8 C) 97.9 F (36.6 C)  TempSrc: Oral Oral Oral Oral  SpO2: 98% 94% 94% 94%  Weight:        Neurologically intact Neurovascular intact Sensation intact distally Intact pulses distally Incision: dressing C/D/I   Lab Results  Component Value Date   WBC 14.1 (H) 09/26/2017   HGB 9.4 (L) 09/26/2017   HCT 30.9 (L) 09/26/2017   MCV 85.8 09/26/2017   PLT 246 09/26/2017   BMET    Component Value Date/Time   NA 137 09/26/2017 0343   K 4.2 09/26/2017 0343   CL 105 09/26/2017 0343   CO2 24 09/26/2017 0343   GLUCOSE 137 (H) 09/26/2017 0343   BUN 16 09/26/2017 0343   CREATININE 0.79 09/26/2017 0343   CALCIUM 8.5 (L) 09/26/2017 0343   GFRNONAA >60 09/26/2017 0343   GFRAA >60 09/26/2017 0343     Assessment/Plan: 1 Day Post-Op   Active Problems:   Rotator cuff arthropathy of right shoulder   S/P reverse total shoulder arthroplasty, right   Advance diet Up with therapy Plan on dc home today Follow up with Stann Mainland in 2 weeks Sling to RUE Transition to xarelto x 6 weeks [post op   Nicholes Stairs 09/26/2017, 7:39 AM   Geralynn Rile, MD [phone]

## 2017-09-26 NOTE — Plan of Care (Signed)
  Pain Managment: General experience of comfort will improve 09/26/2017 0055 - Progressing by Irish Lack, RN   Clinical Measurements: Ability to maintain clinical measurements within normal limits will improve 09/26/2017 0055 - Progressing by Irish Lack, RN

## 2017-09-26 NOTE — Plan of Care (Signed)
  Adequate for Discharge Education: Knowledge of General Education information will improve 09/26/2017 0919 - Adequate for Discharge by Rance Muir, RN Health Behavior/Discharge Planning: Ability to manage health-related needs will improve 09/26/2017 0919 - Adequate for Discharge by Rance Muir, RN Clinical Measurements: Ability to maintain clinical measurements within normal limits will improve 09/26/2017 0919 - Adequate for Discharge by Rance Muir, RN Will remain free from infection 09/26/2017 0919 - Adequate for Discharge by Rance Muir, RN Diagnostic test results will improve 09/26/2017 0919 - Adequate for Discharge by Rance Muir, RN Respiratory complications will improve 09/26/2017 0919 - Adequate for Discharge by Rance Muir, RN Cardiovascular complication will be avoided 09/26/2017 0919 - Adequate for Discharge by Rance Muir, RN Activity: Risk for activity intolerance will decrease 09/26/2017 0919 - Adequate for Discharge by Rance Muir, RN Nutrition: Adequate nutrition will be maintained 09/26/2017 0919 - Adequate for Discharge by Rance Muir, RN Coping: Level of anxiety will decrease 09/26/2017 0919 - Adequate for Discharge by Rance Muir, RN Elimination: Will not experience complications related to bowel motility 09/26/2017 0919 - Adequate for Discharge by Rance Muir, RN Will not experience complications related to urinary retention 09/26/2017 0919 - Adequate for Discharge by Rance Muir, RN Pain Managment: General experience of comfort will improve 09/26/2017 0919 - Adequate for Discharge by Rance Muir, RN Safety: Ability to remain free from injury will improve 09/26/2017 0919 - Adequate for Discharge by Rance Muir, RN Skin Integrity: Risk for impaired skin integrity will decrease 09/26/2017 0919 - Adequate for Discharge by Rance Muir, RN

## 2017-09-29 NOTE — Discharge Summary (Signed)
Patient ID: Evelyn Valdez MRN: [id number] DOB/AGE: Apr 21, 1941 77 y.o.  Admit date: 09/25/2017 Discharge date: 09/26/2017  Primary Diagnosis: Right shoulder rotator cuff arthropathy  Admission Diagnoses:  Past Medical History:  Diagnosis Date  . Anemia   . Anxiety   . Asthma    "well controlled"  . Depression   . Diverticulosis   . Dizziness   . DVT (deep venous thrombosis) ([place])    right lower leg after knee surgery  . Dyspnea    with exertion  . Fibromyalgia   . High cholesterol   . History of anemia   . History of bronchitis   . History of kidney stones   . History of pneumonia   . Hypertension   . Insomnia   . Localized osteoarthritis of right shoulder   . Obesity   . Osteoarthritis   . Osteopenia   . Peripheral neuropathy    takes Topamax  . Vitamin D deficiency   . Wears glasses    Discharge Diagnoses:   Active Problems:   Rotator cuff arthropathy of right shoulder   S/P reverse total shoulder arthroplasty, right  Estimated body mass index is 40.06 kg/m as calculated from the following:   Height as of 09/18/17: 5' 1" (1.549 m).   Weight as of this encounter: 212 lb (96.2 kg).  Procedure:  Procedure(s) (LRB): RIGHT REVERSE SHOULDER ARTHROPLASTY (Right)   Consults: None  HPI: Evelyn Valdez is a right-handed 77 year old female with right shoulder rotator cuff arthropathy.  She presented to the hospital for elective reverse shoulder arthroplasty.  We purposely discussed this procedure as well as its risk and associated benefits preoperatively.  No new issues prior to presentation for surgery. Laboratory Data: Admission on 09/25/2017, Discharged on 09/26/2017  Component Date Value Ref Range Status  . WBC 09/26/2017 14.1* 4.0 - 10.5 K/uL Final  . RBC 09/26/2017 3.60* 3.87 - 5.11 MIL/uL Final  . Hemoglobin 09/26/2017 9.4* 12.0 - 15.0 g/dL Final  . HCT 09/26/2017 30.9* 36.0 - 46.0 % Final  . MCV 09/26/2017 85.8  78.0 - 100.0 fL Final  . MCH 09/26/2017 26.1   26.0 - 34.0 pg Final  . MCHC 09/26/2017 30.4  30.0 - 36.0 g/dL Final  . RDW 09/26/2017 16.5* 11.5 - 15.5 % Final  . Platelets 09/26/2017 246  150 - 400 K/uL Final  . Sodium 09/26/2017 137  135 - 145 mmol/L Final  . Potassium 09/26/2017 4.2  3.5 - 5.1 mmol/L Final  . Chloride 09/26/2017 105  101 - 111 mmol/L Final  . CO2 09/26/2017 24  22 - 32 mmol/L Final  . Glucose, Bld 09/26/2017 137* 65 - 99 mg/dL Final  . BUN 09/26/2017 16  6 - 20 mg/dL Final  . Creatinine, Ser 09/26/2017 0.79  0.44 - 1.00 mg/dL Final  . Calcium 09/26/2017 8.5* 8.9 - 10.3 mg/dL Final  . GFR calc non Af Amer 09/26/2017 >60  >60 mL/min Final  . GFR calc Af Amer 09/26/2017 >60  >60 mL/min Final   Comment: (NOTE) The eGFR has been calculated using the CKD EPI equation. This calculation has not been validated in all clinical situations. eGFR's persistently <60 mL/min signify possible Chronic Kidney Disease.   Georgiann Hahn gap 09/26/2017 8  5 - 15 Final  Hospital Outpatient Visit on 09/18/2017  Component Date Value Ref Range Status  . MRSA, PCR 09/18/2017 NEGATIVE  NEGATIVE Final  . Staphylococcus aureus 09/18/2017 NEGATIVE  NEGATIVE Final   Comment: (NOTE) The Xpert SA Assay (FDA approved  for NASAL specimens in patients 91 years of age and older), is one component of a comprehensive surveillance program. It is not intended to diagnose infection nor to guide or monitor treatment.   . Sodium 09/18/2017 138  135 - 145 mmol/L Final  . Potassium 09/18/2017 3.9  3.5 - 5.1 mmol/L Final  . Chloride 09/18/2017 103  101 - 111 mmol/L Final  . CO2 09/18/2017 24  22 - 32 mmol/L Final  . Glucose, Bld 09/18/2017 109* 65 - 99 mg/dL Final  . BUN 09/18/2017 12  6 - 20 mg/dL Final  . Creatinine, Ser 09/18/2017 0.80  0.44 - 1.00 mg/dL Final  . Calcium 09/18/2017 9.3  8.9 - 10.3 mg/dL Final  . GFR calc non Af Amer 09/18/2017 >60  >60 mL/min Final  . GFR calc Af Amer 09/18/2017 >60  >60 mL/min Final   Comment: (NOTE) The eGFR has been  calculated using the CKD EPI equation. This calculation has not been validated in all clinical situations. eGFR's persistently <60 mL/min signify possible Chronic Kidney Disease.   . Anion gap 09/18/2017 11  5 - 15 Final  . WBC 09/18/2017 7.3  4.0 - 10.5 K/uL Final  . RBC 09/18/2017 4.46  3.87 - 5.11 MIL/uL Final  . Hemoglobin 09/18/2017 11.7* 12.0 - 15.0 g/dL Final  . HCT 09/18/2017 38.3  36.0 - 46.0 % Final  . MCV 09/18/2017 85.9  78.0 - 100.0 fL Final  . MCH 09/18/2017 26.2  26.0 - 34.0 pg Final  . MCHC 09/18/2017 30.5  30.0 - 36.0 g/dL Final  . RDW 09/18/2017 16.6* 11.5 - 15.5 % Final  . Platelets 09/18/2017 322  150 - 400 K/uL Final     X-Rays:Dg Shoulder Right Port  Result Date: 09/25/2017 CLINICAL DATA:  Post RIGHT shoulder surgery EXAM: PORTABLE RIGHT SHOULDER COMPARISON:  Portable exam 1344 hours without priors for comparison FINDINGS: Osseous demineralization. Components of a RIGHT reverse shoulder arthroplasty are identified. No evidence of fracture dislocation seen on single AP view. Bone fragments are seen cranial to the prosthetic joint. AC joint alignment normal. Mild RIGHT basilar atelectasis. IMPRESSION: RIGHT reverse shoulder arthroplasty without acute abnormalities. Electronically Signed   By: Lavonia Dana M.D.   On: 09/25/2017 14:04    EKG: Orders placed or performed during the hospital encounter of 06/13/16  . EKG 12 lead  . EKG 12 lead     Hospital Course: Evelyn Valdez is a 77 y.o. who was admitted to Hospital. They were brought to the operating room on 09/25/2017 and underwent Procedure(s): RIGHT REVERSE SHOULDER ARTHROPLASTY.  Patient tolerated the procedure well and was later transferred to the recovery room and then to the orthopaedic floor for postoperative care.  They were given PO and IV analgesics for pain control following their surgery.  They were given 24 hours of postoperative antibiotics of  Anti-infectives (From admission, onward)   Start      Dose/Rate Route Frequency Ordered Stop   09/25/17 0844  clindamycin (CLEOCIN) 900 MG/50ML IVPB    Comments:  Forte, Lindsi   : cabinet override      09/25/17 0844 09/25/17 1115   09/25/17 0843  clindamycin (CLEOCIN) IVPB 900 mg     900 mg 100 mL/hr over 30 Minutes Intravenous On call to O.R. 09/25/17 5456 09/25/17 1145     and started on DVT prophylaxis in the form of Aspirin.   PT and OT were ordered for total joint protocol.  Discharge planning consulted to help with  postop disposition and equipment needs.  Patient had a good night on the evening of surgery.  They started to get up OOB with therapy on day one. The patient had progressed with therapy and meeting their goals.  Incision was healing well.  Patient was seen in rounds and was ready to go home.   Diet: Regular diet Activity:NWB Follow-up:in 2 weeks Disposition - Home Discharged Condition: good   Discharge Instructions    Call MD / Call 911   Complete by:  As directed    If you experience chest pain or shortness of breath, CALL 911 and be transported to the hospital emergency room.  If you develope a fever above 101 F, pus (white drainage) or increased drainage or redness at the wound, or calf pain, call your surgeon's office.   Constipation Prevention   Complete by:  As directed    Drink plenty of fluids.  Prune juice may be helpful.  You may use a stool softener, such as Colace (over the counter) 100 mg twice a day.  Use MiraLax (over the counter) for constipation as needed.   Diet - low sodium heart healthy   Complete by:  As directed    Increase activity slowly as tolerated   Complete by:  As directed      Allergies as of 09/26/2017      Reactions   Penicillins Swelling, Rash, Other (See Comments)   PATIENT HAS HAD A PCN REACTION WITH IMMEDIATE RASH, FACIAL/TONGUE/THROAT SWELLING, SOB, OR LIGHTHEADEDNESS WITH HYPOTENSION:  #  #  #  YES  #  #  #   Has patient had a PCN reaction causing severe rash involving mucus  membranes or skin necrosis: No PATIENT HAS HAD A PCN REACTION THAT REQUIRED HOSPITALIZATION:  #  #  #  YES  #  #  #  Has patient had a PCN reaction occurring within the last 10 years: No   Sulfasalazine Rash   Adhesive [tape] Other (See Comments)   Pulled skin off   Advair Diskus [fluticasone-salmeterol] Other (See Comments)   headaches   Baclofen Other (See Comments)   Patient doesn't feel herself on this medication    Erythromycin Nausea And Vomiting   Hydrocodone Nausea And Vomiting   Morphine And Related Nausea And Vomiting   Nsaids Other (See Comments)   Percocet [oxycodone-acetaminophen] Nausea And Vomiting   Pregabalin    Other reaction(s): Other (See Comments) Weight gain   Sulfa Antibiotics Nausea And Vomiting   Nickel Rash      Medication List    TAKE these medications   albuterol 108 (90 Base) MCG/ACT inhaler Commonly known as:  PROVENTIL HFA;VENTOLIN HFA Inhale 1-2 puffs into the lungs every 6 (six) hours as needed for wheezing or shortness of breath.   amLODipine 10 MG tablet Commonly known as:  NORVASC Take 10 mg by mouth every evening.   ASMANEX 60 METERED DOSES 220 MCG/INH inhaler Generic drug:  mometasone Inhale 2 puffs into the lungs 2 (two) times daily as needed (allergies).   atorvastatin 20 MG tablet Commonly known as:  LIPITOR Take 20 mg by mouth every evening.   diclofenac 50 MG EC tablet Commonly known as:  VOLTAREN Take 50 mg by mouth 3 (three) times daily as needed for moderate pain.   DULoxetine 60 MG capsule Commonly known as:  CYMBALTA Take 60 mg by mouth every evening.   furosemide 20 MG tablet Commonly known as:  LASIX Take 20 mg by mouth daily  as needed for edema.   HYDROmorphone 2 MG tablet Commonly known as:  DILAUDID Take 1-2 tablets (2-4 mg total) by mouth every 4 (four) hours as needed for severe pain.   irbesartan 300 MG tablet Commonly known as:  AVAPRO Take 300 mg by mouth daily.   METANX 3-90.314-2-35 MG Caps Take  2 capsules by mouth daily.   rivaroxaban 10 MG Tabs tablet Commonly known as:  XARELTO Take 1 tablet (10 mg total) by mouth daily with supper.   tiZANidine 4 MG tablet Commonly known as:  ZANAFLEX Take 2 mg by mouth 3 (three) times daily as needed for muscle spasms.   topiramate 25 MG capsule Commonly known as:  TOPAMAX Take 25 mg by mouth at bedtime.   traZODone 50 MG tablet Commonly known as:  DESYREL Take 25 mg by mouth at bedtime.   Vitamin D3 1000 units Caps Take 2,000 Units by mouth daily.      Follow-up Information    Nicholes Stairs, MD. Schedule an appointment as soon as possible for a visit in 2 weeks.   Specialty:  Orthopedic Surgery Why:  For wound re-check Contact information: [address] [place] [address] [phone]           Signed: Geralynn Rile, MD Orthopaedic Surgery 09/29/2017, 10:41 PM

## 2017-09-30 NOTE — Anesthesia Postprocedure Evaluation (Signed)
Anesthesia Post Note  Patient: AMMANDA DOBBINS  Procedure(s) Performed: RIGHT REVERSE SHOULDER ARTHROPLASTY (Right Shoulder)     Patient location during evaluation: PACU Anesthesia Type: General Level of consciousness: awake and alert Pain management: pain level controlled Vital Signs Assessment: post-procedure vital signs reviewed and stable Respiratory status: spontaneous breathing, nonlabored ventilation, respiratory function stable and patient connected to nasal cannula oxygen Cardiovascular status: blood pressure returned to baseline and stable Postop Assessment: no apparent nausea or vomiting Anesthetic complications: no    Last Vitals:  Vitals:   09/25/17 2346 09/26/17 0547  BP: 126/70 (!) 111/50  Pulse: (!) 104 78  Resp: 19 19  Temp: 36.8 C 36.6 C  SpO2: 94% 94%    Last Pain:  Vitals:   09/26/17 0547  TempSrc: Oral  PainSc:                  Riccardo Dubin

## 2017-10-08 DIAGNOSIS — R29898 Other symptoms and signs involving the musculoskeletal system: Secondary | ICD-10-CM | POA: Diagnosis not present

## 2017-10-08 DIAGNOSIS — Z4789 Encounter for other orthopedic aftercare: Secondary | ICD-10-CM | POA: Diagnosis not present

## 2017-10-08 DIAGNOSIS — G8929 Other chronic pain: Secondary | ICD-10-CM | POA: Diagnosis not present

## 2017-10-08 DIAGNOSIS — M25511 Pain in right shoulder: Secondary | ICD-10-CM | POA: Diagnosis not present

## 2017-11-03 DIAGNOSIS — L308 Other specified dermatitis: Secondary | ICD-10-CM | POA: Diagnosis not present

## 2017-11-03 DIAGNOSIS — M75101 Unspecified rotator cuff tear or rupture of right shoulder, not specified as traumatic: Secondary | ICD-10-CM | POA: Diagnosis not present

## 2017-11-04 DIAGNOSIS — M25511 Pain in right shoulder: Secondary | ICD-10-CM | POA: Diagnosis not present

## 2017-11-04 DIAGNOSIS — Z4889 Encounter for other specified surgical aftercare: Secondary | ICD-10-CM | POA: Diagnosis not present

## 2017-11-04 DIAGNOSIS — Z96611 Presence of right artificial shoulder joint: Secondary | ICD-10-CM | POA: Diagnosis not present

## 2017-11-04 DIAGNOSIS — Z4789 Encounter for other orthopedic aftercare: Secondary | ICD-10-CM | POA: Diagnosis not present

## 2017-11-04 DIAGNOSIS — Z471 Aftercare following joint replacement surgery: Secondary | ICD-10-CM | POA: Diagnosis not present

## 2017-11-04 DIAGNOSIS — R29898 Other symptoms and signs involving the musculoskeletal system: Secondary | ICD-10-CM | POA: Diagnosis not present

## 2017-11-04 DIAGNOSIS — G8929 Other chronic pain: Secondary | ICD-10-CM | POA: Diagnosis not present

## 2017-11-19 DIAGNOSIS — M1712 Unilateral primary osteoarthritis, left knee: Secondary | ICD-10-CM | POA: Diagnosis not present

## 2017-11-19 DIAGNOSIS — Z96641 Presence of right artificial hip joint: Secondary | ICD-10-CM | POA: Diagnosis not present

## 2017-11-19 DIAGNOSIS — M1612 Unilateral primary osteoarthritis, left hip: Secondary | ICD-10-CM | POA: Diagnosis not present

## 2017-11-19 DIAGNOSIS — M7061 Trochanteric bursitis, right hip: Secondary | ICD-10-CM | POA: Diagnosis not present

## 2017-11-26 DIAGNOSIS — M1612 Unilateral primary osteoarthritis, left hip: Secondary | ICD-10-CM | POA: Diagnosis not present

## 2017-12-01 DIAGNOSIS — M797 Fibromyalgia: Secondary | ICD-10-CM | POA: Diagnosis not present

## 2017-12-01 DIAGNOSIS — M5416 Radiculopathy, lumbar region: Secondary | ICD-10-CM | POA: Diagnosis not present

## 2017-12-01 DIAGNOSIS — M542 Cervicalgia: Secondary | ICD-10-CM | POA: Diagnosis not present

## 2017-12-01 DIAGNOSIS — G609 Hereditary and idiopathic neuropathy, unspecified: Secondary | ICD-10-CM | POA: Diagnosis not present

## 2017-12-05 DIAGNOSIS — Z4789 Encounter for other orthopedic aftercare: Secondary | ICD-10-CM | POA: Diagnosis not present

## 2017-12-05 DIAGNOSIS — M25511 Pain in right shoulder: Secondary | ICD-10-CM | POA: Diagnosis not present

## 2017-12-05 DIAGNOSIS — R29898 Other symptoms and signs involving the musculoskeletal system: Secondary | ICD-10-CM | POA: Diagnosis not present

## 2017-12-05 DIAGNOSIS — G8929 Other chronic pain: Secondary | ICD-10-CM | POA: Diagnosis not present

## 2017-12-05 DIAGNOSIS — M25611 Stiffness of right shoulder, not elsewhere classified: Secondary | ICD-10-CM | POA: Diagnosis not present

## 2017-12-10 DIAGNOSIS — M1612 Unilateral primary osteoarthritis, left hip: Secondary | ICD-10-CM | POA: Diagnosis not present

## 2017-12-17 DIAGNOSIS — M1612 Unilateral primary osteoarthritis, left hip: Secondary | ICD-10-CM | POA: Diagnosis not present

## 2017-12-23 DIAGNOSIS — Z96611 Presence of right artificial shoulder joint: Secondary | ICD-10-CM | POA: Diagnosis not present

## 2017-12-23 DIAGNOSIS — M1612 Unilateral primary osteoarthritis, left hip: Secondary | ICD-10-CM | POA: Diagnosis not present

## 2017-12-23 DIAGNOSIS — Z471 Aftercare following joint replacement surgery: Secondary | ICD-10-CM | POA: Diagnosis not present

## 2017-12-23 DIAGNOSIS — M87052 Idiopathic aseptic necrosis of left femur: Secondary | ICD-10-CM | POA: Diagnosis not present

## 2017-12-23 DIAGNOSIS — M25552 Pain in left hip: Secondary | ICD-10-CM | POA: Diagnosis not present

## 2017-12-30 DIAGNOSIS — R739 Hyperglycemia, unspecified: Secondary | ICD-10-CM | POA: Diagnosis not present

## 2017-12-30 DIAGNOSIS — M87052 Idiopathic aseptic necrosis of left femur: Secondary | ICD-10-CM | POA: Diagnosis not present

## 2017-12-30 DIAGNOSIS — Z86718 Personal history of other venous thrombosis and embolism: Secondary | ICD-10-CM | POA: Diagnosis not present

## 2017-12-30 DIAGNOSIS — D509 Iron deficiency anemia, unspecified: Secondary | ICD-10-CM | POA: Diagnosis not present

## 2018-01-05 DIAGNOSIS — Z1211 Encounter for screening for malignant neoplasm of colon: Secondary | ICD-10-CM | POA: Diagnosis not present

## 2018-01-06 DIAGNOSIS — G8929 Other chronic pain: Secondary | ICD-10-CM | POA: Diagnosis not present

## 2018-01-06 DIAGNOSIS — R29898 Other symptoms and signs involving the musculoskeletal system: Secondary | ICD-10-CM | POA: Diagnosis not present

## 2018-01-06 DIAGNOSIS — Z4789 Encounter for other orthopedic aftercare: Secondary | ICD-10-CM | POA: Diagnosis not present

## 2018-01-06 DIAGNOSIS — M25511 Pain in right shoulder: Secondary | ICD-10-CM | POA: Diagnosis not present

## 2018-01-06 DIAGNOSIS — M25611 Stiffness of right shoulder, not elsewhere classified: Secondary | ICD-10-CM | POA: Diagnosis not present

## 2018-01-13 DIAGNOSIS — D508 Other iron deficiency anemias: Secondary | ICD-10-CM | POA: Diagnosis not present

## 2018-01-13 DIAGNOSIS — R29898 Other symptoms and signs involving the musculoskeletal system: Secondary | ICD-10-CM | POA: Diagnosis not present

## 2018-01-13 DIAGNOSIS — Z4789 Encounter for other orthopedic aftercare: Secondary | ICD-10-CM | POA: Diagnosis not present

## 2018-01-13 DIAGNOSIS — M7061 Trochanteric bursitis, right hip: Secondary | ICD-10-CM | POA: Diagnosis not present

## 2018-01-13 DIAGNOSIS — M25511 Pain in right shoulder: Secondary | ICD-10-CM | POA: Diagnosis not present

## 2018-01-13 DIAGNOSIS — G8929 Other chronic pain: Secondary | ICD-10-CM | POA: Diagnosis not present

## 2018-01-14 DIAGNOSIS — Z86010 Personal history of colonic polyps: Secondary | ICD-10-CM | POA: Diagnosis not present

## 2018-01-14 DIAGNOSIS — D509 Iron deficiency anemia, unspecified: Secondary | ICD-10-CM | POA: Diagnosis not present

## 2018-01-22 DIAGNOSIS — K2930 Chronic superficial gastritis without bleeding: Secondary | ICD-10-CM | POA: Diagnosis not present

## 2018-01-22 DIAGNOSIS — D120 Benign neoplasm of cecum: Secondary | ICD-10-CM | POA: Diagnosis not present

## 2018-01-22 DIAGNOSIS — D500 Iron deficiency anemia secondary to blood loss (chronic): Secondary | ICD-10-CM | POA: Diagnosis not present

## 2018-01-22 DIAGNOSIS — Z86010 Personal history of colonic polyps: Secondary | ICD-10-CM | POA: Diagnosis not present

## 2018-01-22 DIAGNOSIS — K31819 Angiodysplasia of stomach and duodenum without bleeding: Secondary | ICD-10-CM | POA: Diagnosis not present

## 2018-01-22 DIAGNOSIS — D124 Benign neoplasm of descending colon: Secondary | ICD-10-CM | POA: Diagnosis not present

## 2018-01-22 DIAGNOSIS — K317 Polyp of stomach and duodenum: Secondary | ICD-10-CM | POA: Diagnosis not present

## 2018-01-22 DIAGNOSIS — K5730 Diverticulosis of large intestine without perforation or abscess without bleeding: Secondary | ICD-10-CM | POA: Diagnosis not present

## 2018-01-22 DIAGNOSIS — K3189 Other diseases of stomach and duodenum: Secondary | ICD-10-CM | POA: Diagnosis not present

## 2018-01-22 DIAGNOSIS — K642 Third degree hemorrhoids: Secondary | ICD-10-CM | POA: Diagnosis not present

## 2018-01-27 DIAGNOSIS — Z79899 Other long term (current) drug therapy: Secondary | ICD-10-CM | POA: Diagnosis not present

## 2018-01-27 DIAGNOSIS — D500 Iron deficiency anemia secondary to blood loss (chronic): Secondary | ICD-10-CM | POA: Diagnosis not present

## 2018-01-27 DIAGNOSIS — Z5181 Encounter for therapeutic drug level monitoring: Secondary | ICD-10-CM | POA: Diagnosis not present

## 2018-01-27 DIAGNOSIS — D472 Monoclonal gammopathy: Secondary | ICD-10-CM | POA: Diagnosis not present

## 2018-01-27 DIAGNOSIS — R7989 Other specified abnormal findings of blood chemistry: Secondary | ICD-10-CM | POA: Diagnosis not present

## 2018-01-27 DIAGNOSIS — D509 Iron deficiency anemia, unspecified: Secondary | ICD-10-CM | POA: Diagnosis not present

## 2018-01-30 DIAGNOSIS — D500 Iron deficiency anemia secondary to blood loss (chronic): Secondary | ICD-10-CM | POA: Diagnosis not present

## 2018-02-06 DIAGNOSIS — D500 Iron deficiency anemia secondary to blood loss (chronic): Secondary | ICD-10-CM | POA: Diagnosis not present

## 2018-02-16 DIAGNOSIS — M25511 Pain in right shoulder: Secondary | ICD-10-CM | POA: Diagnosis not present

## 2018-02-18 DIAGNOSIS — D500 Iron deficiency anemia secondary to blood loss (chronic): Secondary | ICD-10-CM | POA: Diagnosis not present

## 2018-02-26 DIAGNOSIS — H0100B Unspecified blepharitis left eye, upper and lower eyelids: Secondary | ICD-10-CM | POA: Diagnosis not present

## 2018-02-26 DIAGNOSIS — H524 Presbyopia: Secondary | ICD-10-CM | POA: Diagnosis not present

## 2018-02-26 DIAGNOSIS — R7303 Prediabetes: Secondary | ICD-10-CM | POA: Diagnosis not present

## 2018-02-26 DIAGNOSIS — H0100A Unspecified blepharitis right eye, upper and lower eyelids: Secondary | ICD-10-CM | POA: Diagnosis not present

## 2018-03-03 DIAGNOSIS — Z86718 Personal history of other venous thrombosis and embolism: Secondary | ICD-10-CM | POA: Diagnosis not present

## 2018-03-03 DIAGNOSIS — D472 Monoclonal gammopathy: Secondary | ICD-10-CM | POA: Diagnosis not present

## 2018-03-03 DIAGNOSIS — D500 Iron deficiency anemia secondary to blood loss (chronic): Secondary | ICD-10-CM | POA: Diagnosis not present

## 2018-03-09 DIAGNOSIS — D509 Iron deficiency anemia, unspecified: Secondary | ICD-10-CM | POA: Diagnosis not present

## 2018-03-09 DIAGNOSIS — M1612 Unilateral primary osteoarthritis, left hip: Secondary | ICD-10-CM | POA: Diagnosis not present

## 2018-03-09 DIAGNOSIS — M25511 Pain in right shoulder: Secondary | ICD-10-CM | POA: Diagnosis not present

## 2018-03-09 DIAGNOSIS — M87052 Idiopathic aseptic necrosis of left femur: Secondary | ICD-10-CM | POA: Diagnosis not present

## 2018-03-10 DIAGNOSIS — Z1231 Encounter for screening mammogram for malignant neoplasm of breast: Secondary | ICD-10-CM | POA: Diagnosis not present

## 2018-03-13 ENCOUNTER — Ambulatory Visit: Payer: Self-pay | Admitting: Orthopedic Surgery

## 2018-03-19 NOTE — Pre-Procedure Instructions (Signed)
Evelyn Valdez  03/19/2018      [id number] - [place], [place] - [address] [place] [address] Phone: [phone] Fax: [phone]    Your procedure is scheduled on  Monday 03/30/18  Report to [facility] Admitting at 530 A.M.  Call this number if you have problems the morning of surgery:  [phone]   Remember:  Do not eat or drink after midnight.     Take these medicines the morning of surgery with A SIP OF WATER -  ALBUTEROL INHALER (BRING WITH YOU), AMLODIPINE (NORVASC)  7 days prior to surgery STOP taking any Aspirin(unless otherwise instructed by your surgeon), Aleve, Naproxen, Ibuprofen, Motrin, Advil, Goody's, BC's, all herbal medications, fish oil, and all vitamins    Do not wear jewelry, make-up or nail polish.  Do not wear lotions, powders, or perfumes, or deodorant.  Do not shave 48 hours prior to surgery.  Men may shave face and neck.  Do not bring valuables to the hospital.  [facility] is not responsible for any belongings or valuables.  Contacts, dentures or bridgework may not be worn into surgery.  Leave your suitcase in the car.  After surgery it may be brought to your room.  For patients admitted to the hospital, discharge time will be determined by your treatment team.  Patients discharged the day of surgery will not be allowed to drive home.   Name and phone number of your driver:    Special instructions:  [facility] - Preparing for Surgery  Before surgery, you can play an important role.  Because skin is not sterile, your skin needs to be as free of germs as possible.  You can reduce the number of germs on you skin by washing with CHG (chlorahexidine gluconate) soap before surgery.  CHG is an antiseptic cleaner which kills germs and bonds with the skin to continue killing germs even after washing.  Oral Hygiene is also important in reducing the risk of infection.  Remember to brush your teeth with your  regular toothpaste the morning of surgery.  Please DO NOT use if you have an allergy to CHG or antibacterial soaps.  If your skin becomes reddened/irritated stop using the CHG and inform your nurse when you arrive at Short Stay.  Do not shave (including legs and underarms) for at least 48 hours prior to the first CHG shower.  You may shave your face.  Please follow these instructions carefully:   1.  Shower with CHG Soap the night before surgery and the morning of Surgery.  2.  If you choose to wash your hair, wash your hair first as usual with your normal shampoo.  3.  After you shampoo, rinse your hair and body thoroughly to remove the shampoo. 4.  Use CHG as you would any other liquid soap.  You can apply chg directly to the skin and wash gently with a      scrungie or washcloth.           5.  Apply the CHG Soap to your body ONLY FROM THE NECK DOWN.   Do not use on open wounds or open sores. Avoid contact with your eyes, ears, mouth and genitals (private parts).  Wash genitals (private parts) with your normal soap.  6.  Wash thoroughly, paying special attention to the area where your surgery will be performed.  7.  Thoroughly rinse your body with warm water from the  neck down.  8.  DO NOT shower/wash with your normal soap after using and rinsing off the CHG Soap.  9.  Pat yourself dry with a clean towel.            10.  Wear clean pajamas.            11.  Place clean sheets on your bed the night of your first shower and do not sleep with pets.  Day of Surgery  Do not apply any lotions/deoderants the morning of surgery.   Please wear clean clothes to the hospital/surgery center. Remember to brush your teeth with toothpaste.     Please read over the following fact sheets that you were given. MRSA Information and Surgical Site Infection Prevention

## 2018-03-20 ENCOUNTER — Other Ambulatory Visit: Payer: Self-pay

## 2018-03-20 ENCOUNTER — Encounter (HOSPITAL_COMMUNITY)
Admission: RE | Admit: 2018-03-20 | Discharge: 2018-03-20 | Disposition: A | Discharge status: Home / Self Care | Payer: PPO | Source: Ambulatory Visit | Attending: Orthopedic Surgery | Admitting: Orthopedic Surgery

## 2018-03-20 ENCOUNTER — Encounter (HOSPITAL_COMMUNITY): Payer: Self-pay

## 2018-03-20 DIAGNOSIS — Z01812 Encounter for preprocedural laboratory examination: Secondary | ICD-10-CM | POA: Insufficient documentation

## 2018-03-20 HISTORY — DX: Prediabetes: R73.03

## 2018-03-20 LAB — BASIC METABOLIC PANEL
ANION GAP: 9 (ref 5–15)
BUN: 17 mg/dL (ref 8–23)
CO2: 23 mmol/L (ref 22–32)
Calcium: 9.4 mg/dL (ref 8.9–10.3)
Chloride: 108 mmol/L (ref 98–111)
Creatinine, Ser: 0.71 mg/dL (ref 0.44–1.00)
Glucose, Bld: 125 mg/dL — ABNORMAL HIGH (ref 70–99)
POTASSIUM: 5.1 mmol/L (ref 3.5–5.1)
SODIUM: 140 mmol/L (ref 135–145)

## 2018-03-20 LAB — CBC
HCT: 44.2 % (ref 36.0–46.0)
Hemoglobin: 13.4 g/dL (ref 12.0–15.0)
MCH: 27.3 pg (ref 26.0–34.0)
MCHC: 30.3 g/dL (ref 30.0–36.0)
MCV: 90.2 fL (ref 78.0–100.0)
PLATELETS: 219 10*3/uL (ref 150–400)
RBC: 4.9 MIL/uL (ref 3.87–5.11)
RDW: 20.9 % — ABNORMAL HIGH (ref 11.5–15.5)
WBC: 5.8 10*3/uL (ref 4.0–10.5)

## 2018-03-20 LAB — SURGICAL PCR SCREEN
MRSA, PCR: NEGATIVE
STAPHYLOCOCCUS AUREUS: NEGATIVE

## 2018-03-27 MED ORDER — VANCOMYCIN HCL IN DEXTROSE 1-5 GM/200ML-% IV SOLN
1000.0000 mg | INTRAVENOUS | Status: AC
  Administered 2018-03-30: 1000 mg via INTRAVENOUS
  Filled 2018-03-27: qty 200

## 2018-03-27 MED ORDER — ACETAMINOPHEN 10 MG/ML IV SOLN
1000.0000 mg | INTRAVENOUS | Status: AC
  Administered 2018-03-30: 1000 mg via INTRAVENOUS

## 2018-03-27 MED ORDER — CLINDAMYCIN PHOSPHATE 900 MG/50ML IV SOLN
900.0000 mg | INTRAVENOUS | Status: AC
  Administered 2018-03-30: 900 mg via INTRAVENOUS
  Filled 2018-03-27: qty 50

## 2018-03-29 NOTE — Anesthesia Preprocedure Evaluation (Addendum)
Anesthesia Evaluation  Patient identified by MRN, date of birth, ID band Patient awake    Reviewed: Allergy & Precautions, H&P , NPO status , Patient's Chart, lab work & pertinent test results, reviewed documented beta blocker date and time   Airway Mallampati: II  TM Distance: >3 FB Neck ROM: full    Dental no notable dental hx.    Pulmonary asthma ,    Pulmonary exam normal breath sounds clear to auscultation       Cardiovascular hypertension, On Medications  Rhythm:regular Rate:Normal     Neuro/Psych PSYCHIATRIC DISORDERS Anxiety Depression  Neuromuscular disease    GI/Hepatic   Endo/Other  Morbid obesity  Renal/GU      Musculoskeletal  (+) Arthritis , Osteoarthritis,  Fibromyalgia -  Abdominal   Peds  Hematology  (+) anemia ,   Anesthesia Other Findings   Reproductive/Obstetrics                            Anesthesia Physical  Anesthesia Plan  ASA: III  Anesthesia Plan: General   Post-op Pain Management:  Regional for Post-op pain   Induction: Intravenous  PONV Risk Score and Plan: 3 and Ondansetron, Treatment may vary due to age or medical condition, Dexamethasone and Midazolam  Airway Management Planned: LMA and Oral ETT  Additional Equipment:   Intra-op Plan:   Post-operative Plan: Extubation in OR  Informed Consent: I have reviewed the patients History and Physical, chart, labs and discussed the procedure including the risks, benefits and alternatives for the proposed anesthesia with the patient or authorized representative who has indicated his/her understanding and acceptance.   Dental Advisory Given  Plan Discussed with: CRNA, Surgeon and Anesthesiologist  Anesthesia Plan Comments: (  )       Anesthesia Quick Evaluation

## 2018-03-30 ENCOUNTER — Inpatient Hospital Stay (HOSPITAL_COMMUNITY): Payer: PPO

## 2018-03-30 ENCOUNTER — Encounter (HOSPITAL_COMMUNITY)
Admission: RE | Disposition: A | Discharge status: Home / Self Care | Payer: Self-pay | Source: Ambulatory Visit | Attending: Orthopedic Surgery

## 2018-03-30 ENCOUNTER — Encounter (HOSPITAL_COMMUNITY): Payer: Self-pay | Admitting: Certified Registered Nurse Anesthetist

## 2018-03-30 ENCOUNTER — Inpatient Hospital Stay (HOSPITAL_COMMUNITY)
Admission: RE | Admit: 2018-03-30 | Discharge: 2018-03-31 | DRG: 470 | Disposition: A | Discharge status: Home / Self Care | Payer: PPO | Source: Ambulatory Visit | Attending: Orthopedic Surgery | Admitting: Orthopedic Surgery

## 2018-03-30 ENCOUNTER — Inpatient Hospital Stay (HOSPITAL_COMMUNITY): Payer: PPO | Admitting: Certified Registered Nurse Anesthetist

## 2018-03-30 ENCOUNTER — Other Ambulatory Visit: Payer: Self-pay

## 2018-03-30 DIAGNOSIS — R7303 Prediabetes: Secondary | ICD-10-CM | POA: Diagnosis present

## 2018-03-30 DIAGNOSIS — G629 Polyneuropathy, unspecified: Secondary | ICD-10-CM | POA: Diagnosis present

## 2018-03-30 DIAGNOSIS — Z96642 Presence of left artificial hip joint: Secondary | ICD-10-CM | POA: Diagnosis not present

## 2018-03-30 DIAGNOSIS — Z471 Aftercare following joint replacement surgery: Secondary | ICD-10-CM | POA: Diagnosis not present

## 2018-03-30 DIAGNOSIS — Z881 Allergy status to other antibiotic agents status: Secondary | ICD-10-CM | POA: Diagnosis not present

## 2018-03-30 DIAGNOSIS — Z885 Allergy status to narcotic agent status: Secondary | ICD-10-CM | POA: Diagnosis not present

## 2018-03-30 DIAGNOSIS — Z96611 Presence of right artificial shoulder joint: Secondary | ICD-10-CM | POA: Diagnosis not present

## 2018-03-30 DIAGNOSIS — M25552 Pain in left hip: Secondary | ICD-10-CM | POA: Diagnosis present

## 2018-03-30 DIAGNOSIS — M797 Fibromyalgia: Secondary | ICD-10-CM | POA: Diagnosis not present

## 2018-03-30 DIAGNOSIS — Z96641 Presence of right artificial hip joint: Secondary | ICD-10-CM | POA: Diagnosis present

## 2018-03-30 DIAGNOSIS — Z86718 Personal history of other venous thrombosis and embolism: Secondary | ICD-10-CM | POA: Diagnosis not present

## 2018-03-30 DIAGNOSIS — Z882 Allergy status to sulfonamides status: Secondary | ICD-10-CM

## 2018-03-30 DIAGNOSIS — I10 Essential (primary) hypertension: Secondary | ICD-10-CM | POA: Diagnosis not present

## 2018-03-30 DIAGNOSIS — M879 Osteonecrosis, unspecified: Principal | ICD-10-CM | POA: Diagnosis present

## 2018-03-30 DIAGNOSIS — M87052 Idiopathic aseptic necrosis of left femur: Secondary | ICD-10-CM | POA: Diagnosis not present

## 2018-03-30 DIAGNOSIS — M8580 Other specified disorders of bone density and structure, unspecified site: Secondary | ICD-10-CM | POA: Diagnosis not present

## 2018-03-30 DIAGNOSIS — J45909 Unspecified asthma, uncomplicated: Secondary | ICD-10-CM | POA: Diagnosis present

## 2018-03-30 DIAGNOSIS — Z9049 Acquired absence of other specified parts of digestive tract: Secondary | ICD-10-CM

## 2018-03-30 DIAGNOSIS — Z90710 Acquired absence of both cervix and uterus: Secondary | ICD-10-CM | POA: Diagnosis not present

## 2018-03-30 DIAGNOSIS — Z87442 Personal history of urinary calculi: Secondary | ICD-10-CM

## 2018-03-30 DIAGNOSIS — E7800 Pure hypercholesterolemia, unspecified: Secondary | ICD-10-CM | POA: Diagnosis not present

## 2018-03-30 DIAGNOSIS — Z880 Allergy status to penicillin: Secondary | ICD-10-CM

## 2018-03-30 DIAGNOSIS — Z91048 Other nonmedicinal substance allergy status: Secondary | ICD-10-CM | POA: Diagnosis not present

## 2018-03-30 DIAGNOSIS — G4700 Insomnia, unspecified: Secondary | ICD-10-CM | POA: Diagnosis present

## 2018-03-30 DIAGNOSIS — Z886 Allergy status to analgesic agent status: Secondary | ICD-10-CM

## 2018-03-30 DIAGNOSIS — Z96651 Presence of right artificial knee joint: Secondary | ICD-10-CM | POA: Diagnosis not present

## 2018-03-30 DIAGNOSIS — Z419 Encounter for procedure for purposes other than remedying health state, unspecified: Secondary | ICD-10-CM

## 2018-03-30 DIAGNOSIS — E559 Vitamin D deficiency, unspecified: Secondary | ICD-10-CM | POA: Diagnosis present

## 2018-03-30 DIAGNOSIS — Z6839 Body mass index (BMI) 39.0-39.9, adult: Secondary | ICD-10-CM

## 2018-03-30 DIAGNOSIS — Z888 Allergy status to other drugs, medicaments and biological substances status: Secondary | ICD-10-CM

## 2018-03-30 DIAGNOSIS — K5790 Diverticulosis of intestine, part unspecified, without perforation or abscess without bleeding: Secondary | ICD-10-CM | POA: Diagnosis present

## 2018-03-30 DIAGNOSIS — Z09 Encounter for follow-up examination after completed treatment for conditions other than malignant neoplasm: Secondary | ICD-10-CM

## 2018-03-30 HISTORY — PX: TOTAL HIP ARTHROPLASTY: SHX124

## 2018-03-30 LAB — TYPE AND SCREEN
ABO/RH(D): O NEG
Antibody Screen: POSITIVE

## 2018-03-30 SURGERY — ARTHROPLASTY, HIP, TOTAL, ANTERIOR APPROACH
Anesthesia: General | Site: Hip | Laterality: Left

## 2018-03-30 MED ORDER — ONDANSETRON HCL 4 MG PO TABS: 4.0000 mg | ORAL_TABLET | Freq: Four times a day (QID) | ORAL | Status: DC | PRN

## 2018-03-30 MED ORDER — TRAZODONE HCL 50 MG PO TABS
25.0000 mg | ORAL_TABLET | Freq: Every day | ORAL | Status: DC
  Administered 2018-03-30: 25 mg via ORAL
  Filled 2018-03-30: qty 1

## 2018-03-30 MED ORDER — ARTIFICIAL TEARS OPHTHALMIC OINT
TOPICAL_OINTMENT | OPHTHALMIC | Status: AC
  Filled 2018-03-30: qty 3.5

## 2018-03-30 MED ORDER — APIXABAN 2.5 MG PO TABS
2.5000 mg | ORAL_TABLET | Freq: Two times a day (BID) | ORAL | Status: DC
  Administered 2018-03-31: 2.5 mg via ORAL
  Filled 2018-03-30: qty 1

## 2018-03-30 MED ORDER — SODIUM CHLORIDE 0.9 % IV SOLN
INTRAVENOUS | Status: AC | PRN
  Administered 2018-03-30: 500 mL

## 2018-03-30 MED ORDER — POVIDONE-IODINE 10 % EX SWAB: 2.0000 "application " | Freq: Once | CUTANEOUS | Status: DC

## 2018-03-30 MED ORDER — DEXAMETHASONE SODIUM PHOSPHATE 10 MG/ML IJ SOLN
INTRAMUSCULAR | Status: DC | PRN
  Administered 2018-03-30: 10 mg via INTRAVENOUS

## 2018-03-30 MED ORDER — DOCUSATE SODIUM 100 MG PO CAPS
100.0000 mg | ORAL_CAPSULE | Freq: Two times a day (BID) | ORAL | Status: DC
  Administered 2018-03-30 – 2018-03-31 (×3): 100 mg via ORAL
  Filled 2018-03-30 (×3): qty 1

## 2018-03-30 MED ORDER — ALBUTEROL SULFATE (2.5 MG/3ML) 0.083% IN NEBU: 2.5000 mg | INHALATION_SOLUTION | Freq: Four times a day (QID) | RESPIRATORY_TRACT | Status: DC | PRN

## 2018-03-30 MED ORDER — LACTATED RINGERS IV SOLN
INTRAVENOUS | Status: DC | PRN
  Administered 2018-03-30 (×2): via INTRAVENOUS

## 2018-03-30 MED ORDER — ONDANSETRON HCL 4 MG/2ML IJ SOLN
INTRAMUSCULAR | Status: DC | PRN
  Administered 2018-03-30: 4 mg via INTRAVENOUS

## 2018-03-30 MED ORDER — FENTANYL CITRATE (PF) 250 MCG/5ML IJ SOLN
INTRAMUSCULAR | Status: DC | PRN
  Administered 2018-03-30: 50 ug via INTRAVENOUS
  Administered 2018-03-30: 100 ug via INTRAVENOUS
  Administered 2018-03-30 (×2): 50 ug via INTRAVENOUS

## 2018-03-30 MED ORDER — VITAMIN D 1000 UNITS PO TABS
2000.0000 [IU] | ORAL_TABLET | Freq: Every day | ORAL | Status: DC
  Administered 2018-03-30 – 2018-03-31 (×2): 2000 [IU] via ORAL
  Filled 2018-03-30 (×2): qty 2

## 2018-03-30 MED ORDER — DEXAMETHASONE SODIUM PHOSPHATE 10 MG/ML IJ SOLN
INTRAMUSCULAR | Status: AC
  Filled 2018-03-30: qty 1

## 2018-03-30 MED ORDER — 0.9 % SODIUM CHLORIDE (POUR BTL) OPTIME
TOPICAL | Status: DC | PRN
  Administered 2018-03-30: 1000 mL

## 2018-03-30 MED ORDER — SODIUM CHLORIDE 0.9 % IV SOLN: INTRAVENOUS | Status: DC

## 2018-03-30 MED ORDER — BUPIVACAINE-EPINEPHRINE (PF) 0.5% -1:200000 IJ SOLN
INTRAMUSCULAR | Status: AC
  Filled 2018-03-30: qty 30

## 2018-03-30 MED ORDER — SODIUM CHLORIDE 0.9 % IJ SOLN
INTRAMUSCULAR | Status: DC | PRN
  Administered 2018-03-30: 30 mL

## 2018-03-30 MED ORDER — METOCLOPRAMIDE HCL 5 MG PO TABS: 5.0000 mg | ORAL_TABLET | Freq: Three times a day (TID) | ORAL | Status: DC | PRN

## 2018-03-30 MED ORDER — SENNA 8.6 MG PO TABS
1.0000 | ORAL_TABLET | Freq: Two times a day (BID) | ORAL | Status: DC
  Administered 2018-03-30 – 2018-03-31 (×3): 8.6 mg via ORAL
  Filled 2018-03-30 (×3): qty 1

## 2018-03-30 MED ORDER — HYDROMORPHONE HCL 2 MG PO TABS
1.0000 mg | ORAL_TABLET | ORAL | Status: DC | PRN
  Administered 2018-03-30 – 2018-03-31 (×6): 2 mg via ORAL
  Filled 2018-03-30 (×5): qty 1

## 2018-03-30 MED ORDER — SODIUM CHLORIDE 0.9 % IV SOLN
INTRAVENOUS | Status: DC | PRN
  Administered 2018-03-30: 25 ug/min via INTRAVENOUS

## 2018-03-30 MED ORDER — KETOROLAC TROMETHAMINE 30 MG/ML IJ SOLN
INTRAMUSCULAR | Status: AC
  Filled 2018-03-30: qty 1

## 2018-03-30 MED ORDER — BIOTIN 10000 MCG PO TABS: 10000.0000 ug | ORAL_TABLET | Freq: Every day | ORAL | Status: DC

## 2018-03-30 MED ORDER — PROPOFOL 1000 MG/100ML IV EMUL
INTRAVENOUS | Status: AC
  Filled 2018-03-30: qty 100

## 2018-03-30 MED ORDER — CHLORHEXIDINE GLUCONATE 4 % EX LIQD: 60.0000 mL | Freq: Once | CUTANEOUS | Status: DC

## 2018-03-30 MED ORDER — HYDROMORPHONE HCL 2 MG PO TABS
ORAL_TABLET | ORAL | Status: AC
  Filled 2018-03-30: qty 1

## 2018-03-30 MED ORDER — METHOCARBAMOL 500 MG PO TABS
500.0000 mg | ORAL_TABLET | Freq: Four times a day (QID) | ORAL | Status: DC | PRN
  Administered 2018-03-30: 500 mg via ORAL
  Filled 2018-03-30: qty 1

## 2018-03-30 MED ORDER — FUROSEMIDE 20 MG PO TABS: 20.0000 mg | ORAL_TABLET | Freq: Every day | ORAL | Status: DC | PRN

## 2018-03-30 MED ORDER — METOCLOPRAMIDE HCL 5 MG/ML IJ SOLN: 5.0000 mg | Freq: Three times a day (TID) | INTRAMUSCULAR | Status: DC | PRN

## 2018-03-30 MED ORDER — ARTIFICIAL TEARS OPHTHALMIC OINT
TOPICAL_OINTMENT | OPHTHALMIC | Status: DC | PRN
  Administered 2018-03-30: 1 via OPHTHALMIC

## 2018-03-30 MED ORDER — PROPOFOL 500 MG/50ML IV EMUL
INTRAVENOUS | Status: DC | PRN
  Administered 2018-03-30: 25 ug/kg/min via INTRAVENOUS

## 2018-03-30 MED ORDER — DULOXETINE HCL 60 MG PO CPEP
60.0000 mg | ORAL_CAPSULE | Freq: Every evening | ORAL | Status: DC
  Administered 2018-03-30: 60 mg via ORAL
  Filled 2018-03-30: qty 1

## 2018-03-30 MED ORDER — HYDROMORPHONE HCL 1 MG/ML IJ SOLN: 0.5000 mg | INTRAMUSCULAR | Status: DC | PRN

## 2018-03-30 MED ORDER — DIPHENHYDRAMINE HCL 12.5 MG/5ML PO ELIX: 12.5000 mg | ORAL_SOLUTION | ORAL | Status: DC | PRN

## 2018-03-30 MED ORDER — KETOROLAC TROMETHAMINE 30 MG/ML IJ SOLN
INTRAMUSCULAR | Status: DC | PRN
  Administered 2018-03-30: 30 mg via INTRA_ARTICULAR

## 2018-03-30 MED ORDER — GLYCOPYRROLATE PF 0.2 MG/ML IJ SOSY
PREFILLED_SYRINGE | INTRAMUSCULAR | Status: AC
  Filled 2018-03-30: qty 1

## 2018-03-30 MED ORDER — EPHEDRINE SULFATE 50 MG/ML IJ SOLN
INTRAMUSCULAR | Status: AC
  Filled 2018-03-30: qty 1

## 2018-03-30 MED ORDER — ALUM & MAG HYDROXIDE-SIMETH 200-200-20 MG/5ML PO SUSP
30.0000 mL | ORAL | Status: DC | PRN
Start: 2018-03-30 — End: 2018-03-31

## 2018-03-30 MED ORDER — PHENYLEPHRINE HCL 10 MG/ML IJ SOLN
INTRAMUSCULAR | Status: DC | PRN
  Administered 2018-03-30 (×2): 120 ug via INTRAVENOUS

## 2018-03-30 MED ORDER — LIDOCAINE HCL (CARDIAC) PF 100 MG/5ML IV SOSY
PREFILLED_SYRINGE | INTRAVENOUS | Status: DC | PRN
  Administered 2018-03-30: 100 mg via INTRATRACHEAL

## 2018-03-30 MED ORDER — LIDOCAINE 2% (20 MG/ML) 5 ML SYRINGE
INTRAMUSCULAR | Status: AC
  Filled 2018-03-30: qty 5

## 2018-03-30 MED ORDER — ALBUTEROL SULFATE HFA 108 (90 BASE) MCG/ACT IN AERS
INHALATION_SPRAY | RESPIRATORY_TRACT | Status: DC | PRN
  Administered 2018-03-30: 2 via RESPIRATORY_TRACT

## 2018-03-30 MED ORDER — IRBESARTAN 300 MG PO TABS
300.0000 mg | ORAL_TABLET | Freq: Every day | ORAL | Status: DC
  Administered 2018-03-30 – 2018-03-31 (×2): 300 mg via ORAL
  Filled 2018-03-30 (×2): qty 1

## 2018-03-30 MED ORDER — ATORVASTATIN CALCIUM 20 MG PO TABS
20.0000 mg | ORAL_TABLET | Freq: Every evening | ORAL | Status: DC
  Administered 2018-03-30: 20 mg via ORAL
  Filled 2018-03-30 (×2): qty 1

## 2018-03-30 MED ORDER — BUPIVACAINE-EPINEPHRINE (PF) 0.5% -1:200000 IJ SOLN
INTRAMUSCULAR | Status: DC | PRN
  Administered 2018-03-30: 30 mL

## 2018-03-30 MED ORDER — VANCOMYCIN HCL IN DEXTROSE 1-5 GM/200ML-% IV SOLN
1000.0000 mg | Freq: Two times a day (BID) | INTRAVENOUS | Status: AC
  Administered 2018-03-30: 1000 mg via INTRAVENOUS
  Filled 2018-03-30: qty 200

## 2018-03-30 MED ORDER — FENTANYL CITRATE (PF) 100 MCG/2ML IJ SOLN: 25.0000 ug | INTRAMUSCULAR | Status: DC | PRN

## 2018-03-30 MED ORDER — POLYETHYLENE GLYCOL 3350 17 G PO PACK: 17.0000 g | PACK | Freq: Every day | ORAL | Status: DC | PRN

## 2018-03-30 MED ORDER — METANX 3-90.314-2-35 MG PO CAPS: 1.0000 | ORAL_CAPSULE | Freq: Every day | ORAL | Status: DC

## 2018-03-30 MED ORDER — ALBUMIN HUMAN 5 % IV SOLN
INTRAVENOUS | Status: DC | PRN
  Administered 2018-03-30: 09:00:00 via INTRAVENOUS

## 2018-03-30 MED ORDER — METHOCARBAMOL 1000 MG/10ML IJ SOLN: 500.0000 mg | Freq: Four times a day (QID) | INTRAVENOUS | Status: DC | PRN

## 2018-03-30 MED ORDER — MIDAZOLAM HCL 2 MG/2ML IJ SOLN
INTRAMUSCULAR | Status: AC
  Filled 2018-03-30: qty 2

## 2018-03-30 MED ORDER — SODIUM CHLORIDE 0.9 % IV SOLN
1000.0000 mg | INTRAVENOUS | Status: AC
  Administered 2018-03-30: 1000 mg via INTRAVENOUS
  Filled 2018-03-30: qty 1100

## 2018-03-30 MED ORDER — ONDANSETRON HCL 4 MG/2ML IJ SOLN
INTRAMUSCULAR | Status: AC
  Filled 2018-03-30: qty 2

## 2018-03-30 MED ORDER — MENTHOL 3 MG MT LOZG: 1.0000 | LOZENGE | OROMUCOSAL | Status: DC | PRN

## 2018-03-30 MED ORDER — KETAMINE HCL 50 MG/5ML IJ SOSY
PREFILLED_SYRINGE | INTRAMUSCULAR | Status: AC
  Filled 2018-03-30: qty 5

## 2018-03-30 MED ORDER — ONDANSETRON HCL 4 MG/2ML IJ SOLN: 4.0000 mg | Freq: Four times a day (QID) | INTRAMUSCULAR | Status: DC | PRN

## 2018-03-30 MED ORDER — ACETAMINOPHEN 10 MG/ML IV SOLN
INTRAVENOUS | Status: AC
  Filled 2018-03-30: qty 100

## 2018-03-30 MED ORDER — PHENOL 1.4 % MT LIQD: 1.0000 | OROMUCOSAL | Status: DC | PRN

## 2018-03-30 MED ORDER — GLYCOPYRROLATE 0.2 MG/ML IJ SOLN
INTRAMUSCULAR | Status: DC | PRN
  Administered 2018-03-30: 0.2 mg via INTRAVENOUS

## 2018-03-30 MED ORDER — EPHEDRINE SULFATE 50 MG/ML IJ SOLN
INTRAMUSCULAR | Status: DC | PRN
  Administered 2018-03-30: 12.5 mg via INTRAVENOUS

## 2018-03-30 MED ORDER — PROPOFOL 10 MG/ML IV BOLUS
INTRAVENOUS | Status: DC | PRN
  Administered 2018-03-30: 200 mg via INTRAVENOUS

## 2018-03-30 MED ORDER — ACETAMINOPHEN 325 MG PO TABS
325.0000 mg | ORAL_TABLET | Freq: Four times a day (QID) | ORAL | Status: DC | PRN
  Filled 2018-03-30: qty 2

## 2018-03-30 MED ORDER — SODIUM CHLORIDE 0.9 % IR SOLN
Status: DC | PRN
  Administered 2018-03-30: 3000 mL

## 2018-03-30 MED ORDER — AMLODIPINE BESYLATE 5 MG PO TABS: 5.0000 mg | ORAL_TABLET | Freq: Every evening | ORAL | Status: DC

## 2018-03-30 MED ORDER — SODIUM CHLORIDE 0.9 % IV SOLN
INTRAVENOUS | Status: DC
  Administered 2018-03-30: 14:00:00 via INTRAVENOUS

## 2018-03-30 MED ORDER — MIDAZOLAM HCL 2 MG/2ML IJ SOLN
INTRAMUSCULAR | Status: DC | PRN
  Administered 2018-03-30 (×2): 1 mg via INTRAVENOUS

## 2018-03-30 MED ORDER — MEPERIDINE HCL 50 MG/ML IJ SOLN: 6.2500 mg | INTRAMUSCULAR | Status: DC | PRN

## 2018-03-30 MED ORDER — KETAMINE HCL 10 MG/ML IJ SOLN
INTRAMUSCULAR | Status: DC | PRN
  Administered 2018-03-30 (×2): 20 mg via INTRAVENOUS

## 2018-03-30 MED ORDER — GABAPENTIN 300 MG PO CAPS
600.0000 mg | ORAL_CAPSULE | Freq: Every day | ORAL | Status: DC
  Administered 2018-03-30: 600 mg via ORAL
  Filled 2018-03-30: qty 2

## 2018-03-30 MED ORDER — SUCCINYLCHOLINE CHLORIDE 200 MG/10ML IV SOSY
PREFILLED_SYRINGE | INTRAVENOUS | Status: AC
  Filled 2018-03-30: qty 10

## 2018-03-30 MED ORDER — FENTANYL CITRATE (PF) 250 MCG/5ML IJ SOLN
INTRAMUSCULAR | Status: AC
  Filled 2018-03-30: qty 5

## 2018-03-30 SURGICAL SUPPLY — 62 items
ADH SKN CLS APL DERMABOND .7 (GAUZE/BANDAGES/DRESSINGS) ×1
ALCOHOL ISOPROPYL (RUBBING) (MISCELLANEOUS) ×3 IMPLANT
BLADE CLIPPER SURG (BLADE) IMPLANT
CHLORAPREP W/TINT 26ML (MISCELLANEOUS) ×3 IMPLANT
COVER SURGICAL LIGHT HANDLE (MISCELLANEOUS) ×3 IMPLANT
DERMABOND ADVANCED (GAUZE/BANDAGES/DRESSINGS) ×2
DERMABOND ADVANCED .7 DNX12 (GAUZE/BANDAGES/DRESSINGS) ×1 IMPLANT
DRAPE C-ARM 42X72 X-RAY (DRAPES) ×3 IMPLANT
DRAPE STERI IOBAN 125X83 (DRAPES) ×3 IMPLANT
DRAPE U-SHAPE 47X51 STRL (DRAPES) ×9 IMPLANT
DRSG AQUACEL AG ADV 3.5X10 (GAUZE/BANDAGES/DRESSINGS) ×3 IMPLANT
ELECT BLADE 4.0 EZ CLEAN MEGAD (MISCELLANEOUS) ×3
ELECT PENCIL ROCKER SW 15FT (MISCELLANEOUS) ×3 IMPLANT
ELECT REM PT RETURN 9FT ADLT (ELECTROSURGICAL) ×3
ELECTRODE BLDE 4.0 EZ CLN MEGD (MISCELLANEOUS) ×1 IMPLANT
ELECTRODE REM PT RTRN 9FT ADLT (ELECTROSURGICAL) ×1 IMPLANT
EVACUATOR 1/8 PVC DRAIN (DRAIN) IMPLANT
GLOVE BIO SURGEON STRL SZ8.5 (GLOVE) ×6 IMPLANT
GLOVE BIOGEL PI IND STRL 8.5 (GLOVE) ×1 IMPLANT
GLOVE BIOGEL PI INDICATOR 8.5 (GLOVE) ×2
GOWN STRL REUS W/ TWL LRG LVL3 (GOWN DISPOSABLE) ×3 IMPLANT
GOWN STRL REUS W/TWL 2XL LVL3 (GOWN DISPOSABLE) ×3 IMPLANT
GOWN STRL REUS W/TWL LRG LVL3 (GOWN DISPOSABLE) ×9
HANDPIECE INTERPULSE COAX TIP (DISPOSABLE) ×3
HEAD FEM -6XOFST 36XMDLR (Orthopedic Implant) IMPLANT
HEAD MODULAR 36MM (Orthopedic Implant) ×3 IMPLANT
HIP SHELL ACETAB 3H 50MM (Hips) ×3 IMPLANT
HOOD PEEL AWAY FACE SHEILD DIS (HOOD) ×9 IMPLANT
KIT BASIN OR (CUSTOM PROCEDURE TRAY) ×3 IMPLANT
KIT TURNOVER KIT B (KITS) ×3 IMPLANT
LINER ACE 36MM HIP G7 (Liner) ×3 IMPLANT
MANIFOLD NEPTUNE II (INSTRUMENTS) ×3 IMPLANT
MARKER SKIN DUAL TIP RULER LAB (MISCELLANEOUS) ×6 IMPLANT
NDL 18GX1X1/2 (RX/OR ONLY) (NEEDLE) IMPLANT
NEEDLE 18GX1X1/2 (RX/OR ONLY) (NEEDLE) ×3 IMPLANT
NEEDLE SPNL 18GX3.5 QUINCKE PK (NEEDLE) ×3 IMPLANT
NS IRRIG 1000ML POUR BTL (IV SOLUTION) ×3 IMPLANT
PACK TOTAL JOINT (CUSTOM PROCEDURE TRAY) ×3 IMPLANT
PACK UNIVERSAL I (CUSTOM PROCEDURE TRAY) ×3 IMPLANT
PAD ARMBOARD 7.5X6 YLW CONV (MISCELLANEOUS) ×6 IMPLANT
SAW OSC TIP CART 19.5X105X1.3 (SAW) ×3 IMPLANT
SEALER BIPOLAR AQUA 6.0 (INSTRUMENTS) ×2 IMPLANT
SET HNDPC FAN SPRY TIP SCT (DISPOSABLE) ×1 IMPLANT
SHELL ACETAB HIP 3H 50MM (Hips) ×1 IMPLANT
SOL PREP POV-IOD 4OZ 10% (MISCELLANEOUS) IMPLANT
STEM FEM CEMLS SZ 13 133D (Stem) ×3 IMPLANT
SUT ETHIBOND NAB CT1 #1 30IN (SUTURE) ×6 IMPLANT
SUT MNCRL AB 3-0 PS2 18 (SUTURE) ×3 IMPLANT
SUT MON AB 2-0 CT1 36 (SUTURE) ×3 IMPLANT
SUT VIC AB 1 CT1 27 (SUTURE) ×3
SUT VIC AB 1 CT1 27XBRD ANBCTR (SUTURE) ×1 IMPLANT
SUT VIC AB 2-0 CT1 27 (SUTURE) ×3
SUT VIC AB 2-0 CT1 TAPERPNT 27 (SUTURE) ×1 IMPLANT
SUT VLOC 180 0 24IN GS25 (SUTURE) ×6 IMPLANT
SYR 3ML LL SCALE MARK (SYRINGE) ×3 IMPLANT
SYR 50ML LL SCALE MARK (SYRINGE) ×3 IMPLANT
TOWEL OR 17X24 6PK STRL BLUE (TOWEL DISPOSABLE) ×3 IMPLANT
TOWEL OR 17X26 10 PK STRL BLUE (TOWEL DISPOSABLE) ×3 IMPLANT
TRAY CATH 16FR W/PLASTIC CATH (SET/KITS/TRAYS/PACK) IMPLANT
TRAY FOLEY CATH SILVER 16FR (SET/KITS/TRAYS/PACK) IMPLANT
WATER STERILE IRR 1000ML POUR (IV SOLUTION) ×3 IMPLANT
YANKAUER SUCT BULB TIP NO VENT (SUCTIONS) ×6 IMPLANT

## 2018-03-30 NOTE — Transfer of Care (Signed)
Immediate Anesthesia Transfer of Care Note  Patient: Evelyn Valdez  Procedure(s) Performed: LEFT TOTAL HIP ARTHROPLASTY ANTERIOR APPROACH (Left Hip)  Patient Location: PACU  Anesthesia Type:General  Level of Consciousness: awake, alert  and patient cooperative  Airway & Oxygen Therapy: Patient Spontanous Breathing and Patient connected to nasal cannula oxygen  Post-op Assessment: Report given to RN, Post -op Vital signs reviewed and stable, Patient moving all extremities X 4 and Patient able to stick tongue midline  Post vital signs: Reviewed and stable  Last Vitals:  Vitals Value Taken Time  BP 108/49 03/30/2018 10:02 AM  Temp    Pulse 101 03/30/2018 10:07 AM  Resp 11 03/30/2018 10:07 AM  SpO2 100 % 03/30/2018 10:07 AM  Vitals shown include unvalidated device data.  Last Pain:  Vitals:   03/30/18 0706  TempSrc:   PainSc: 5       Patients Stated Pain Goal: 3 (40/10/27 2536)  Complications: No apparent anesthesia complications

## 2018-03-30 NOTE — Evaluation (Signed)
Physical Therapy Evaluation Patient Details Name: Evelyn Valdez MRN: [id number] DOB: [birth year] Today's Date: 03/30/2018   History of Present Illness  Pt is a 77 y/o female s/p elective L THA, direct anterior approach. PMH includes DVT, anxiety, HTN, pre-diabetes, fibromyalgia, R THA, R TSA, and back surgery.   Clinical Impression  Pt is s/p surgery above with deficits below. Pt with increased pain this session, so mobility limited to stand pivot X2 to [facility] and then to chair. REquired min to mod A for mobility tasks using RW. Pt's husband present and very supportive. Educated about supine HEP. Will continue to follow acutely to maximize functional mobility independence and safety.     Follow Up Recommendations Follow surgeon's recommendation for DC plan and follow-up therapies;Supervision for mobility/OOB    Equipment Recommendations  None recommended by PT    Recommendations for Other Services OT consult     Precautions / Restrictions Precautions Precautions: Fall Restrictions Weight Bearing Restrictions: Yes LLE Weight Bearing: Weight bearing as tolerated Other Position/Activity Restrictions: Imaging showed questionable minimal cortical interruption of L pelvis, however, per MD, ok to get up with PT and is WBAT.      Mobility  Bed Mobility Overal bed mobility: Needs Assistance Bed Mobility: Supine to Sit     Supine to sit: Min assist     General bed mobility comments: Min A for RLE assist and trunk elevation to come up to sitting.   Transfers Overall transfer level: Needs assistance Equipment used: Rolling walker (2 wheeled) Transfers: Sit to/from [facility] Sit to Stand: Mod assist Stand pivot transfers: Min assist       General transfer comment: Mod A for lift assist and steadying assist to stand. Verbal cues for safe hand placement. Performed stand pivot X2 to [facility] and then to chair. Pt with episode of incontenence upon standing. Pt also  reported increased pain, so further mobility limited. Required min A for steadying assist throughout transfer. Pt with flexed posture and required cues for upright posture and sequencing using RW throughout transfer.   Ambulation/Gait             General Gait Details: Deferred   Stairs            Wheelchair Mobility    Modified Rankin (Stroke Patients Only)       Balance Overall balance assessment: Needs assistance Sitting-balance support: No upper extremity supported;Feet supported Sitting balance-Leahy Scale: Good     Standing balance support: Bilateral upper extremity supported;During functional activity Standing balance-Leahy Scale: Poor Standing balance comment: Reliant on BUE support.                              Pertinent Vitals/Pain Pain Assessment: Faces Faces Pain Scale: Hurts even more Pain Location: L hip  Pain Descriptors / Indicators: Aching;Operative site guarding Pain Intervention(s): Limited activity within patient's tolerance;Monitored during session;Repositioned    Home Living Family/patient expects to be discharged to:: Private residence Living Arrangements: Spouse/significant other Available Help at Discharge: Family;Available 24 hours/day Type of Home: House Home Access: Ramped entrance;Stairs to enter Entrance Stairs-Rails: Right;Left;Can reach both Entrance Stairs-Number of Steps: 3 Home Layout: One level Home Equipment: Other (comment);Walker - 2 wheels;Walker - 4 wheels;Grab bars - toilet;Grab bars - tub/shower;Bedside commode;Wheelchair - manual(grab bar in hallway; Music therapist)      Prior Function Level of Independence: Needs assistance   Gait / Transfers Assistance Needed: Reports using rollator for ambulation.  ADL's / Homemaking Assistance Needed: Sometimes required assist for ADLs depending on fatigue, however, did report she can do those tasks independently if needed. Reports husband assists with IADLs.          Hand Dominance        Extremity/Trunk Assessment   Upper Extremity Assessment Upper Extremity Assessment: Defer to OT evaluation    Lower Extremity Assessment Lower Extremity Assessment: LLE deficits/detail LLE Deficits / Details: Sensory in tact. Deficits consistent with post op pain and weaknss.     Cervical / Trunk Assessment Cervical / Trunk Assessment: Normal  Communication   Communication: No difficulties  Cognition Arousal/Alertness: Awake/alert Behavior During Therapy: WFL for tasks assessed/performed Overall Cognitive Status: Within Functional Limits for tasks assessed                                        General Comments General comments (skin integrity, edema, etc.): Pt's husband present throughout session.     Exercises Total Joint Exercises Ankle Circles/Pumps: AROM;Both;20 reps   Assessment/Plan    PT Assessment Patient needs continued PT services  PT Problem List Decreased strength;Decreased balance;Decreased mobility;Decreased activity tolerance;Decreased knowledge of use of DME;Pain       PT Treatment Interventions DME instruction;Gait training;Functional mobility training;Stair training;Therapeutic activities;Therapeutic exercise;Balance training;Patient/family education    PT Goals (Current goals can be found in the Care Plan section)  Acute Rehab PT Goals Patient Stated Goal: "to go home tomorrow"  PT Goal Formulation: With patient Time For Goal Achievement: 04/13/18 Potential to Achieve Goals: Good    Frequency 7X/week   Barriers to discharge        Co-evaluation               AM-PAC PT "6 Clicks" Daily Activity  Outcome Measure Difficulty turning over in bed (including adjusting bedclothes, sheets and blankets)?: A Little Difficulty moving from lying on back to sitting on the side of the bed? : Unable Difficulty sitting down on and standing up from a chair with arms (e.g., wheelchair, bedside  commode, etc,.)?: Unable Help needed moving to and from a bed to chair (including a wheelchair)?: A Little Help needed walking in hospital room?: A Lot Help needed climbing 3-5 steps with a railing? : Total 6 Click Score: 11    End of Session Equipment Utilized During Treatment: Gait belt Activity Tolerance: Patient limited by pain Patient left: in chair;with call bell/phone within reach;with family/visitor present Nurse Communication: Mobility status PT Visit Diagnosis: Unsteadiness on feet (R26.81);Other abnormalities of gait and mobility (R26.89);Pain Pain - Right/Left: Left Pain - part of body: Hip    Time: 9024-0973 PT Time Calculation (min) (ACUTE ONLY): 32 min   Charges:   PT Evaluation $PT Eval Low Complexity: 1 Low PT Treatments $Therapeutic Activity: 8-22 mins        Leighton Ruff, PT, DPT  Acute Rehabilitation Services  Pager: [phone]   Rudean Hitt 03/30/2018, 3:30 PM

## 2018-03-30 NOTE — Progress Notes (Signed)
PHARMACIST - PHYSICIAN ORDER COMMUNICATION  CONCERNING: P&T Medication Policy on Herbal Medications  DESCRIPTION:  This patient's order for:  Biotin 10,000 mcg and Metanx capsule  has been noted.  This product(s) is classified as an "herbal" or natural product. Due to a lack of definitive safety studies or FDA approval, nonstandard manufacturing practices, plus the potential risk of unknown drug-drug interactions while on inpatient medications, the Pharmacy and Therapeutics Committee does not permit the use of "herbal" or natural products of this type within [facility].   ACTION TAKEN: The pharmacy department is unable to verify this order at this time and your patient has been informed of this safety policy. Please reevaluate patient's clinical condition at discharge and address if the herbal or natural product(s) should be resumed at that time.   Assunta Curtis, RPh Clinical Pharmacist Phone: [phone] Please check AMION for all [place] numbers

## 2018-03-30 NOTE — Progress Notes (Signed)
RN spoke with Dr. Lyla Glassing and confirmed pt is okay to wbat and work with therapy.

## 2018-03-30 NOTE — H&P (Signed)
TOTAL HIP ADMISSION H&P  Patient is admitted for left total hip arthroplasty.  Subjective:  Chief Complaint: left hip pain  HPI: Evelyn Valdez, 77 y.o. 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  Onset of symptoms was abrupt starting 1 years ago with gradually worsening course since that time.The patient noted no past surgery on the left hip(s).  Patient currently rates pain in the left hip at 10 out of 10 with activity. Patient has night pain, worsening of pain with activity and weight bearing, trendelenberg gait, pain that interfers with activities of daily living and pain with passive range of motion. Patient has evidence of subchondral cysts, subchondral sclerosis and joint space narrowing by imaging studies. This condition presents safety issues increasing the risk of falls. This patient has had avascular necrosis of the hip.  There is no current active infection.  Patient Active Problem List   Diagnosis Date Noted  . Rotator cuff arthropathy of right shoulder 09/25/2017  . S/P reverse total shoulder arthroplasty, right 09/25/2017  . Osteoarthritis of right hip 06/24/2016  . Primary osteoarthritis of right hip 06/24/2016   Past Medical History:  Diagnosis Date  . Anemia   . Anxiety   . Asthma    "well controlled"  . Depression   . Diverticulosis   . Dizziness   . DVT (deep venous thrombosis) ([place])    right lower leg after knee surgery  . Dyspnea    with exertion  . Fibromyalgia   . High cholesterol   . History of anemia   . History of bronchitis   . History of kidney stones   . History of pneumonia   . Hypertension   . Insomnia   . Localized osteoarthritis of right shoulder   .  Obesity   . Osteoarthritis   . Osteopenia   . Peripheral neuropathy    takes Topamax  . Pre-diabetes   . Vitamin D deficiency   . Wears glasses     Past Surgical History:  Procedure Laterality Date  . ABDOMINAL HYSTERECTOMY    . APPENDECTOMY    . BACK SURGERY    . BLADDER REPAIR    . BREAST BIOPSY Right    x2  . BREAST LUMPECTOMY Left   . CHOLECYSTECTOMY    . COLONOSCOPY W/ POLYPECTOMY    . ESOPHAGOGASTRODUODENOSCOPY    . EYE SURGERY Bilateral    cataracts  . FOOT FRACTURE SURGERY Left   . KNEE ARTHROPLASTY Right   . KNEE CARTILAGE SURGERY Right    x3  . MENISCUS REPAIR Left   . PLANTAR FASCIA SURGERY Bilateral   . RECTOCELE REPAIR    . REVERSE SHOULDER ARTHROPLASTY Right 09/25/2017   Procedure: RIGHT REVERSE SHOULDER ARTHROPLASTY;  Surgeon: Nicholes Stairs, MD;  Location: [place];  Service: Orthopedics;  Laterality: Right;  2.5 hrs  . SHOULDER ARTHROSCOPY     left  several surgeries  . SHOULDER SURGERY Right   . TOTAL HIP ARTHROPLASTY Right 06/24/2016  . TOTAL HIP ARTHROPLASTY Right 06/24/2016   Procedure: RIGHT TOTAL HIP ARTHROPLASTY ANTERIOR APPROACH;  Surgeon: Rod Can, MD;  Location: [place];  Service: Orthopedics;  Laterality: Right;  . TUBAL LIGATION      Current Facility-Administered Medications  Medication Dose Route Frequency  Provider Last Rate Last Dose  . 0.9 %  sodium chloride infusion   Intravenous Continuous Ryen Heitmeyer, Aaron Edelman, MD      . acetaminophen (OFIRMEV) IV 1,000 mg  1,000 mg Intravenous To OR Mckala Pantaleon, Aaron Edelman, MD      . chlorhexidine (HIBICLENS) 4 % liquid 4 application  60 mL Topical Once Alem Fahl, Aaron Edelman, MD      . chlorhexidine (HIBICLENS) 4 % liquid 4 application  60 mL Topical Once Tawna Alwin, Aaron Edelman, MD      . clindamycin (CLEOCIN) IVPB 900 mg  900 mg Intravenous To [facility] [place], Aaron Edelman, MD      . povidone-iodine 10 % swab 2 application  2 application Topical Once Lunah Losasso, Aaron Edelman, MD      . tranexamic acid (CYKLOKAPRON) 1,000 mg in sodium  chloride 0.9 % 100 mL IVPB  1,000 mg Intravenous To OR Harumi Yamin, Aaron Edelman, MD       Facility-Administered Medications Ordered in Other Encounters  Medication Dose Route Frequency Provider Last Rate Last Dose  . albuterol (PROVENTIL HFA;VENTOLIN HFA) 108 (90 Base) MCG/ACT inhaler    Anesthesia Intra-op Wall, Nada Maclachlan, CRNA   2 puff at 03/30/18 0714  . glycopyrrolate (ROBINUL) injection    Anesthesia Intra-op Wall, Nada Maclachlan, CRNA   0.2 mg at 03/30/18 0932  . lactated ringers infusion    Continuous PRN Wall, Nada Maclachlan, CRNA      . ondansetron [facility]) injection    Anesthesia Intra-op Wall, Nada Maclachlan, CRNA   4 mg at 03/30/18 6712   Allergies  Allergen Reactions  . Penicillins Swelling, Rash, Other (See Comments) and Hives    PATIENT HAS HAD A PCN REACTION WITH IMMEDIATE RASH, FACIAL/TONGUE/THROAT SWELLING, SOB, OR LIGHTHEADEDNESS WITH HYPOTENSION:  #  #  #  YES  #  #  #   Has patient had a PCN reaction causing severe rash involving mucus membranes or skin necrosis: No PATIENT HAS HAD A PCN REACTION THAT REQUIRED HOSPITALIZATION:  #  #  #  YES  #  #  #  Has patient had a PCN reaction occurring within the last 10 years: No    . Adhesive [Tape] Other (See Comments)    Pulled skin off  . Advair Diskus [Fluticasone-Salmeterol] Other (See Comments)    headaches  . Pregabalin Other (See Comments)    Weight gain  . Nsaids     UNSPECIFIED REACTION   . Baclofen Other (See Comments)    Patient doesn't feel herself on this medication   . Erythromycin Nausea And Vomiting  . Hydrocodone Nausea And Vomiting  . Morphine And Related Nausea And Vomiting  . Nickel Rash  . Percocet [Oxycodone-Acetaminophen] Nausea And Vomiting  . Sulfa Antibiotics Nausea And Vomiting and Rash  . Sulfasalazine Rash    Social History   Tobacco Use  . Smoking status: Never Smoker  . Smokeless tobacco: Never Used  Substance Use Topics  . Alcohol use: No    History reviewed. No pertinent family history.    Review of Systems  Constitutional: Negative.   HENT: Negative.   Eyes: Negative.   Respiratory: Negative.   Cardiovascular: Negative.   Gastrointestinal: Negative.   Genitourinary: Negative.   Musculoskeletal: Positive for joint pain.  Skin: Negative.   Neurological: Negative.   Endo/Heme/Allergies: Negative.   Psychiatric/Behavioral: Negative.     Objective:  Physical Exam  Vitals reviewed. Constitutional: She is oriented to person, place, and time. She appears well-developed and well-nourished.  HENT:  Head: Normocephalic and atraumatic.  Eyes: Pupils are equal, round, and reactive to light. Conjunctivae and EOM are normal.  Neck: Normal range of motion. Neck supple.  Cardiovascular: Normal rate, regular rhythm and intact distal pulses.  Respiratory: Effort normal. No respiratory distress.  GI: Soft. She exhibits no distension.  Genitourinary:  Genitourinary Comments: deferred  Musculoskeletal:       Left hip: She exhibits decreased range of motion and bony tenderness.  Neurological: She is alert and oriented to person, place, and time. She has normal reflexes.  Skin: Skin is warm and dry.  Psychiatric: She has a normal mood and affect. Her behavior is normal. Judgment and thought content normal.    Vital signs in last 24 hours: Temp:  [97.8 F (36.6 C)] 97.8 F (36.6 C) (07/29 0605) Pulse Rate:  [84] 84 (07/29 0605) Resp:  [18] 18 (07/29 0605) BP: (154)/(68) 154/68 (07/29 0605) SpO2:  [98 %] 98 % (07/29 0605) Weight:  [95 kg (209 lb 6.4 oz)] 95 kg (209 lb 6.4 oz) (07/29 0706)  Labs:   Estimated body mass index is 39.57 kg/m as calculated from the following:   Height as of this encounter: 5\' 1"  (1.549 m).   Weight as of this encounter: 95 kg (209 lb 6.4 oz).   Imaging Review Plain radiographs demonstrate severe degenerative joint disease of the left hip(s). The bone quality appears to be adequate for age and reported activity level.    Preoperative  templating of the joint replacement has been completed, documented, and submitted to the Operating Room personnel in order to optimize intra-operative equipment management.     Assessment/Plan:  AVN, left hip(s)  The patient history, physical examination, clinical judgement of the provider and imaging studies are consistent with end stage degenerative joint disease of the left hip(s) and total hip arthroplasty is deemed medically necessary. The treatment options including medical management, injection therapy, arthroscopy and arthroplasty were discussed at length. The risks and benefits of total hip arthroplasty were presented and reviewed. The risks due to aseptic loosening, infection, stiffness, dislocation/subluxation,  thromboembolic complications and other imponderables were discussed.  The patient acknowledged the explanation, agreed to proceed with the plan and consent was signed. Patient is being admitted for inpatient treatment for surgery, pain control, PT, OT, prophylactic antibiotics, VTE prophylaxis, progressive ambulation and ADL's and discharge planning.The patient is planning to be discharged home with HEP. Needs eliquis for h/o DVT.

## 2018-03-30 NOTE — Anesthesia Procedure Notes (Signed)
Procedure Name: Intubation Date/Time: 03/30/2018 8:00 AM Performed by: Janeece Riggers, MD Pre-anesthesia Checklist: Patient identified, Emergency Drugs available, Suction available and Patient being monitored Patient Re-evaluated:Patient Re-evaluated prior to induction Oxygen Delivery Method: Circle system utilized Preoxygenation: Pre-oxygenation with 100% oxygen Induction Type: IV induction Ventilation: Mask ventilation without difficulty Laryngoscope Size: Mac and 3 Grade View: Grade I Tube type: Oral Tube size: 7.0 mm Number of attempts: 1 Airway Equipment and Method: Stylet Placement Confirmation: ETT inserted through vocal cords under direct vision,  positive ETCO2 and breath sounds checked- equal and bilateral Secured at: 21 cm Tube secured with: Tape Dental Injury: Teeth and Oropharynx as per pre-operative assessment

## 2018-03-30 NOTE — Op Note (Signed)
OPERATIVE REPORT  SURGEON: Rod Can, MD   ASSISTANT: April Green, RNFA.  PREOPERATIVE DIAGNOSIS: Left hip avascular necrosis.   POSTOPERATIVE DIAGNOSIS: Left hip avascular necrosis.   PROCEDURE: Left total hip arthroplasty, anterior approach.   IMPLANTS: Biomet Taperloc Complete Microplasty stem, size 13 x 111, std offset. Biomet G7 Cup, size 50 mm. Biomet E1 liner, size 36 mm, 50, +5 neutral. Biomet metal head ball, size 36 - 6 mm.  ANESTHESIA:  General  ESTIMATED BLOOD LOSS:-250 mL    ANTIBIOTICS: 1 g vancomycin.  DRAINS: None.  COMPLICATIONS: None.   CONDITION: PACU - hemodynamically stable.   BRIEF CLINICAL NOTE: Evelyn Valdez is a 77 y.o. female with a long-standing history of Left hip AVN. After failing conservative management, the patient was indicated for total hip arthroplasty. The risks, benefits, and alternatives to the procedure were explained, and the patient elected to proceed.  PROCEDURE IN DETAIL: Surgical site was marked by myself in the pre-op holding area. Once inside the operating room, spinal anesthesia was obtained, and a foley catheter was inserted. The patient was then positioned on the Hana table. All bony prominences were well padded. The hip was prepped and draped in the normal sterile surgical fashion. A time-out was called verifying side and site of surgery. The patient received IV antibiotics within 60 minutes of beginning the procedure.  The direct anterior approach to the hip was performed through the Hueter interval. Lateral femoral circumflex vessels were treated with the Auqumantys. The anterior capsule was exposed and an inverted T capsulotomy was made.The femoral neck cut was made to the level of the templated cut. A corkscrew was placed into the head and the head was removed. The head was passed to the back table and was measured.  Acetabular exposure was achieved, and the pulvinar and labrum were excised. Sequential  reaming of the acetabulum was then performed up to a size 49 mm reamer. A 50 mm cup was then opened and impacted into place at approximately 40 degrees of abduction and 20 degrees of anteversion. The final polyethylene liner was impacted into place and acetabular osteophytes were removed.   I then gained femoral exposure taking care to protect the abductors and greater trochanter. This was performed using standard external rotation, extension, and adduction. The capsule was peeled off the inner aspect of the greater trochanter, taking care to preserve the short external rotators. A cookie cutter was used to enter the femoral canal, and then the femoral canal finder was placed. Sequential broaching was performed up to a size 13. Calcar planer was used on the femoral neck remnant. I placed a std offset neck and a trial head ball. The hip was reduced. Leg lengths and offset were checked fluoroscopically. The hip was dislocated and trial components were removed. The final implants were placed, and the hip was reduced.  Fluoroscopy was used to confirm component position and leg lengths. At 90 degrees of external rotation and full extension, the hip was stable to an anterior directed force.  The wound was copiously irrigated with normal saline using pulse lavage. Marcaine solution was injected into the periarticular soft tissue. The wound was closed in layers using #1 Vicryl and V-Loc for the fascia, 2-0 Vicryl for the subcutaneous fat, 2-0 Monocryl for the deep dermal layer, 3-0 running Monocryl subcuticular stitch, and Dermabond for the skin. Once the glue was fully dried, an Aquacell Ag dressing was applied. The patient was transported to the recovery room in stable condition. Sponge, needle,  and instrument counts were correct at the end of the case x2. The patient tolerated the procedure well and there were no known complications.

## 2018-03-30 NOTE — Anesthesia Postprocedure Evaluation (Signed)
Anesthesia Post Note  Patient: BREANNAH KRATT  Procedure(s) Performed: LEFT TOTAL HIP ARTHROPLASTY ANTERIOR APPROACH (Left Hip)     Patient location during evaluation: PACU Anesthesia Type: General Level of consciousness: awake and alert Pain management: pain level controlled Vital Signs Assessment: post-procedure vital signs reviewed and stable Respiratory status: spontaneous breathing, nonlabored ventilation, respiratory function stable and patient connected to nasal cannula oxygen Cardiovascular status: blood pressure returned to baseline and stable Postop Assessment: no apparent nausea or vomiting Anesthetic complications: no    Last Vitals:  Vitals:   03/30/18 1103 03/30/18 1108  BP: (!) 104/56   Pulse:  91  Resp:  (!) 8  Temp:    SpO2:  100%    Last Pain:  Vitals:   03/30/18 1030  TempSrc:   PainSc: Asleep                 Roemello Speyer

## 2018-03-30 NOTE — Plan of Care (Signed)
  Problem: Activity: Goal: Risk for activity intolerance will decrease Outcome: Progressing   Problem: Elimination: Goal: Will not experience complications related to urinary retention Outcome: Completed/Met   Problem: Pain Managment: Goal: General experience of comfort will improve Outcome: Progressing   

## 2018-03-30 NOTE — Progress Notes (Signed)
Pt c/o 6/10 pain in left hip when awakened, sleeps & appears very comfortable when left alone. Dr Ambrose Pancoast fully updated, came to check pt. He is aware of post op Xray report & will update Dr Lyla Glassing, who is in the OR. OK to tx to 5N.

## 2018-03-31 LAB — BASIC METABOLIC PANEL
ANION GAP: 5 (ref 5–15)
BUN: 13 mg/dL (ref 8–23)
CALCIUM: 8.5 mg/dL — AB (ref 8.9–10.3)
CO2: 25 mmol/L (ref 22–32)
CREATININE: 0.89 mg/dL (ref 0.44–1.00)
Chloride: 106 mmol/L (ref 98–111)
Glucose, Bld: 136 mg/dL — ABNORMAL HIGH (ref 70–99)
Potassium: 4.1 mmol/L (ref 3.5–5.1)
SODIUM: 136 mmol/L (ref 135–145)

## 2018-03-31 LAB — CBC
HEMATOCRIT: 31 % — AB (ref 36.0–46.0)
HEMOGLOBIN: 9.4 g/dL — AB (ref 12.0–15.0)
MCH: 28.1 pg (ref 26.0–34.0)
MCHC: 30.3 g/dL (ref 30.0–36.0)
MCV: 92.5 fL (ref 78.0–100.0)
Platelets: 153 10*3/uL (ref 150–400)
RBC: 3.35 MIL/uL — AB (ref 3.87–5.11)
RDW: 20.1 % — ABNORMAL HIGH (ref 11.5–15.5)
WBC: 15.3 10*3/uL — AB (ref 4.0–10.5)

## 2018-03-31 MED ORDER — APIXABAN 2.5 MG PO TABS: 2.5000 mg | ORAL_TABLET | Freq: Two times a day (BID) | ORAL | 0 refills | Status: DC

## 2018-03-31 MED ORDER — ONDANSETRON HCL 4 MG PO TABS: 4.0000 mg | ORAL_TABLET | Freq: Four times a day (QID) | ORAL | 0 refills | Status: DC | PRN

## 2018-03-31 MED ORDER — DOCUSATE SODIUM 100 MG PO CAPS: 100.0000 mg | ORAL_CAPSULE | Freq: Two times a day (BID) | ORAL | 1 refills | Status: DC

## 2018-03-31 MED ORDER — HYDROMORPHONE HCL 2 MG PO TABS: 1.0000 mg | ORAL_TABLET | ORAL | 0 refills | Status: DC | PRN

## 2018-03-31 MED ORDER — SENNA 8.6 MG PO TABS: 1.0000 | ORAL_TABLET | Freq: Two times a day (BID) | ORAL | 0 refills | Status: DC

## 2018-03-31 NOTE — Discharge Instructions (Signed)
 Dr. Brian Swinteck Joint Replacement Specialist [facility] [address] [place], [place] [address] [phone]   TOTAL HIP REPLACEMENT POSTOPERATIVE DIRECTIONS    Hip Rehabilitation, Guidelines Following Surgery   WEIGHT BEARING Weight bearing as tolerated with assist device (walker, cane, etc) as directed, use it as long as suggested by your surgeon or therapist, typically at least 4-6 weeks.  The results of a hip operation are greatly improved after range of motion and muscle strengthening exercises. Follow all safety measures which are given to protect your hip. If any of these exercises cause increased pain or swelling in your joint, decrease the amount until you are comfortable again. Then slowly increase the exercises. Call your caregiver if you have problems or questions.   HOME CARE INSTRUCTIONS  Most of the following instructions are designed to prevent the dislocation of your new hip.  Remove items at home which could result in a fall. This includes throw rugs or furniture in walking pathways.  Continue medications as instructed at time of discharge.  You may have some home medications which will be placed on hold until you complete the course of blood thinner medication.  You may start showering once you are discharged home. Do not remove your dressing. Do not put on socks or shoes without following the instructions of your caregivers.   Sit on chairs with arms. Use the chair arms to help push yourself up when arising.  Arrange for the use of a toilet seat elevator so you are not sitting low.   Walk with walker as instructed.  You may resume a sexual relationship in one month or when given the OK by your caregiver.  Use walker as long as suggested by your caregivers.  You may put full weight on your legs and walk as much as is comfortable. Avoid periods of inactivity such as sitting longer than an hour when not asleep. This helps prevent  blood clots.  You may return to work once you are cleared by your surgeon.  Do not drive a car for 6 weeks or until released by your surgeon.  Do not drive while taking narcotics.  Wear elastic stockings for two weeks following surgery during the day but you may remove then at night.  Make sure you keep all of your appointments after your operation with all of your doctors and caregivers. You should call the office at the above phone number and make an appointment for approximately two weeks after the date of your surgery. Please pick up a stool softener and laxative for home use as long as you are requiring pain medications.  ICE to the affected hip every three hours for 30 minutes at a time and then as needed for pain and swelling. Continue to use ice on the hip for pain and swelling from surgery. You may notice swelling that will progress down to the foot and ankle.  This is normal after surgery.  Elevate the leg when you are not up walking on it.   It is important for you to complete the blood thinner medication as prescribed by your doctor.  Continue to use the breathing machine which will help keep your temperature down.  It is common for your temperature to cycle up and down following surgery, especially at night when you are not up moving around and exerting yourself.  The breathing machine keeps your lungs expanded and your temperature down.  RANGE OF MOTION AND STRENGTHENING EXERCISES  These exercises   are designed to help you keep full movement of your hip joint. Follow your caregiver's or physical therapist's instructions. Perform all exercises about fifteen times, three times per day or as directed. Exercise both hips, even if you have had only one joint replacement. These exercises can be done on a training (exercise) mat, on the floor, on a table or on a bed. Use whatever works the best and is most comfortable for you. Use music or television while you are exercising so that the exercises  are a pleasant break in your day. This will make your life better with the exercises acting as a break in routine you can look forward to.  Lying on your back, slowly slide your foot toward your buttocks, raising your knee up off the floor. Then slowly slide your foot back down until your leg is straight again.  Lying on your back spread your legs as far apart as you can without causing discomfort.  Lying on your side, raise your upper leg and foot straight up from the floor as far as is comfortable. Slowly lower the leg and repeat.  Lying on your back, tighten up the muscle in the front of your thigh (quadriceps muscles). You can do this by keeping your leg straight and trying to raise your heel off the floor. This helps strengthen the largest muscle supporting your knee.  Lying on your back, tighten up the muscles of your buttocks both with the legs straight and with the knee bent at a comfortable angle while keeping your heel on the floor.   SKILLED REHAB INSTRUCTIONS: If the patient is transferred to a skilled rehab facility following release from the hospital, a list of the current medications will be sent to the facility for the patient to continue.  When discharged from the skilled rehab facility, please have the facility set up the patient's Home Health Physical Therapy prior to being released. Also, the skilled facility will be responsible for providing the patient with their medications at time of release from the facility to include their pain medication and their blood thinner medication. If the patient is still at the rehab facility at time of the two week follow up appointment, the skilled rehab facility will also need to assist the patient in arranging follow up appointment in our office and any transportation needs.  MAKE SURE YOU:  Understand these instructions.  Will watch your condition.  Will get help right away if you are not doing well or get worse.  Pick up stool softner and  laxative for home use following surgery while on pain medications. Do not remove your dressing. The dressing is waterproof--it is OK to take showers. Continue to use ice for pain and swelling after surgery. Do not use any lotions or creams on the incision until instructed by your surgeon. Total Hip Protocol.    Information on my medicine - ELIQUIS (apixaban)  This medication education was reviewed with me or my healthcare representative as part of my discharge preparation.    Why was Eliquis prescribed for you? Eliquis was prescribed for you to reduce the risk of blood clots forming after orthopedic surgery.    What do You need to know about Eliquis? Take your Eliquis TWICE DAILY - one tablet in the morning and one tablet in the evening with or without food.  It would be best to take the dose about the same time each day.  If you have difficulty swallowing the tablet whole please discuss with   your pharmacist how to take the medication safely.  Take Eliquis exactly as prescribed by your doctor and DO NOT stop taking Eliquis without talking to the doctor who prescribed the medication.  Stopping without other medication to take the place of Eliquis may increase your risk of developing a clot.  After discharge, you should have regular check-up appointments with your healthcare provider that is prescribing your Eliquis.  What do you do if you miss a dose? If a dose of ELIQUIS is not taken at the scheduled time, take it as soon as possible on the same day and twice-daily administration should be resumed.  The dose should not be doubled to make up for a missed dose.  Do not take more than one tablet of ELIQUIS at the same time.  Important Safety Information A possible side effect of Eliquis is bleeding. You should call your healthcare provider right away if you experience any of the following: ? Bleeding from an injury or your nose that does not stop. ? Unusual colored urine (red  or dark brown) or unusual colored stools (red or black). ? Unusual bruising for unknown reasons. ? A serious fall or if you hit your head (even if there is no bleeding).  Some medicines may interact with Eliquis and might increase your risk of bleeding or clotting while on Eliquis. To help avoid this, consult your healthcare provider or pharmacist prior to using any new prescription or non-prescription medications, including herbals, vitamins, non-steroidal anti-inflammatory drugs (NSAIDs) and supplements.  This website has more information on Eliquis (apixaban): http://www.eliquis.com/eliquis/home  

## 2018-03-31 NOTE — Evaluation (Signed)
Occupational Therapy Evaluation Patient Details Name: Evelyn Valdez MRN: [id number] DOB: 1940-11-12 Today's Date: 03/31/2018    History of Present Illness Pt is a 77 y/o female s/p elective L THA, direct anterior approach. PMH includes DVT, anxiety, HTN, pre-diabetes, fibromyalgia, R THA, R TSA, and back surgery.    Clinical Impression   PTA, pt required intermittent assistance for LB ADL and bathing tasks. She currently requires min guard assist with RW for toilet transfers, mod assist for tub/shower transfers with husband demonstrating safe ability to assist, and min guard assist for toileting hygiene. Pt and her husband educated concerning safe tub/shower transfers and compensatory strategies for LB ADL. Pt would benefit from continued OT services while admitted to improve independence and safety with ADL and functional mobility. Do not anticipate need for OT follow-up post-acute D/C. OT will continue to follow while admitted.     Follow Up Recommendations  No OT follow up;Supervision/Assistance - 24 hour    Equipment Recommendations  None recommended by OT(has needs met)    Recommendations for Other Services       Precautions / Restrictions Precautions Precautions: Fall Restrictions Weight Bearing Restrictions: Yes LLE Weight Bearing: Weight bearing as tolerated Other Position/Activity Restrictions: Imaging showed questionable minimal cortical interruption of L pelvis, however, per MD, ok to get up with PT and is WBAT.      Mobility Bed Mobility Overal bed mobility: Needs Assistance Bed Mobility: Sit to Supine     Supine to sit: Min assist Sit to supine: Mod assist   General bed mobility comments: Assist for RLE back into bed.   Transfers Overall transfer level: Needs assistance Equipment used: Rolling walker (2 wheeled) Transfers: Sit to/from Stand Sit to Stand: Min guard         General transfer comment: Min guard assist to power up to standing with cues  for hand placement.     Balance Overall balance assessment: Needs assistance Sitting-balance support: No upper extremity supported;Feet supported Sitting balance-Leahy Scale: Good     Standing balance support: Bilateral upper extremity supported;During functional activity Standing balance-Leahy Scale: Poor Standing balance comment: Relies on BUE support                           ADL either performed or assessed with clinical judgement   ADL Overall ADL's : Needs assistance/impaired Eating/Feeding: Set up;Sitting   Grooming: Set up;Sitting   Upper Body Bathing: Supervision/ safety;Sitting   Lower Body Bathing: Minimal assistance;Sit to/from stand   Upper Body Dressing : Supervision/safety;Sitting   Lower Body Dressing: Minimal assistance;Sit to/from stand   Toilet Transfer: Min guard;Ambulation;RW   Toileting- Clothing Manipulation and Hygiene: Minimal assistance;Sit to/from stand   Tub/ Banker: Moderate assistance;Ambulation;Shower seat;Rolling walker   Functional mobility during ADLs: Min guard;Rolling walker General ADL Comments: Pt and husband educated concerning compensatory strategies for LB ADL participation as well as safe tub/shower transfers. She required multiple attempts to master tub/shower transfer. Her husband is able to provide the necessary level of assistance.      Vision Patient Visual Report: No change from baseline Vision Assessment?: No apparent visual deficits     Perception     Praxis      Pertinent Vitals/Pain Pain Assessment: Faces Faces Pain Scale: Hurts even more Pain Location: L hip  Pain Descriptors / Indicators: Aching;Operative site guarding Pain Intervention(s): Limited activity within patient's tolerance;Monitored during session;Repositioned     Hand Dominance Right   Extremity/Trunk Assessment  Upper Extremity Assessment Upper Extremity Assessment: RUE deficits/detail;LUE deficits/detail RUE Deficits /  Details: History of RTSA. Limited shoulder AROM ~0-120.  LUE Deficits / Details: History of rotator cuff repair.    Lower Extremity Assessment Lower Extremity Assessment: LLE deficits/detail LLE Deficits / Details: decreased strength and ROM as expected post-operatively.    Cervical / Trunk Assessment Cervical / Trunk Assessment: Normal   Communication Communication Communication: No difficulties   Cognition Arousal/Alertness: Awake/alert Behavior During Therapy: WFL for tasks assessed/performed Overall Cognitive Status: Within Functional Limits for tasks assessed                                     General Comments  Pt's husband present and engaged in session.     Exercises Total Joint Exercises Ankle Circles/Pumps: AROM;Both;10 reps;Supine Quad Sets: AROM;Left;10 reps;Supine Short Arc Quad: AROM;Left;10 reps;Supine Heel Slides: Left;AAROM;10 reps;Supine Hip ABduction/ADduction: AAROM;Left;10 reps;Supine Long Arc Quad: AROM;Left;10 reps;Seated Knee Flexion: AROM;Left;10 reps;Standing Marching in Standing: AROM;Left;10 reps;Standing Standing Hip Extension: AROM;Left;Standing;10 reps General Exercises - Lower Extremity Hip ABduction/ADduction: AROM;Left;10 reps;Standing   Shoulder Instructions      Home Living Family/patient expects to be discharged to:: Private residence Living Arrangements: Spouse/significant other Available Help at Discharge: Family;Available 24 hours/day Type of Home: House Home Access: Ramped entrance;Stairs to enter Entrance Stairs-Number of Steps: 3 Entrance Stairs-Rails: Right;Left;Can reach both Home Layout: One level     Bathroom Shower/Tub: Teacher, early years/pre: Handicapped height     Home Equipment: Other (comment);Walker - 2 wheels;Walker - 4 wheels;Grab bars - toilet;Grab bars - tub/shower;Bedside commode;Wheelchair - manual(grab bar in shower; electric, adjustable shower seat)          Prior  Functioning/Environment Level of Independence: Needs assistance  Gait / Transfers Assistance Needed: Reports using rollator for ambulation.  ADL's / Homemaking Assistance Needed: Occasional assist with LB dressing tasks, bathing tasks due to fatigue/pain. Husband does all cooking and assists with other IADL.            OT Problem List: Decreased strength;Decreased range of motion;Decreased activity tolerance;Impaired balance (sitting and/or standing);Decreased safety awareness;Decreased knowledge of use of DME or AE;Decreased knowledge of precautions;Pain;Impaired UE functional use      OT Treatment/Interventions:      OT Goals(Current goals can be found in the care plan section) Acute Rehab OT Goals Patient Stated Goal: "to go home tomorrow"  OT Goal Formulation: With patient Time For Goal Achievement: 04/14/18 Potential to Achieve Goals: Good ADL Goals Pt Will Perform Grooming: with supervision;standing Pt Will Perform Lower Body Dressing: with supervision;sit to/from stand;with adaptive equipment(with or without AE) Pt Will Transfer to Toilet: with modified independence;ambulating(comfort height toilet seat) Pt Will Perform Toileting - Clothing Manipulation and hygiene: sit to/from stand;with modified independence Pt Will Perform Tub/Shower Transfer: Tub transfer;rolling walker;shower seat;ambulating  OT Frequency:     Barriers to D/C:            Co-evaluation              AM-PAC PT "6 Clicks" Daily Activity     Outcome Measure Help from another person eating meals?: None Help from another person taking care of personal grooming?: None(seated) Help from another person toileting, which includes using toliet, bedpan, or urinal?: A Little Help from another person bathing (including washing, rinsing, drying)?: A Lot Help from another person to put on and taking off regular upper body clothing?: A Little  Help from another person to put on and taking off regular lower body  clothing?: A Little 6 Click Score: 19   End of Session Equipment Utilized During Treatment: Gait belt;Rolling walker Nurse Communication: Mobility status  Activity Tolerance: Patient tolerated treatment well Patient left: in bed;with call bell/phone within reach  OT Visit Diagnosis: Other abnormalities of gait and mobility (R26.89);Pain Pain - Right/Left: Left Pain - part of body: Hip                Time: 1111-1150 OT Time Calculation (min): 39 min Charges:  OT General Charges $OT Visit: 1 Visit OT Evaluation $OT Eval Moderate Complexity: 1 Mod OT Treatments $Self Care/Home Management : 23-37 mins  Norman Herrlich, MS OTR/L  Pager: [place] A Evelyn Valdez 03/31/2018, 1:38 PM

## 2018-03-31 NOTE — Care Management Note (Signed)
Case Management Note  Patient Details  Name: REONNA FINLAYSON MRN: [id number] Date of Birth: May 04, 1941  Subjective/Objective:  77 yr old female s/p left total hip, anterior approach.                   Action/Plan: Case manager spoke with patient and her husband concerning discharge plan. Patient will discharge home and follow Home Exercise Program. She will have the support of her very attentive husband of 57 yrs.    Expected Discharge Date:   03/31/18               Expected Discharge Plan:  Home/Self Care  In-House Referral:  NA  Discharge planning Services  CM Consult  Post Acute Care Choice:  Durable Medical Equipment Choice offered to:  NA  DME Arranged:  [facility] DME Agency:  [place] Arranged:  NA HH Agency:  NA  Status of Service:  Completed, signed off  If discussed at [place] of Stay Meetings, dates discussed:    Additional Comments:  Ninfa Meeker, RN 03/31/2018, 12:08 PM

## 2018-03-31 NOTE — Progress Notes (Signed)
    Subjective:  Patient reports pain as mild to moderate.  Denies N/V/CP/SOB. No c/o.  Objective:   VITALS:   Vitals:   03/30/18 1215 03/30/18 1227 03/30/18 1932 03/31/18 0028  BP:  (!) 116/58 124/71 120/68  Pulse:  92 93 80  Resp:  18 16 15   Temp: (!) 97.2 F (36.2 C) (!) 97.5 F (36.4 C) 98.6 F (37 C) 98.4 F (36.9 C)  TempSrc:  Oral Oral Oral  SpO2:   94% 90%  Weight:      Height:        NAD ABD soft Sensation intact distally Intact pulses distally Dorsiflexion/Plantar flexion intact Incision: dressing C/D/I Compartment soft   Lab Results  Component Value Date   WBC 15.3 (H) 03/31/2018   HGB 9.4 (L) 03/31/2018   HCT 31.0 (L) 03/31/2018   MCV 92.5 03/31/2018   PLT 153 03/31/2018   BMET    Component Value Date/Time   NA 136 03/31/2018 0458   K 4.1 03/31/2018 0458   CL 106 03/31/2018 0458   CO2 25 03/31/2018 0458   GLUCOSE 136 (H) 03/31/2018 0458   BUN 13 03/31/2018 0458   CREATININE 0.89 03/31/2018 0458   CALCIUM 8.5 (L) 03/31/2018 0458   GFRNONAA >60 03/31/2018 0458   GFRAA >60 03/31/2018 0458    Patient's anticipated LOS is less than 2 midnights, meeting these requirements: - Younger than 34 - Lives within 1 hour of care - Has a competent adult at home to recover with post-op recover - NO history of  - Chronic pain requiring opiods  - Diabetes  - Coronary Artery Disease  - Heart failure  - Heart attack  - Stroke  - DVT/VTE  - Cardiac arrhythmia  - Respiratory Failure/COPD  - Renal failure  - Anemia  - Advanced Liver disease        Assessment/Plan: 1 Day Post-Op   Principal Problem:   Avascular necrosis of hip, left ([place])   WBAT with walker DVT ppx: apixaban, SCDs, TEDS PO pain control PT/OT Dispo: D/C home with HEP    Evelyn Valdez 03/31/2018, 12:37 PM   Rod Can, MD Cell [phone]

## 2018-03-31 NOTE — Progress Notes (Signed)
Physical Therapy Treatment Patient Details Name: Evelyn Valdez MRN: [id number] DOB: 10/18/1940 Today's Date: 03/31/2018    History of Present Illness Pt is a 77 y/o female s/p elective L THA, direct anterior approach. PMH includes DVT, anxiety, HTN, pre-diabetes, fibromyalgia, R THA, R TSA, and back surgery.     PT Comments    Pt performed gait, stair training and review of HEP.  Pt slow and guarded but demonstrates good tolerance to activity.  Plan for session in pm if patient remains hospitalized otherwise she is ready to d/c from a mobility stand point.  Informed nurse of need for youth RW at d/c to fit patient's short stature.      Follow Up Recommendations  Follow surgeon's recommendation for DC plan and follow-up therapies;Supervision for mobility/OOB     Equipment Recommendations  Rolling walker with 5" wheels(YOUTH HEIGHT)    Recommendations for Other Services       Precautions / Restrictions Precautions Precautions: Fall Restrictions Weight Bearing Restrictions: Yes LLE Weight Bearing: Weight bearing as tolerated Other Position/Activity Restrictions: Imaging showed questionable minimal cortical interruption of L pelvis, however, per MD, ok to get up with PT and is WBAT.    Mobility  Bed Mobility Overal bed mobility: Needs Assistance Bed Mobility: Supine to Sit     Supine to sit: Min assist     General bed mobility comments: Min A for RLE assist and trunk elevation to come up to sitting.   Transfers Overall transfer level: Needs assistance Equipment used: Rolling walker (2 wheeled) Transfers: Sit to/from Stand Sit to Stand: Min guard         General transfer comment: Cues for hand placement to push from seated surface to achieve standing.  Pt performed additional transfer from toilet with cues for hand placement to push from arm rests of BSC.    Ambulation/Gait Ambulation/Gait assistance: Min guard Gait Distance (Feet): 125 Feet Assistive device:  Rolling walker (2 wheeled) Gait Pattern/deviations: Step-to pattern;Step-through pattern;Shuffle;Antalgic;Decreased stride length     General Gait Details: Pt required youth RW to reduce fatigue in B shoulders during gait training.  Pt slow and guarded but tolerated gait well.     Stairs Stairs: Yes Stairs assistance: Min guard Stair Management: Two rails;Step to pattern;Forwards Number of Stairs: 4 General stair comments: Cues for sequencing and hand placement.  Pt performed with cues for safety.     Wheelchair Mobility    Modified Rankin (Stroke Patients Only)       Balance Overall balance assessment: Needs assistance Sitting-balance support: No upper extremity supported;Feet supported Sitting balance-Leahy Scale: Good       Standing balance-Leahy Scale: Poor                              Cognition Arousal/Alertness: Awake/alert Behavior During Therapy: WFL for tasks assessed/performed Overall Cognitive Status: Within Functional Limits for tasks assessed                                        Exercises Total Joint Exercises Ankle Circles/Pumps: AROM;Both;10 reps;Supine Quad Sets: AROM;Left;10 reps;Supine Short Arc Quad: AROM;Left;10 reps;Supine Heel Slides: Left;AAROM;10 reps;Supine Hip ABduction/ADduction: AAROM;Left;10 reps;Supine Long Arc Quad: AROM;Left;10 reps;Seated Knee Flexion: AROM;Left;10 reps;Standing Marching in Standing: AROM;Left;10 reps;Standing Standing Hip Extension: AROM;Left;Standing;10 reps General Exercises - Lower Extremity Hip ABduction/ADduction: AROM;Left;10 reps;Standing    General Comments  Pertinent Vitals/Pain Pain Assessment: Faces Faces Pain Scale: Hurts even more Pain Location: L hip  Pain Descriptors / Indicators: Aching;Operative site guarding Pain Intervention(s): Monitored during session;Repositioned;Ice applied    Home Living                      Prior Function             PT Goals (current goals can now be found in the care plan section) Acute Rehab PT Goals Patient Stated Goal: "to go home tomorrow"  Potential to Achieve Goals: Good Progress towards PT goals: Progressing toward goals    Frequency    7X/week      PT Plan Current plan remains appropriate    Co-evaluation              AM-PAC PT "6 Clicks" Daily Activity  Outcome Measure  Difficulty turning over in bed (including adjusting bedclothes, sheets and blankets)?: A Little Difficulty moving from lying on back to sitting on the side of the bed? : Unable Difficulty sitting down on and standing up from a chair with arms (e.g., wheelchair, bedside commode, etc,.)?: Unable Help needed moving to and from a bed to chair (including a wheelchair)?: A Little Help needed walking in hospital room?: A Lot Help needed climbing 3-5 steps with a railing? : Total 6 Click Score: 11    End of Session Equipment Utilized During Treatment: Gait belt Activity Tolerance: Patient limited by pain Patient left: in chair;with call bell/phone within reach;with family/visitor present Nurse Communication: Mobility status PT Visit Diagnosis: Unsteadiness on feet (R26.81);Other abnormalities of gait and mobility (R26.89);Pain Pain - Right/Left: Left Pain - part of body: Hip     Time: 1029-1105 PT Time Calculation (min) (ACUTE ONLY): 36 min  Charges:  $Gait Training: 8-22 mins $Therapeutic Exercise: 8-22 mins                     Governor Rooks, PTA pager [phone]    Cristela Blue 03/31/2018, 11:30 AM

## 2018-03-31 NOTE — Discharge Summary (Signed)
Physician Discharge Summary  Patient ID: Evelyn Valdez MRN: [id number] DOB/AGE: April 13, 1941 77 y.o.  Admit date: 03/30/2018 Discharge date: 03/31/2018  Admission Diagnoses:  Avascular necrosis of hip, left [facility])  Discharge Diagnoses:  Principal Problem:   Avascular necrosis of hip, left [facility])   Past Medical History:  Diagnosis Date  . Anemia   . Anxiety   . Asthma    "well controlled"  . Depression   . Diverticulosis   . Dizziness   . DVT (deep venous thrombosis) ([place])    right lower leg after knee surgery  . Dyspnea    with exertion  . Fibromyalgia   . High cholesterol   . History of anemia   . History of bronchitis   . History of kidney stones   . History of pneumonia   . Hypertension   . Insomnia   . Localized osteoarthritis of right shoulder   . Obesity   . Osteoarthritis   . Osteopenia   . Peripheral neuropathy    takes Topamax  . Pre-diabetes   . Vitamin D deficiency   . Wears glasses     Surgeries: Procedure(s): LEFT TOTAL HIP ARTHROPLASTY ANTERIOR APPROACH on 03/30/2018   Consultants (if any):   Discharged Condition: Improved  Hospital Course: Evelyn Valdez is an 77 y.o. female who was admitted 03/30/2018 with a diagnosis of Avascular necrosis of hip, left ([place]) and went to the operating room on 03/30/2018 and underwent the above named procedures.    She was given perioperative antibiotics:  Anti-infectives (From admission, onward)   Start     Dose/Rate Route Frequency Ordered Stop   03/30/18 1900  vancomycin (VANCOCIN) IVPB 1000 mg/200 mL premix     1,000 mg 200 mL/hr over 60 Minutes Intravenous Every 12 hours 03/30/18 1225 03/30/18 1948   03/30/18 0630  clindamycin (CLEOCIN) IVPB 900 mg     900 mg 100 mL/hr over 30 Minutes Intravenous To ShortStay Surgical 03/27/18 1128 03/30/18 0812   03/30/18 0600  vancomycin (VANCOCIN) IVPB 1000 mg/200 mL premix     1,000 mg 200 mL/hr over 60 Minutes Intravenous To ShortStay Surgical 03/27/18 1130  03/30/18 0710    .  She was given sequential compression devices, early ambulation, and apixaban for DVT prophylaxis.  She benefited maximally from the hospital stay and there were no complications.    Recent vital signs:  Vitals:   03/30/18 1932 03/31/18 0028  BP: 124/71 120/68  Pulse: 93 80  Resp: 16 15  Temp: 98.6 F (37 C) 98.4 F (36.9 C)  SpO2: 94% 90%    Recent laboratory studies:  Lab Results  Component Value Date   HGB 9.4 (L) 03/31/2018   HGB 13.4 03/20/2018   HGB 9.4 (L) 09/26/2017   Lab Results  Component Value Date   WBC 15.3 (H) 03/31/2018   PLT 153 03/31/2018   No results found for: INR Lab Results  Component Value Date   NA 136 03/31/2018   K 4.1 03/31/2018   CL 106 03/31/2018   CO2 25 03/31/2018   BUN 13 03/31/2018   CREATININE 0.89 03/31/2018   GLUCOSE 136 (H) 03/31/2018    Discharge Medications:   Allergies as of 03/31/2018      Reactions   Penicillins Swelling, Rash, Other (See Comments), Hives   PATIENT HAS HAD A PCN REACTION WITH IMMEDIATE RASH, FACIAL/TONGUE/THROAT SWELLING, SOB, OR LIGHTHEADEDNESS WITH HYPOTENSION:  #  #  #  YES  #  #  #  Has patient had a PCN reaction causing severe rash involving mucus membranes or skin necrosis: No PATIENT HAS HAD A PCN REACTION THAT REQUIRED HOSPITALIZATION:  #  #  #  YES  #  #  #  Has patient had a PCN reaction occurring within the last 10 years: No   Adhesive [tape] Other (See Comments)   Pulled skin off   Advair Diskus [fluticasone-salmeterol] Other (See Comments)   headaches   Pregabalin Other (See Comments)   Weight gain   Nsaids    UNSPECIFIED REACTION    Baclofen Other (See Comments)   Patient doesn't feel herself on this medication    Erythromycin Nausea And Vomiting   Hydrocodone Nausea And Vomiting   Morphine And Related Nausea And Vomiting   Nickel Rash   Percocet [oxycodone-acetaminophen] Nausea And Vomiting   Sulfa Antibiotics Nausea And Vomiting, Rash   Sulfasalazine Rash       Medication List    STOP taking these medications   acetaminophen 650 MG CR tablet Commonly known as:  TYLENOL   rivaroxaban 10 MG Tabs tablet Commonly known as:  XARELTO     TAKE these medications   albuterol 108 (90 Base) MCG/ACT inhaler Commonly known as:  PROVENTIL HFA;VENTOLIN HFA Inhale 1-2 puffs into the lungs every 6 (six) hours as needed for wheezing or shortness of breath.   amLODipine 5 MG tablet Commonly known as:  NORVASC Take 5 mg by mouth every evening.   apixaban 2.5 MG Tabs tablet Commonly known as:  ELIQUIS Take 1 tablet (2.5 mg total) by mouth every 12 (twelve) hours.   APPLE CIDER VINEGAR PO Take 450 mg by mouth 3 (three) times daily with meals.   atorvastatin 20 MG tablet Commonly known as:  LIPITOR Take 20 mg by mouth every evening.   Biotin 10000 MCG Tabs Take 10,000 mcg by mouth daily.   docusate sodium 100 MG capsule Commonly known as:  COLACE Take 1 capsule (100 mg total) by mouth 2 (two) times daily.   DULoxetine 60 MG capsule Commonly known as:  CYMBALTA Take 60 mg by mouth every evening.   furosemide 20 MG tablet Commonly known as:  LASIX Take 20 mg by mouth daily as needed (for swelling/edema).   gabapentin 300 MG capsule Commonly known as:  NEURONTIN Take 600 mg by mouth at bedtime.   HYDROmorphone 2 MG tablet Commonly known as:  DILAUDID Take 0.5-1 tablets (1-2 mg total) by mouth every 4 (four) hours as needed for moderate pain. What changed:    how much to take  reasons to take this   ibuprofen 200 MG tablet Commonly known as:  ADVIL,MOTRIN Take 400 mg by mouth every 8 (eight) hours as needed (for pain.).   irbesartan 300 MG tablet Commonly known as:  AVAPRO Take 300 mg by mouth daily.   METANX 3-90.314-2-35 MG Caps Take 1 capsule by mouth daily.   ondansetron 4 MG tablet Commonly known as:  ZOFRAN Take 1 tablet (4 mg total) by mouth every 6 (six) hours as needed for nausea.   senna 8.6 MG Tabs  tablet Commonly known as:  SENOKOT Take 1 tablet (8.6 mg total) by mouth 2 (two) times daily.   traZODone 50 MG tablet Commonly known as:  DESYREL Take 25 mg by mouth at bedtime.   Vitamin D3 2000 units Tabs Take 2,000 Units by mouth daily.            Durable Medical Equipment  (From admission, onward)  Start     Ordered   03/31/18 1154  For home use only DME Walker rolling  Once    Comments:  Needs youth walker  Question:  Patient needs a walker to treat with the following condition  Answer:  S/P total hip arthroplasty   03/31/18 1153      Diagnostic Studies: Dg Pelvis Portable  Result Date: 03/30/2018 CLINICAL DATA:  77 year old female post total left hip arthroplasty. Subsequent encounter. EXAM: PORTABLE PELVIS 1-2 VIEWS COMPARISON:  03/30/2018 intraoperative C-arm views. FINDINGS: Post recent total left hip replacement. Question minimal cortical interruption of the left pelvis adjacent to acetabular component. Femoral component unremarkable on single frontal projection. Post remote right hip replacement. Vascular calcifications. IMPRESSION: Post recent total left hip replacement. Question minimal cortical interruption of the left pelvis adjacent to acetabular component. This can be further assessed with cone-down views/oblique views if clinically desired. These results will be called to the ordering clinician or representative by the Radiologist Assistant, and communication documented in the PACS or zVision Dashboard. Electronically Signed   By: Genia Del M.D.   On: 03/30/2018 10:56   Dg C-arm 1-60 Min  Result Date: 03/30/2018 CLINICAL DATA:  77 year old female post left hip replacement. Initial encounter. EXAM: DG C-ARM 61-120 MIN; OPERATIVE LEFT HIP WITH PELVIS Fluoroscopic time: 21 seconds. COMPARISON:  None. FINDINGS: Four intraoperative C-arm views of the left hip submitted for review after surgery. Post total left hip replacement which appears in satisfactory  position without complication noted. Acetabular aspect with position just below left pelvic cortical margin. Remote right hip replacement. IMPRESSION: Post total left hip replacement. Electronically Signed   By: Genia Del M.D.   On: 03/30/2018 10:05   Dg C-arm 1-60 Min  Result Date: 03/30/2018 CLINICAL DATA:  77 year old female post left hip replacement. Initial encounter. EXAM: DG C-ARM 61-120 MIN; OPERATIVE LEFT HIP WITH PELVIS Fluoroscopic time: 21 seconds. COMPARISON:  None. FINDINGS: Four intraoperative C-arm views of the left hip submitted for review after surgery. Post total left hip replacement which appears in satisfactory position without complication noted. Acetabular aspect with position just below left pelvic cortical margin. Remote right hip replacement. IMPRESSION: Post total left hip replacement. Electronically Signed   By: Genia Del M.D.   On: 03/30/2018 10:05   Dg Hip Operative Unilat W Or W/o Pelvis Left  Result Date: 03/30/2018 CLINICAL DATA:  77 year old female post left hip replacement. Initial encounter. EXAM: DG C-ARM 61-120 MIN; OPERATIVE LEFT HIP WITH PELVIS Fluoroscopic time: 21 seconds. COMPARISON:  None. FINDINGS: Four intraoperative C-arm views of the left hip submitted for review after surgery. Post total left hip replacement which appears in satisfactory position without complication noted. Acetabular aspect with position just below left pelvic cortical margin. Remote right hip replacement. IMPRESSION: Post total left hip replacement. Electronically Signed   By: Genia Del M.D.   On: 03/30/2018 10:05    Disposition: Discharge disposition: 01-Home or Self Care       Discharge Instructions    Call MD / Call 911   Complete by:  As directed    If you experience chest pain or shortness of breath, CALL 911 and be transported to the hospital emergency room.  If you develope a fever above 101 F, pus (white drainage) or increased drainage or redness at the  wound, or calf pain, call your surgeon's office.   Constipation Prevention   Complete by:  As directed    Drink plenty of fluids.  Prune juice may be  helpful.  You may use a stool softener, such as Colace (over the counter) 100 mg twice a day.  Use MiraLax (over the counter) for constipation as needed.   Diet - low sodium heart healthy   Complete by:  As directed    Driving restrictions   Complete by:  As directed    No driving for 6 weeks   Increase activity slowly as tolerated   Complete by:  As directed    Lifting restrictions   Complete by:  As directed    No lifting for 6 weeks   TED hose   Complete by:  As directed    Use stockings (TED hose) for 2 weeks on both leg(s).  You may remove them at night for sleeping.      Follow-up Information    Jourdan Maldonado, Aaron Edelman, MD. Schedule an appointment as soon as possible for a visit in 2 weeks.   Specialty:  Orthopedic Surgery Why:  For wound re-check Contact information: [address] [place] [address] [phone]            Signed: Hilton Cork Princella Jaskiewicz 03/31/2018, 12:41 PM

## 2018-03-31 NOTE — Progress Notes (Signed)
Pt given prescriptions and discharge instructions. Instructions gone over with her and husband present. Answered all questions to satisfaction. All belongings gathered to be sent home.

## 2018-03-31 NOTE — Plan of Care (Signed)
  Problem: Education: Goal: Understanding of discharge needs will improve Outcome: Adequate for Discharge   Problem: Activity: Goal: Ability to avoid complications of mobility impairment will improve Outcome: Adequate for Discharge Goal: Ability to tolerate increased activity will improve Outcome: Adequate for Discharge   Problem: Pain Management: Goal: Pain level will decrease with appropriate interventions Outcome: Progressing

## 2018-04-01 ENCOUNTER — Encounter (HOSPITAL_COMMUNITY): Payer: Self-pay | Admitting: Orthopedic Surgery

## 2018-04-01 LAB — TYPE AND SCREEN
ABO/RH(D): O NEG
Antibody Screen: POSITIVE
DONOR AG TYPE: NEGATIVE
DONOR AG TYPE: NEGATIVE
UNIT DIVISION: 0
UNIT DIVISION: 0

## 2018-04-01 LAB — BPAM RBC
BLOOD PRODUCT EXPIRATION DATE: 201908102359
Blood Product Expiration Date: 201908102359
ISSUE DATE / TIME: 201907101744
ISSUE DATE / TIME: 201907101744
UNIT TYPE AND RH: 9500
Unit Type and Rh: 9500

## 2018-04-07 DIAGNOSIS — Z471 Aftercare following joint replacement surgery: Secondary | ICD-10-CM | POA: Diagnosis not present

## 2018-04-07 DIAGNOSIS — Z96642 Presence of left artificial hip joint: Secondary | ICD-10-CM | POA: Diagnosis not present

## 2018-04-24 DIAGNOSIS — Z471 Aftercare following joint replacement surgery: Secondary | ICD-10-CM | POA: Diagnosis not present

## 2018-04-24 DIAGNOSIS — Z96642 Presence of left artificial hip joint: Secondary | ICD-10-CM | POA: Diagnosis not present

## 2018-04-28 DIAGNOSIS — Z96642 Presence of left artificial hip joint: Secondary | ICD-10-CM | POA: Diagnosis not present

## 2018-04-28 DIAGNOSIS — Z471 Aftercare following joint replacement surgery: Secondary | ICD-10-CM | POA: Diagnosis not present

## 2018-04-28 DIAGNOSIS — M25562 Pain in left knee: Secondary | ICD-10-CM | POA: Diagnosis not present

## 2018-05-01 DIAGNOSIS — F330 Major depressive disorder, recurrent, mild: Secondary | ICD-10-CM | POA: Diagnosis not present

## 2018-05-01 DIAGNOSIS — G8929 Other chronic pain: Secondary | ICD-10-CM | POA: Diagnosis not present

## 2018-05-01 DIAGNOSIS — R7303 Prediabetes: Secondary | ICD-10-CM | POA: Diagnosis not present

## 2018-05-01 DIAGNOSIS — D500 Iron deficiency anemia secondary to blood loss (chronic): Secondary | ICD-10-CM | POA: Diagnosis not present

## 2018-05-01 DIAGNOSIS — M25511 Pain in right shoulder: Secondary | ICD-10-CM | POA: Diagnosis not present

## 2018-05-01 DIAGNOSIS — E782 Mixed hyperlipidemia: Secondary | ICD-10-CM | POA: Diagnosis not present

## 2018-05-01 DIAGNOSIS — I10 Essential (primary) hypertension: Secondary | ICD-10-CM | POA: Diagnosis not present

## 2018-05-01 DIAGNOSIS — F5101 Primary insomnia: Secondary | ICD-10-CM | POA: Diagnosis not present

## 2018-05-05 DIAGNOSIS — Z96642 Presence of left artificial hip joint: Secondary | ICD-10-CM | POA: Diagnosis not present

## 2018-05-12 ENCOUNTER — Other Ambulatory Visit: Payer: Self-pay

## 2018-05-12 ENCOUNTER — Encounter (HOSPITAL_COMMUNITY): Payer: Self-pay | Admitting: *Deleted

## 2018-05-12 ENCOUNTER — Ambulatory Visit: Payer: Self-pay | Admitting: Orthopedic Surgery

## 2018-05-12 DIAGNOSIS — M1612 Unilateral primary osteoarthritis, left hip: Secondary | ICD-10-CM | POA: Diagnosis not present

## 2018-05-12 DIAGNOSIS — M19011 Primary osteoarthritis, right shoulder: Secondary | ICD-10-CM | POA: Diagnosis not present

## 2018-05-12 DIAGNOSIS — Z96642 Presence of left artificial hip joint: Secondary | ICD-10-CM | POA: Diagnosis not present

## 2018-05-12 DIAGNOSIS — Z471 Aftercare following joint replacement surgery: Secondary | ICD-10-CM | POA: Diagnosis not present

## 2018-05-12 NOTE — Progress Notes (Signed)
Need orders in epic. Surgery on 05/14/2018.  Thank You

## 2018-05-14 ENCOUNTER — Encounter (HOSPITAL_COMMUNITY): Payer: Self-pay | Admitting: *Deleted

## 2018-05-14 ENCOUNTER — Ambulatory Visit (HOSPITAL_COMMUNITY)
Admission: RE | Admit: 2018-05-14 | Discharge: 2018-05-14 | Disposition: A | Discharge status: Home / Self Care | Payer: PPO | Source: Ambulatory Visit | Attending: Orthopedic Surgery | Admitting: Orthopedic Surgery

## 2018-05-14 ENCOUNTER — Ambulatory Visit (HOSPITAL_COMMUNITY): Payer: PPO | Admitting: Certified Registered"

## 2018-05-14 ENCOUNTER — Encounter (HOSPITAL_COMMUNITY)
Admission: RE | Disposition: A | Discharge status: Home / Self Care | Payer: Self-pay | Source: Ambulatory Visit | Attending: Orthopedic Surgery

## 2018-05-14 DIAGNOSIS — F419 Anxiety disorder, unspecified: Secondary | ICD-10-CM | POA: Diagnosis not present

## 2018-05-14 DIAGNOSIS — E559 Vitamin D deficiency, unspecified: Secondary | ICD-10-CM | POA: Diagnosis not present

## 2018-05-14 DIAGNOSIS — E7800 Pure hypercholesterolemia, unspecified: Secondary | ICD-10-CM | POA: Diagnosis not present

## 2018-05-14 DIAGNOSIS — Z86718 Personal history of other venous thrombosis and embolism: Secondary | ICD-10-CM | POA: Insufficient documentation

## 2018-05-14 DIAGNOSIS — G629 Polyneuropathy, unspecified: Secondary | ICD-10-CM | POA: Diagnosis not present

## 2018-05-14 DIAGNOSIS — F418 Other specified anxiety disorders: Secondary | ICD-10-CM | POA: Diagnosis not present

## 2018-05-14 DIAGNOSIS — J45909 Unspecified asthma, uncomplicated: Secondary | ICD-10-CM | POA: Diagnosis not present

## 2018-05-14 DIAGNOSIS — G4700 Insomnia, unspecified: Secondary | ICD-10-CM | POA: Diagnosis not present

## 2018-05-14 DIAGNOSIS — Y831 Surgical operation with implant of artificial internal device as the cause of abnormal reaction of the patient, or of later complication, without mention of misadventure at the time of the procedure: Secondary | ICD-10-CM | POA: Diagnosis not present

## 2018-05-14 DIAGNOSIS — Z6839 Body mass index (BMI) 39.0-39.9, adult: Secondary | ICD-10-CM | POA: Insufficient documentation

## 2018-05-14 DIAGNOSIS — Z96641 Presence of right artificial hip joint: Secondary | ICD-10-CM | POA: Insufficient documentation

## 2018-05-14 DIAGNOSIS — F329 Major depressive disorder, single episode, unspecified: Secondary | ICD-10-CM | POA: Diagnosis not present

## 2018-05-14 DIAGNOSIS — T8131XA Disruption of external operation (surgical) wound, not elsewhere classified, initial encounter: Secondary | ICD-10-CM | POA: Diagnosis not present

## 2018-05-14 DIAGNOSIS — D649 Anemia, unspecified: Secondary | ICD-10-CM | POA: Insufficient documentation

## 2018-05-14 DIAGNOSIS — I10 Essential (primary) hypertension: Secondary | ICD-10-CM | POA: Insufficient documentation

## 2018-05-14 DIAGNOSIS — T8132XA Disruption of internal operation (surgical) wound, not elsewhere classified, initial encounter: Secondary | ICD-10-CM | POA: Diagnosis not present

## 2018-05-14 DIAGNOSIS — S71002A Unspecified open wound, left hip, initial encounter: Secondary | ICD-10-CM | POA: Diagnosis not present

## 2018-05-14 DIAGNOSIS — Z79899 Other long term (current) drug therapy: Secondary | ICD-10-CM | POA: Diagnosis not present

## 2018-05-14 DIAGNOSIS — M797 Fibromyalgia: Secondary | ICD-10-CM | POA: Diagnosis not present

## 2018-05-14 HISTORY — PX: INCISION AND DRAINAGE HIP: SHX1801

## 2018-05-14 HISTORY — DX: Pneumonia, unspecified organism: J18.9

## 2018-05-14 HISTORY — DX: Other specified postprocedural states: R11.2

## 2018-05-14 HISTORY — DX: Other specified postprocedural states: Z98.890

## 2018-05-14 LAB — CBC
HCT: 40.4 % (ref 36.0–46.0)
Hemoglobin: 12.6 g/dL (ref 12.0–15.0)
MCH: 28.5 pg (ref 26.0–34.0)
MCHC: 31.2 g/dL (ref 30.0–36.0)
MCV: 91.4 fL (ref 78.0–100.0)
PLATELETS: 284 10*3/uL (ref 150–400)
RBC: 4.42 MIL/uL (ref 3.87–5.11)
RDW: 16 % — ABNORMAL HIGH (ref 11.5–15.5)
WBC: 8 10*3/uL (ref 4.0–10.5)

## 2018-05-14 LAB — GLUCOSE, CAPILLARY: Glucose-Capillary: 100 mg/dL — ABNORMAL HIGH (ref 70–99)

## 2018-05-14 LAB — BASIC METABOLIC PANEL
Anion gap: 11 (ref 5–15)
BUN: 15 mg/dL (ref 8–23)
CHLORIDE: 104 mmol/L (ref 98–111)
CO2: 27 mmol/L (ref 22–32)
Calcium: 9.2 mg/dL (ref 8.9–10.3)
Creatinine, Ser: 0.69 mg/dL (ref 0.44–1.00)
GFR calc non Af Amer: >60 mL/min (ref >60)
Glucose, Bld: 108 mg/dL — ABNORMAL HIGH (ref 70–99)
POTASSIUM: 4.4 mmol/L (ref 3.5–5.1)
Sodium: 142 mmol/L (ref 135–145)

## 2018-05-14 SURGERY — IRRIGATION AND DEBRIDEMENT HIP
Anesthesia: General | Site: Hip | Laterality: Left

## 2018-05-14 MED ORDER — HYDROMORPHONE HCL 1 MG/ML IJ SOLN: 0.5000 mg | INTRAMUSCULAR | Status: DC | PRN

## 2018-05-14 MED ORDER — DIPHENHYDRAMINE HCL 50 MG/ML IJ SOLN
25.0000 mg | Freq: Once | INTRAMUSCULAR | Status: AC
  Administered 2018-05-14: 25 mg via INTRAVENOUS

## 2018-05-14 MED ORDER — MEPERIDINE HCL 50 MG/ML IJ SOLN: 6.2500 mg | INTRAMUSCULAR | Status: DC | PRN

## 2018-05-14 MED ORDER — FENTANYL CITRATE (PF) 250 MCG/5ML IJ SOLN
INTRAMUSCULAR | Status: DC | PRN
  Administered 2018-05-14 (×5): 50 ug via INTRAVENOUS

## 2018-05-14 MED ORDER — LIDOCAINE 2% (20 MG/ML) 5 ML SYRINGE
INTRAMUSCULAR | Status: AC
  Filled 2018-05-14: qty 10

## 2018-05-14 MED ORDER — MIDAZOLAM HCL 2 MG/2ML IJ SOLN
INTRAMUSCULAR | Status: AC
  Filled 2018-05-14: qty 2

## 2018-05-14 MED ORDER — LACTATED RINGERS IV SOLN
INTRAVENOUS | Status: DC
  Administered 2018-05-14 (×3): via INTRAVENOUS

## 2018-05-14 MED ORDER — SODIUM CHLORIDE 0.9 % IR SOLN
Status: DC | PRN
  Administered 2018-05-14: 3000 mL

## 2018-05-14 MED ORDER — METOCLOPRAMIDE HCL 5 MG/ML IJ SOLN: 5.0000 mg | Freq: Three times a day (TID) | INTRAMUSCULAR | Status: DC | PRN

## 2018-05-14 MED ORDER — FENTANYL CITRATE (PF) 100 MCG/2ML IJ SOLN
INTRAMUSCULAR | Status: AC
  Filled 2018-05-14: qty 2

## 2018-05-14 MED ORDER — LIDOCAINE 2% (20 MG/ML) 5 ML SYRINGE
INTRAMUSCULAR | Status: DC | PRN
  Administered 2018-05-14: 100 mg via INTRAVENOUS

## 2018-05-14 MED ORDER — PROPOFOL 10 MG/ML IV BOLUS
INTRAVENOUS | Status: DC | PRN
  Administered 2018-05-14: 100 mg via INTRAVENOUS

## 2018-05-14 MED ORDER — ONDANSETRON HCL 4 MG/2ML IJ SOLN
INTRAMUSCULAR | Status: AC
  Filled 2018-05-14: qty 2

## 2018-05-14 MED ORDER — 0.9 % SODIUM CHLORIDE (POUR BTL) OPTIME
TOPICAL | Status: DC | PRN
  Administered 2018-05-14: 1000 mL

## 2018-05-14 MED ORDER — ONDANSETRON HCL 4 MG/2ML IJ SOLN: 4.0000 mg | Freq: Four times a day (QID) | INTRAMUSCULAR | Status: DC | PRN

## 2018-05-14 MED ORDER — CHLORHEXIDINE GLUCONATE 4 % EX LIQD: 60.0000 mL | Freq: Once | CUTANEOUS | Status: DC

## 2018-05-14 MED ORDER — FENTANYL CITRATE (PF) 100 MCG/2ML IJ SOLN
25.0000 ug | INTRAMUSCULAR | Status: DC | PRN
  Administered 2018-05-14 (×2): 25 ug via INTRAVENOUS

## 2018-05-14 MED ORDER — PHENYLEPHRINE 40 MCG/ML (10ML) SYRINGE FOR IV PUSH (FOR BLOOD PRESSURE SUPPORT)
PREFILLED_SYRINGE | INTRAVENOUS | Status: AC
  Filled 2018-05-14: qty 20

## 2018-05-14 MED ORDER — METOCLOPRAMIDE HCL 5 MG PO TABS
5.0000 mg | ORAL_TABLET | Freq: Three times a day (TID) | ORAL | Status: DC | PRN
  Filled 2018-05-14: qty 2

## 2018-05-14 MED ORDER — DEXAMETHASONE SODIUM PHOSPHATE 10 MG/ML IJ SOLN
INTRAMUSCULAR | Status: AC
  Filled 2018-05-14: qty 1

## 2018-05-14 MED ORDER — DIPHENHYDRAMINE HCL 50 MG/ML IJ SOLN
INTRAMUSCULAR | Status: AC
  Filled 2018-05-14: qty 1

## 2018-05-14 MED ORDER — VANCOMYCIN HCL IN DEXTROSE 1-5 GM/200ML-% IV SOLN
1000.0000 mg | INTRAVENOUS | Status: AC
  Administered 2018-05-14: 1000 mg via INTRAVENOUS
  Filled 2018-05-14: qty 200

## 2018-05-14 MED ORDER — SUCCINYLCHOLINE CHLORIDE 200 MG/10ML IV SOSY
PREFILLED_SYRINGE | INTRAVENOUS | Status: AC
  Filled 2018-05-14: qty 10

## 2018-05-14 MED ORDER — PHENYLEPHRINE 40 MCG/ML (10ML) SYRINGE FOR IV PUSH (FOR BLOOD PRESSURE SUPPORT)
PREFILLED_SYRINGE | INTRAVENOUS | Status: DC | PRN
  Administered 2018-05-14 (×2): 80 ug via INTRAVENOUS
  Administered 2018-05-14: 120 ug via INTRAVENOUS

## 2018-05-14 MED ORDER — EPHEDRINE 5 MG/ML INJ
INTRAVENOUS | Status: AC
  Filled 2018-05-14: qty 10

## 2018-05-14 MED ORDER — PROPOFOL 10 MG/ML IV BOLUS
INTRAVENOUS | Status: AC
  Filled 2018-05-14: qty 20

## 2018-05-14 MED ORDER — METOCLOPRAMIDE HCL 5 MG/ML IJ SOLN: 10.0000 mg | Freq: Once | INTRAMUSCULAR | Status: DC | PRN

## 2018-05-14 MED ORDER — ONDANSETRON HCL 4 MG PO TABS
4.0000 mg | ORAL_TABLET | Freq: Four times a day (QID) | ORAL | Status: DC | PRN
  Filled 2018-05-14: qty 1

## 2018-05-14 MED ORDER — FENTANYL CITRATE (PF) 250 MCG/5ML IJ SOLN
INTRAMUSCULAR | Status: AC
  Filled 2018-05-14: qty 5

## 2018-05-14 MED ORDER — ONDANSETRON HCL 4 MG/2ML IJ SOLN
INTRAMUSCULAR | Status: DC | PRN
  Administered 2018-05-14: 4 mg via INTRAVENOUS

## 2018-05-14 MED ORDER — DEXAMETHASONE SODIUM PHOSPHATE 10 MG/ML IJ SOLN
INTRAMUSCULAR | Status: DC | PRN
  Administered 2018-05-14: 5 mg via INTRAVENOUS

## 2018-05-14 SURGICAL SUPPLY — 44 items
ADH SKN CLS APL DERMABOND .7 (GAUZE/BANDAGES/DRESSINGS)
BAG SPEC THK2 15X12 ZIP CLS (MISCELLANEOUS) ×1
BAG ZIPLOCK 12X15 (MISCELLANEOUS) ×3 IMPLANT
COVER SURGICAL LIGHT HANDLE (MISCELLANEOUS) ×3 IMPLANT
DERMABOND ADVANCED (GAUZE/BANDAGES/DRESSINGS)
DERMABOND ADVANCED .7 DNX12 (GAUZE/BANDAGES/DRESSINGS) IMPLANT
DRAPE ORTHO SPLIT 77X108 STRL (DRAPES) ×6
DRAPE SURG 17X11 SM STRL (DRAPES) ×3 IMPLANT
DRAPE SURG ORHT 6 SPLT 77X108 (DRAPES) ×2 IMPLANT
DRAPE U-SHAPE 47X51 STRL (DRAPES) ×3 IMPLANT
DRSG AQUACEL AG ADV 3.5X10 (GAUZE/BANDAGES/DRESSINGS) IMPLANT
DURAPREP 26ML APPLICATOR (WOUND CARE) IMPLANT
ELECT REM PT RETURN 15FT ADLT (MISCELLANEOUS) ×3 IMPLANT
EVACUATOR 1/8 PVC DRAIN (DRAIN) ×3 IMPLANT
GAUZE SPONGE 2X2 8PLY STRL LF (GAUZE/BANDAGES/DRESSINGS) ×1 IMPLANT
GLOVE BIOGEL PI IND STRL 7.5 (GLOVE) ×1 IMPLANT
GLOVE BIOGEL PI IND STRL 8.5 (GLOVE) ×1 IMPLANT
GLOVE BIOGEL PI INDICATOR 7.5 (GLOVE) ×2
GLOVE BIOGEL PI INDICATOR 8.5 (GLOVE) ×2
GLOVE ECLIPSE 8.0 STRL XLNG CF (GLOVE) IMPLANT
GLOVE ORTHO TXT STRL SZ7.5 (GLOVE) ×6 IMPLANT
GLOVE SURG ORTHO 8.0 STRL STRW (GLOVE) ×3 IMPLANT
GOWN SPEC L3 XXLG W/TWL (GOWN DISPOSABLE) ×6 IMPLANT
GOWN STRL REUS W/TWL LRG LVL3 (GOWN DISPOSABLE) ×3 IMPLANT
HANDPIECE INTERPULSE COAX TIP (DISPOSABLE) ×3
IV NS IRRIG 3000ML ARTHROMATIC (IV SOLUTION) ×3 IMPLANT
KIT BASIN OR (CUSTOM PROCEDURE TRAY) ×3 IMPLANT
KIT DRSG PREVENA PLUS 7DAY 125 (MISCELLANEOUS) ×2 IMPLANT
KIT PREVENA INCISION MGT 13 (CANNISTER) ×3 IMPLANT
MANIFOLD NEPTUNE II (INSTRUMENTS) ×3 IMPLANT
PACK ANTERIOR HIP CUSTOM (KITS) ×3 IMPLANT
POSITIONER SURGICAL ARM (MISCELLANEOUS) ×3 IMPLANT
SET HNDPC FAN SPRY TIP SCT (DISPOSABLE) ×1 IMPLANT
SPONGE GAUZE 2X2 STER 10/PKG (GAUZE/BANDAGES/DRESSINGS) ×2
STAPLER VISISTAT 35W (STAPLE) ×3 IMPLANT
SUT ETHILON 2 0 PSLX (SUTURE) ×6 IMPLANT
SUT MNCRL AB 4-0 PS2 18 (SUTURE) IMPLANT
SUT MON AB 2-0 CT1 36 (SUTURE) ×4 IMPLANT
SUT VIC AB 1 CT1 36 (SUTURE) ×6 IMPLANT
SUT VIC AB 2-0 CT1 27 (SUTURE) ×6
SUT VIC AB 2-0 CT1 TAPERPNT 27 (SUTURE) ×2 IMPLANT
SWAB COLLECTION DEVICE MRSA (MISCELLANEOUS) IMPLANT
SWAB CULTURE ESWAB REG 1ML (MISCELLANEOUS) ×3 IMPLANT
TOWEL OR 17X26 10 PK STRL BLUE (TOWEL DISPOSABLE) ×6 IMPLANT

## 2018-05-14 NOTE — H&P (Signed)
PREOPERATIVE H&P  Chief Complaint: Delayed wound healing left hip  HPI: Evelyn Valdez is a 77 y.o. female who presents for preoperative history and physical with a diagnosis of Delayed wound healing left hip. She has elected for surgical management.   Past Medical History:  Diagnosis Date  . Anemia   . Anxiety   . Asthma    "well controlled"  . Depression   . Diverticulosis   . Dizziness   . DVT (deep venous thrombosis) ([place])    right lower leg after knee surgery  . Fibromyalgia   . High cholesterol   . History of anemia   . History of bronchitis   . History of kidney stones   . History of pneumonia   . Hypertension   . Insomnia   . Localized osteoarthritis of right shoulder   . Obesity   . Osteoarthritis   . Osteopenia   . Peripheral neuropathy    takes Topamax  . Pneumonia    hx of several times per patient   . PONV (postoperative nausea and vomiting)    nothing recent   . Pre-diabetes   . Vitamin D deficiency   . Wears glasses    Past Surgical History:  Procedure Laterality Date  . ABDOMINAL HYSTERECTOMY    . APPENDECTOMY    . BACK SURGERY    . BLADDER REPAIR    . BREAST BIOPSY Right    x2  . BREAST LUMPECTOMY Left   . CHOLECYSTECTOMY    . COLONOSCOPY W/ POLYPECTOMY    . ESOPHAGOGASTRODUODENOSCOPY    . EYE SURGERY Bilateral    cataracts  . FOOT FRACTURE SURGERY Left   . KNEE ARTHROPLASTY Right   . KNEE CARTILAGE SURGERY Right    x3  . MENISCUS REPAIR Left   . PLANTAR FASCIA SURGERY Bilateral   . RECTOCELE REPAIR    . REVERSE SHOULDER ARTHROPLASTY Right 09/25/2017   Procedure: RIGHT REVERSE SHOULDER ARTHROPLASTY;  Surgeon: Nicholes Stairs, MD;  Location: [place];  Service: Orthopedics;  Laterality: Right;  2.5 hrs  . SHOULDER ARTHROSCOPY     left  several surgeries  . SHOULDER SURGERY Right   . TOTAL HIP ARTHROPLASTY Right 06/24/2016  . TOTAL HIP ARTHROPLASTY Right 06/24/2016   Procedure: RIGHT TOTAL HIP ARTHROPLASTY ANTERIOR APPROACH;   Surgeon: Rod Can, MD;  Location: [place];  Service: Orthopedics;  Laterality: Right;  . TOTAL HIP ARTHROPLASTY Left 03/30/2018   Procedure: LEFT TOTAL HIP ARTHROPLASTY ANTERIOR APPROACH;  Surgeon: Rod Can, MD;  Location: [place];  Service: Orthopedics;  Laterality: Left;  . TUBAL LIGATION     Social History   Socioeconomic History  . Marital status: Married    Spouse name: Not on file  . Number of children: Not on file  . Years of education: Not on file  . Highest education level: Not on file  Occupational History  . Not on file  Social Needs  . Financial resource strain: Not on file  . Food insecurity:    Worry: Not on file    Inability: Not on file  . Transportation needs:    Medical: Not on file    Non-medical: Not on file  Tobacco Use  . Smoking status: Never Smoker  . Smokeless tobacco: Never Used  Substance and Sexual Activity  . Alcohol use: No  . Drug use: No  . Sexual activity: Not on file  Lifestyle  . Physical activity:    Days per week: Not on file  Minutes per session: Not on file  . Stress: Not on file  Relationships  . Social connections:    Talks on phone: Not on file    Gets together: Not on file    Attends religious service: Not on file    Active member of club or organization: Not on file    Attends meetings of clubs or organizations: Not on file    Relationship status: Not on file  Other Topics Concern  . Not on file  Social History Narrative  . Not on file   History reviewed. No pertinent family history. Allergies  Allergen Reactions  . Penicillins Swelling, Rash, Other (See Comments) and Hives    PATIENT HAS HAD A PCN REACTION WITH IMMEDIATE RASH, FACIAL/TONGUE/THROAT SWELLING, SOB, OR LIGHTHEADEDNESS WITH HYPOTENSION:  #  #  #  YES  #  #  #   Has patient had a PCN reaction causing severe rash involving mucus membranes or skin necrosis: No PATIENT HAS HAD A PCN REACTION THAT REQUIRED HOSPITALIZATION:  #  #  #  YES  #  #  #  Has  patient had a PCN reaction occurring within the last 10 years: No    . Adhesive [Tape] Other (See Comments)    Pulled skin off  . Advair Diskus [Fluticasone-Salmeterol] Other (See Comments)    headaches  . Pregabalin Other (See Comments)    Weight gain  . Nsaids     UNSPECIFIED REACTION   . Vancomycin Itching    Receiving vancomycin infusion, about 45 minutes into infusion pt c/o itching, face and head (scalp) is bright red.    . Baclofen Other (See Comments)    Patient doesn't feel herself on this medication   . Erythromycin Nausea And Vomiting  . Hydrocodone Nausea And Vomiting  . Morphine And Related Nausea And Vomiting  . Nickel Rash  . Percocet [Oxycodone-Acetaminophen] Nausea And Vomiting  . Sulfa Antibiotics Nausea And Vomiting and Rash  . Sulfasalazine Rash   Prior to Admission medications   Medication Sig Start Date End Date Taking? Authorizing Provider  amLODipine (NORVASC) 5 MG tablet Take 5 mg by mouth every evening.  02/23/18  Yes [provider]  APPLE CIDER VINEGAR PO Take 450 mg by mouth 3 (three) times daily with meals.   Yes [provider]  atorvastatin (LIPITOR) 20 MG tablet Take 20 mg by mouth every evening. 04/13/16  Yes [provider]  Biotin 10000 MCG TABS Take 10,000 mcg by mouth daily.   Yes [provider]  cholecalciferol (VITAMIN D) 1000 units tablet Take 1,000 Units by mouth 2 (two) times daily.    Yes [provider]  docusate sodium (COLACE) 100 MG capsule Take 1 capsule (100 mg total) by mouth 2 (two) times daily. Patient taking differently: Take 100 mg by mouth daily as needed for mild constipation.  03/31/18  Yes Adabelle Griffiths, Aaron Edelman, MD  DULoxetine (CYMBALTA) 60 MG capsule Take 60 mg by mouth every evening.  05/22/16  Yes [provider]  ferrous gluconate (FERGON) 324 MG tablet Take 324 mg by mouth daily with breakfast.   Yes [provider]  gabapentin (NEURONTIN) 300 MG capsule Take 600 mg  by mouth at bedtime. 02/22/18  Yes [provider]  HYDROmorphone (DILAUDID) 2 MG tablet Take 0.5-1 tablets (1-2 mg total) by mouth every 4 (four) hours as needed for moderate pain. 03/31/18  Yes Detravion Tester, Aaron Edelman, MD  ibuprofen (ADVIL,MOTRIN) 200 MG tablet Take 400 mg by mouth  every 8 (eight) hours as needed (for pain.).   Yes [provider]  irbesartan (AVAPRO) 300 MG tablet Take 300 mg by mouth daily.   Yes [provider]  L-Methylfolate-Algae-B12-B6 Glade Stanford) 3-90.314-2-35 MG CAPS Take 1 capsule by mouth daily.    Yes [provider]  Liniments (SALONPAS PAIN RELIEF PATCH EX) Apply 1 patch topically daily as needed (PAIN).   Yes [provider]  traZODone (DESYREL) 50 MG tablet Take 25 mg by mouth at bedtime.    Yes [provider]  albuterol (PROVENTIL HFA;VENTOLIN HFA) 108 (90 Base) MCG/ACT inhaler Inhale 1-2 puffs into the lungs every 6 (six) hours as needed for wheezing or shortness of breath.    [provider]  apixaban (ELIQUIS) 2.5 MG TABS tablet Take 1 tablet (2.5 mg total) by mouth every 12 (twelve) hours. Patient not taking: Reported on 05/12/2018 03/31/18   Rod Can, MD  furosemide (LASIX) 20 MG tablet Take 20 mg by mouth daily as needed (for swelling/edema).     [provider]  ondansetron (ZOFRAN) 4 MG tablet Take 1 tablet (4 mg total) by mouth every 6 (six) hours as needed for nausea. 03/31/18   Manahil Vanzile, Aaron Edelman, MD  senna (SENOKOT) 8.6 MG TABS tablet Take 1 tablet (8.6 mg total) by mouth 2 (two) times daily. Patient not taking: Reported on 05/12/2018 03/31/18   Rod Can, MD     Positive ROS: All other systems have been reviewed and were otherwise negative with the exception of those mentioned in the HPI and as above.  Physical Exam: General: Alert, no acute distress Cardiovascular: No pedal edema Respiratory: No cyanosis, no use of accessory musculature GI: No organomegaly, abdomen is soft and  non-tender Skin: No lesions in the area of chief complaint Neurologic: Sensation intact distally Psychiatric: Patient is competent for consent with normal mood and affect Lymphatic: No axillary or cervical lymphadenopathy  MUSCULOSKELETAL: Examination of the left hip reveals that her incision is entirely healed except for the most proximal aspect, where she has a 4 mm superficial skin dehiscence.  No erythema or drainage.  No pain with passive range of motion of the hip.  She is neurovascularly intact.  Assessment: Delayed wound healing left hip  Plan: Plan for Procedure(s): IRRIGATION AND DEBRIDEMENT HIP  The risks benefits and alternatives were discussed with the patient including but not limited to the risks of nonoperative treatment, versus surgical intervention including infection, bleeding, nerve injury,  blood clots, cardiopulmonary complications, morbidity, mortality, among others, and they were willing to proceed.   Bertram Savin, MD Cell [phone]   05/14/2018 12:47 PM

## 2018-05-14 NOTE — Anesthesia Preprocedure Evaluation (Signed)
Anesthesia Evaluation  Patient identified by MRN, date of birth, ID band Patient awake    Reviewed: Allergy & Precautions, H&P , NPO status , Patient's Chart, lab work & pertinent test results, reviewed documented beta blocker date and time   History of Anesthesia Complications (+) PONV  Airway Mallampati: II  TM Distance: >3 FB Neck ROM: full    Dental no notable dental hx.    Pulmonary asthma ,    Pulmonary exam normal breath sounds clear to auscultation       Cardiovascular hypertension, On Medications  Rhythm:regular Rate:Normal     Neuro/Psych PSYCHIATRIC DISORDERS Anxiety Depression  Neuromuscular disease    GI/Hepatic   Endo/Other  Morbid obesity  Renal/GU      Musculoskeletal  (+) Arthritis , Osteoarthritis,  Fibromyalgia -  Abdominal   Peds  Hematology  (+) anemia ,   Anesthesia Other Findings   Reproductive/Obstetrics                             Anesthesia Physical  Anesthesia Plan  ASA: III  Anesthesia Plan: General   Post-op Pain Management:  Regional for Post-op pain   Induction: Intravenous  PONV Risk Score and Plan: 3 and Ondansetron, Treatment may vary due to age or medical condition, Dexamethasone and Midazolam  Airway Management Planned: LMA  Additional Equipment:   Intra-op Plan:   Post-operative Plan:   Informed Consent: I have reviewed the patients History and Physical, chart, labs and discussed the procedure including the risks, benefits and alternatives for the proposed anesthesia with the patient or authorized representative who has indicated his/her understanding and acceptance.   Dental Advisory Given  Plan Discussed with: CRNA, Surgeon and Anesthesiologist  Anesthesia Plan Comments: (  )        Anesthesia Quick Evaluation

## 2018-05-14 NOTE — Discharge Instructions (Signed)
Keep VAC dressing clean and dry.  Do not remove. Charge VAC unit nightly. Call [phone] as soon as possible to schedule a follow-up appointment on 05/22/2018 for VAC removal.

## 2018-05-14 NOTE — Op Note (Signed)
OPERATIVE REPORT   05/14/2018  2:35 PM  PATIENT:  Evelyn Valdez   SURGEON:  Bertram Savin, MD  ASSISTANT: Nehemiah Massed, PA-C.   PREOPERATIVE DIAGNOSIS:  Delayed wound healing left hip.  POSTOPERATIVE DIAGNOSIS:  Same.  PROCEDURE: Excisional debridement of skin and subcutaneous tissue left hip with primary closure of 4 cm wound. Application of incisional wound VAC.  ANESTHESIA:   GETA.  ANTIBIOTICS: 1 g vancomycin.  IMPLANTS: None.  SPECIMENS: None.  COMPLICATIONS: None.  DISPOSITION: Stable to PACU.  SURGICAL INDICATIONS:  Evelyn Valdez is a 77 y.o. female with a diagnosis of Delayed wound healing left hip after undergoing recent left total hip arthroplasty on 03/30/2018.  She developed an area in the pannicular fold where the skin had superficially dehisced.  There is no current evidence of infection.  Patient is doing very well clinically.  The risks, benefits, and alternatives were discussed with the patient preoperatively including but not limited to the risks of infection, bleeding, nerve / blood vessel injury, malunion, nonunion, cardiopulmonary complications, the need for repeat surgery, among others, and the patient was willing to proceed.  PROCEDURE IN DETAIL: Identified the patient holding area using 2 identifiers.  The surgical site was marked by myself.  She was taken the operating room and general anesthesia was induced on the bed.  She was then transferred on the [facility] table.  The left hip was then prepped and draped in the normal sterile surgical fashion.  Timeout was called, verifying site and site of surgery.  She did receive IV antibiotics within 60 minutes of beginning the procedure.  I used a #10 blade to excisionally ellipse the area around the delayed wound healing.  I performed a excisional debridement of skin and subcutaneous tissue.  There is no draining sinus tract, purulence, or evidence of infection.  The wound is contained to the  subcutaneous superficial tissue.  Meticulous hemostasis was achieved with Bovie electrocautery.  I irrigated the wound with normal saline using pulse lavage.  The wound was closed in layers with 2-0 Monocryl for the deep dermal layer, 2-0 nylon mattress suture for the skin. Prevena incisional VAC was applied according to manufacturer's instructions.  She was then extubated and taken to the PACU in stable condition.  Sponge, needle, and instrument counts were correct at the end of the case x2.  There were no known complications.  POSTOPERATIVE PLAN: Discharge home from PACU with portable incisional VAC.  No antibiotics required.  Return to the office in 7 days for [facility] removal.

## 2018-05-14 NOTE — Transfer of Care (Signed)
Immediate Anesthesia Transfer of Care Note  Patient: Evelyn Valdez  Procedure(s) Performed: IRRIGATION AND DEBRIDEMENT HIP (Left Hip)  Patient Location: PACU  Anesthesia Type:General  Level of Consciousness: awake, alert , oriented and patient cooperative  Airway & Oxygen Therapy: Patient Spontanous Breathing and Patient connected to face mask oxygen  Post-op Assessment: Report given to RN, Post -op Vital signs reviewed and stable and Patient moving all extremities X 4  Post vital signs: stable  Last Vitals:  Vitals Value Taken Time  BP 148/73 05/14/2018  3:09 PM  Temp    Pulse 85 05/14/2018  3:15 PM  Resp 9 05/14/2018  3:15 PM  SpO2 99 % 05/14/2018  3:15 PM  Vitals shown include unvalidated device data.  Last Pain:  Vitals:   05/14/18 1043  TempSrc: Oral         Complications: No apparent anesthesia complications

## 2018-05-14 NOTE — Anesthesia Postprocedure Evaluation (Signed)
Anesthesia Post Note  Patient: Evelyn Valdez  Procedure(s) Performed: IRRIGATION AND DEBRIDEMENT HIP (Left Hip)     Patient location during evaluation: PACU Anesthesia Type: General Level of consciousness: awake and alert Pain management: pain level controlled Vital Signs Assessment: post-procedure vital signs reviewed and stable Respiratory status: spontaneous breathing, nonlabored ventilation, respiratory function stable and patient connected to nasal cannula oxygen Cardiovascular status: blood pressure returned to baseline and stable Postop Assessment: no apparent nausea or vomiting Anesthetic complications: no    Last Vitals:  Vitals:   05/14/18 1550 05/14/18 1608  BP: (!) 151/76 (!) 160/78  Pulse: 83 99  Resp: 17 18  Temp:  37 C  SpO2: 94% 92%    Last Pain:  Vitals:   05/14/18 1608  TempSrc:   PainSc: 3                  Montez Hageman

## 2018-05-14 NOTE — Anesthesia Procedure Notes (Signed)
Procedure Name: LMA Insertion Date/Time: 05/14/2018 2:09 PM Performed by: Cynda Familia, CRNA Pre-anesthesia Checklist: Patient identified, Emergency Drugs available, Suction available and Patient being monitored Patient Re-evaluated:Patient Re-evaluated prior to induction Oxygen Delivery Method: Circle System Utilized Preoxygenation: Pre-oxygenation with 100% oxygen Induction Type: IV induction Ventilation: Mask ventilation without difficulty LMA: LMA inserted LMA Size: 4.0 Tube type: Oral (20 cc air) Number of attempts: 1 Placement Confirmation: positive ETCO2 Tube secured with: Tape Dental Injury: Teeth and Oropharynx as per pre-operative assessment  Comments: Smooth IV induction Carignan- -LMA AM CRNA atraumatic-- teeth and mouth as preop-- bilat BS

## 2018-05-15 LAB — URINE CULTURE: CULTURE: NO GROWTH

## 2018-05-20 ENCOUNTER — Encounter (HOSPITAL_COMMUNITY): Payer: Self-pay | Admitting: Orthopedic Surgery

## 2018-06-02 DIAGNOSIS — Z96643 Presence of artificial hip joint, bilateral: Secondary | ICD-10-CM | POA: Diagnosis not present

## 2018-06-02 DIAGNOSIS — M48061 Spinal stenosis, lumbar region without neurogenic claudication: Secondary | ICD-10-CM | POA: Diagnosis not present

## 2018-06-02 DIAGNOSIS — M7061 Trochanteric bursitis, right hip: Secondary | ICD-10-CM | POA: Diagnosis not present

## 2018-06-02 DIAGNOSIS — G8929 Other chronic pain: Secondary | ICD-10-CM | POA: Diagnosis not present

## 2018-06-02 DIAGNOSIS — R2689 Other abnormalities of gait and mobility: Secondary | ICD-10-CM | POA: Diagnosis not present

## 2018-06-02 DIAGNOSIS — G2581 Restless legs syndrome: Secondary | ICD-10-CM | POA: Diagnosis not present

## 2018-06-02 DIAGNOSIS — M7062 Trochanteric bursitis, left hip: Secondary | ICD-10-CM | POA: Diagnosis not present

## 2018-06-02 DIAGNOSIS — M797 Fibromyalgia: Secondary | ICD-10-CM | POA: Diagnosis not present

## 2018-06-02 DIAGNOSIS — R29898 Other symptoms and signs involving the musculoskeletal system: Secondary | ICD-10-CM | POA: Diagnosis not present

## 2018-06-02 DIAGNOSIS — M25512 Pain in left shoulder: Secondary | ICD-10-CM | POA: Diagnosis not present

## 2018-06-02 DIAGNOSIS — Z471 Aftercare following joint replacement surgery: Secondary | ICD-10-CM | POA: Diagnosis not present

## 2018-06-02 DIAGNOSIS — E538 Deficiency of other specified B group vitamins: Secondary | ICD-10-CM | POA: Diagnosis not present

## 2018-06-02 DIAGNOSIS — M25511 Pain in right shoulder: Secondary | ICD-10-CM | POA: Diagnosis not present

## 2018-06-02 DIAGNOSIS — Z96611 Presence of right artificial shoulder joint: Secondary | ICD-10-CM | POA: Diagnosis not present

## 2018-06-02 DIAGNOSIS — G4700 Insomnia, unspecified: Secondary | ICD-10-CM | POA: Diagnosis not present

## 2018-06-02 DIAGNOSIS — M25551 Pain in right hip: Secondary | ICD-10-CM | POA: Diagnosis not present

## 2018-06-02 DIAGNOSIS — G609 Hereditary and idiopathic neuropathy, unspecified: Secondary | ICD-10-CM | POA: Diagnosis not present

## 2018-06-02 DIAGNOSIS — Z4789 Encounter for other orthopedic aftercare: Secondary | ICD-10-CM | POA: Diagnosis not present

## 2018-06-18 DIAGNOSIS — M48062 Spinal stenosis, lumbar region with neurogenic claudication: Secondary | ICD-10-CM | POA: Diagnosis not present

## 2018-06-18 DIAGNOSIS — G894 Chronic pain syndrome: Secondary | ICD-10-CM | POA: Diagnosis not present

## 2018-06-29 DIAGNOSIS — M5441 Lumbago with sciatica, right side: Secondary | ICD-10-CM | POA: Diagnosis not present

## 2018-06-29 DIAGNOSIS — M5442 Lumbago with sciatica, left side: Secondary | ICD-10-CM | POA: Diagnosis not present

## 2018-06-29 DIAGNOSIS — G8929 Other chronic pain: Secondary | ICD-10-CM | POA: Diagnosis not present

## 2018-06-29 DIAGNOSIS — M5416 Radiculopathy, lumbar region: Secondary | ICD-10-CM | POA: Diagnosis not present

## 2018-07-06 DIAGNOSIS — D472 Monoclonal gammopathy: Secondary | ICD-10-CM | POA: Diagnosis not present

## 2018-07-06 DIAGNOSIS — Z96642 Presence of left artificial hip joint: Secondary | ICD-10-CM | POA: Diagnosis not present

## 2018-07-06 DIAGNOSIS — K922 Gastrointestinal hemorrhage, unspecified: Secondary | ICD-10-CM | POA: Diagnosis not present

## 2018-07-06 DIAGNOSIS — D500 Iron deficiency anemia secondary to blood loss (chronic): Secondary | ICD-10-CM | POA: Diagnosis not present

## 2018-07-24 DIAGNOSIS — M5136 Other intervertebral disc degeneration, lumbar region: Secondary | ICD-10-CM | POA: Diagnosis not present

## 2018-07-24 DIAGNOSIS — M419 Scoliosis, unspecified: Secondary | ICD-10-CM | POA: Diagnosis not present

## 2018-07-24 DIAGNOSIS — M8580 Other specified disorders of bone density and structure, unspecified site: Secondary | ICD-10-CM | POA: Diagnosis not present

## 2018-07-24 DIAGNOSIS — M4310 Spondylolisthesis, site unspecified: Secondary | ICD-10-CM | POA: Diagnosis not present

## 2018-07-24 DIAGNOSIS — I739 Peripheral vascular disease, unspecified: Secondary | ICD-10-CM | POA: Diagnosis not present

## 2018-07-24 DIAGNOSIS — M48061 Spinal stenosis, lumbar region without neurogenic claudication: Secondary | ICD-10-CM | POA: Diagnosis not present

## 2018-07-24 DIAGNOSIS — M545 Low back pain: Secondary | ICD-10-CM | POA: Diagnosis not present

## 2018-07-24 DIAGNOSIS — G609 Hereditary and idiopathic neuropathy, unspecified: Secondary | ICD-10-CM | POA: Diagnosis not present

## 2018-07-24 DIAGNOSIS — Z6841 Body Mass Index (BMI) 40.0 and over, adult: Secondary | ICD-10-CM | POA: Diagnosis not present

## 2018-07-24 DIAGNOSIS — M797 Fibromyalgia: Secondary | ICD-10-CM | POA: Diagnosis not present

## 2018-08-06 DIAGNOSIS — M5136 Other intervertebral disc degeneration, lumbar region: Secondary | ICD-10-CM | POA: Diagnosis not present

## 2018-08-21 DIAGNOSIS — M4310 Spondylolisthesis, site unspecified: Secondary | ICD-10-CM | POA: Diagnosis not present

## 2018-08-21 DIAGNOSIS — M797 Fibromyalgia: Secondary | ICD-10-CM | POA: Diagnosis not present

## 2018-08-21 DIAGNOSIS — M419 Scoliosis, unspecified: Secondary | ICD-10-CM | POA: Diagnosis not present

## 2018-08-21 DIAGNOSIS — M5136 Other intervertebral disc degeneration, lumbar region: Secondary | ICD-10-CM | POA: Diagnosis not present

## 2018-08-21 DIAGNOSIS — M48061 Spinal stenosis, lumbar region without neurogenic claudication: Secondary | ICD-10-CM | POA: Diagnosis not present

## 2018-08-21 DIAGNOSIS — G609 Hereditary and idiopathic neuropathy, unspecified: Secondary | ICD-10-CM | POA: Diagnosis not present

## 2018-08-31 DIAGNOSIS — M159 Polyosteoarthritis, unspecified: Secondary | ICD-10-CM | POA: Diagnosis not present

## 2018-08-31 DIAGNOSIS — G894 Chronic pain syndrome: Secondary | ICD-10-CM | POA: Diagnosis not present

## 2018-09-07 DIAGNOSIS — M8588 Other specified disorders of bone density and structure, other site: Secondary | ICD-10-CM | POA: Diagnosis not present

## 2018-09-07 DIAGNOSIS — M810 Age-related osteoporosis without current pathological fracture: Secondary | ICD-10-CM | POA: Diagnosis not present

## 2018-09-07 DIAGNOSIS — Z780 Asymptomatic menopausal state: Secondary | ICD-10-CM | POA: Diagnosis not present

## 2018-09-17 DIAGNOSIS — M47896 Other spondylosis, lumbar region: Secondary | ICD-10-CM | POA: Diagnosis not present

## 2018-09-28 DIAGNOSIS — M533 Sacrococcygeal disorders, not elsewhere classified: Secondary | ICD-10-CM | POA: Diagnosis not present

## 2018-10-10 IMAGING — DX DG SHOULDER 2+V PORT*R*
1 series · 1 of 1 positions shown · non-contrast
Comparison: Portable exam 4266 hours without priors for comparison

CLINICAL DATA: Post RIGHT shoulder surgery

EXAM:
PORTABLE RIGHT SHOULDER

[shoulder ap]
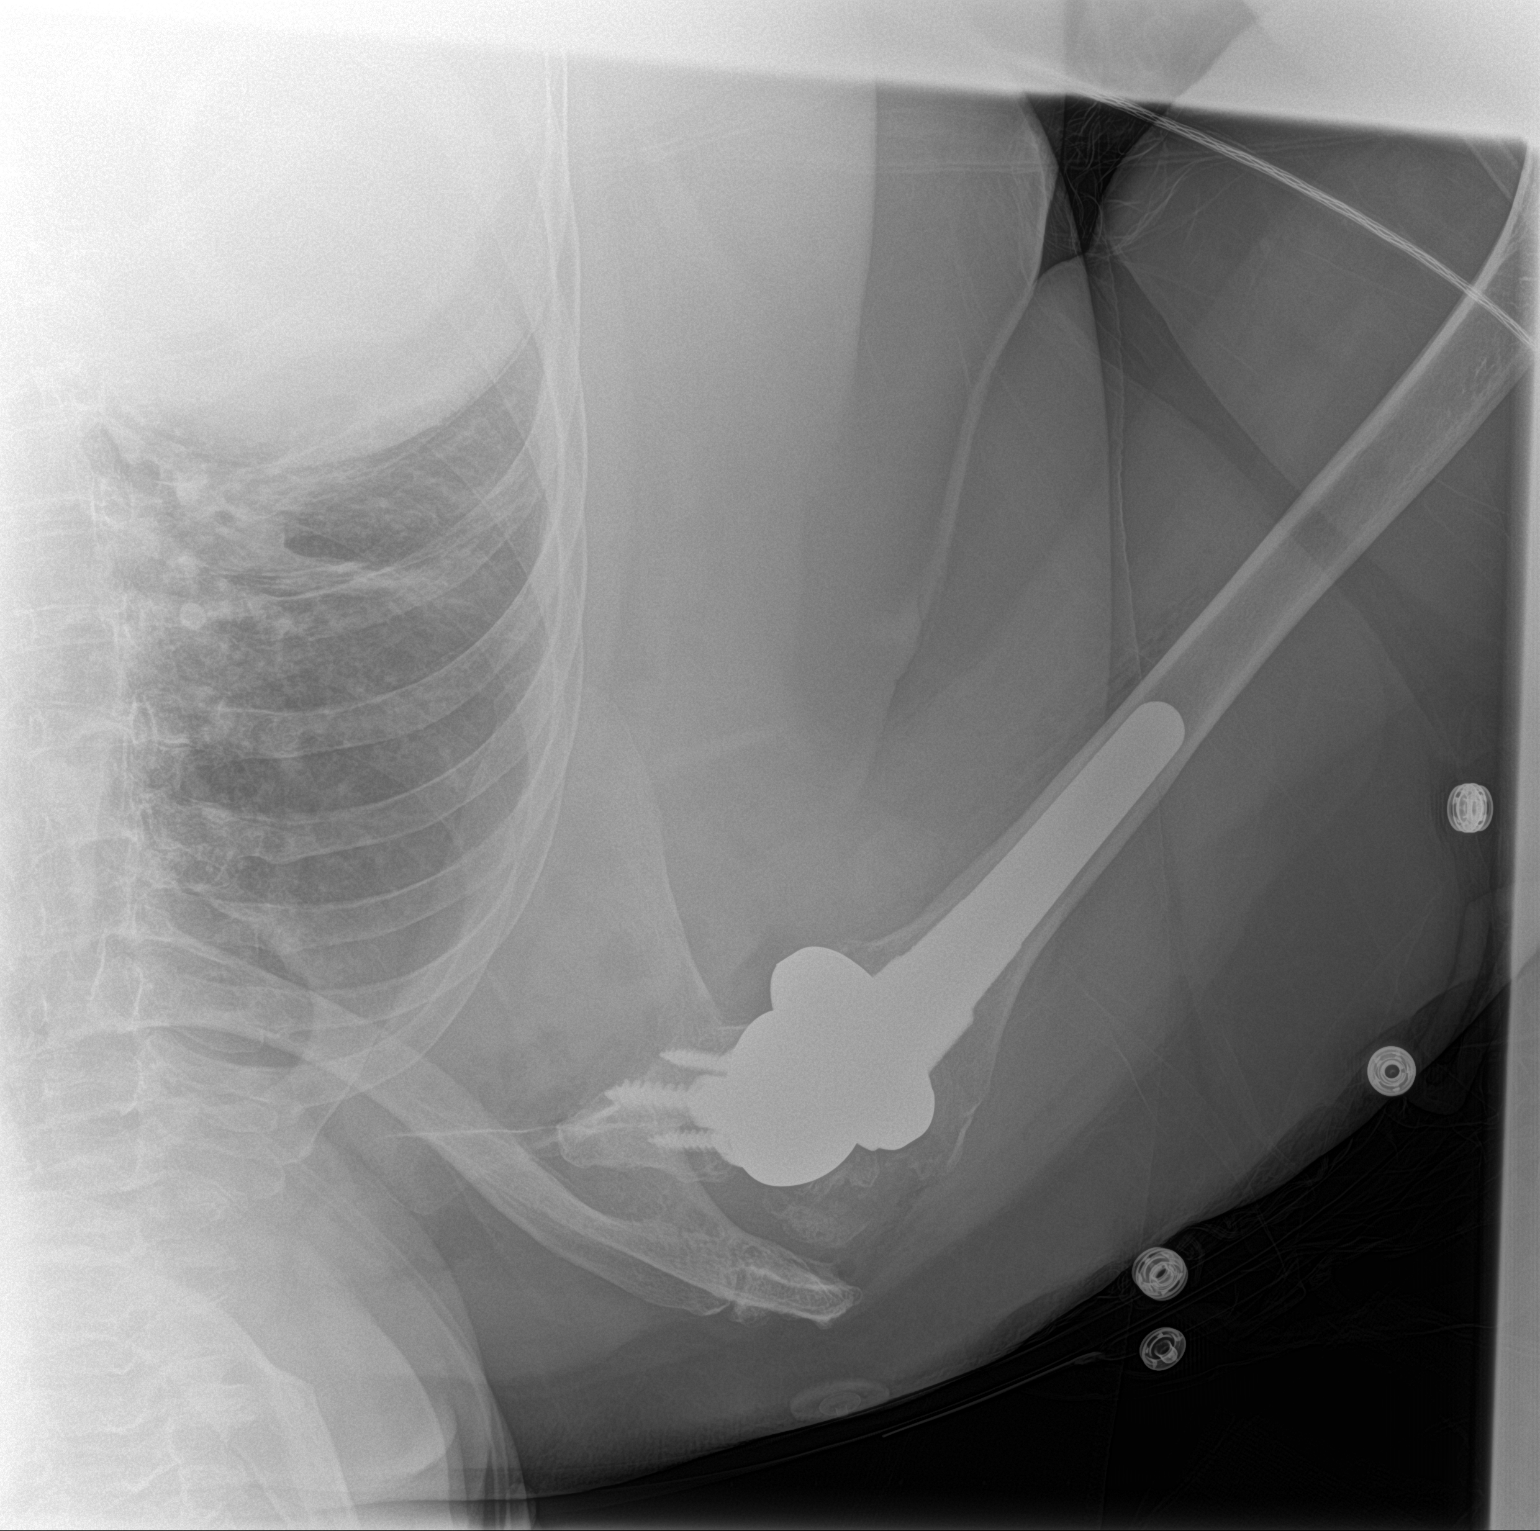

[1 of 1 positions shown; findings below may reference images not displayed]

FINDINGS: Osseous demineralization.

Components of a RIGHT reverse shoulder arthroplasty are identified.

No evidence of fracture dislocation seen on single AP view.

Bone fragments are seen cranial to the prosthetic joint.

AC joint alignment normal.

Mild RIGHT basilar atelectasis.
IMPRESSION: RIGHT reverse shoulder arthroplasty without acute abnormalities.

## 2018-10-14 DIAGNOSIS — M533 Sacrococcygeal disorders, not elsewhere classified: Secondary | ICD-10-CM | POA: Diagnosis not present

## 2018-10-28 DIAGNOSIS — G894 Chronic pain syndrome: Secondary | ICD-10-CM | POA: Diagnosis not present

## 2019-01-05 DIAGNOSIS — M1612 Unilateral primary osteoarthritis, left hip: Secondary | ICD-10-CM | POA: Diagnosis not present

## 2019-01-05 DIAGNOSIS — T8484XD Pain due to internal orthopedic prosthetic devices, implants and grafts, subsequent encounter: Secondary | ICD-10-CM | POA: Diagnosis not present

## 2019-01-05 DIAGNOSIS — M1712 Unilateral primary osteoarthritis, left knee: Secondary | ICD-10-CM | POA: Diagnosis not present

## 2019-01-05 DIAGNOSIS — D500 Iron deficiency anemia secondary to blood loss (chronic): Secondary | ICD-10-CM | POA: Diagnosis not present

## 2019-01-05 DIAGNOSIS — D472 Monoclonal gammopathy: Secondary | ICD-10-CM | POA: Diagnosis not present

## 2019-01-19 DIAGNOSIS — M1712 Unilateral primary osteoarthritis, left knee: Secondary | ICD-10-CM | POA: Diagnosis not present

## 2019-01-19 DIAGNOSIS — T8484XD Pain due to internal orthopedic prosthetic devices, implants and grafts, subsequent encounter: Secondary | ICD-10-CM | POA: Diagnosis not present

## 2019-02-08 DIAGNOSIS — Z6841 Body Mass Index (BMI) 40.0 and over, adult: Secondary | ICD-10-CM | POA: Diagnosis not present

## 2019-02-08 DIAGNOSIS — G4719 Other hypersomnia: Secondary | ICD-10-CM | POA: Diagnosis not present

## 2019-02-08 DIAGNOSIS — F330 Major depressive disorder, recurrent, mild: Secondary | ICD-10-CM | POA: Diagnosis not present

## 2019-02-08 DIAGNOSIS — L304 Erythema intertrigo: Secondary | ICD-10-CM | POA: Diagnosis not present

## 2019-02-23 DIAGNOSIS — M5442 Lumbago with sciatica, left side: Secondary | ICD-10-CM | POA: Diagnosis not present

## 2019-02-23 DIAGNOSIS — G44229 Chronic tension-type headache, not intractable: Secondary | ICD-10-CM | POA: Diagnosis not present

## 2019-02-23 DIAGNOSIS — M797 Fibromyalgia: Secondary | ICD-10-CM | POA: Diagnosis not present

## 2019-02-23 DIAGNOSIS — D472 Monoclonal gammopathy: Secondary | ICD-10-CM | POA: Diagnosis not present

## 2019-02-23 DIAGNOSIS — G609 Hereditary and idiopathic neuropathy, unspecified: Secondary | ICD-10-CM | POA: Diagnosis not present

## 2019-02-23 DIAGNOSIS — G4719 Other hypersomnia: Secondary | ICD-10-CM | POA: Diagnosis not present

## 2019-02-23 DIAGNOSIS — M5441 Lumbago with sciatica, right side: Secondary | ICD-10-CM | POA: Diagnosis not present

## 2019-02-25 DIAGNOSIS — Z96611 Presence of right artificial shoulder joint: Secondary | ICD-10-CM | POA: Diagnosis not present

## 2019-02-25 DIAGNOSIS — M19012 Primary osteoarthritis, left shoulder: Secondary | ICD-10-CM | POA: Diagnosis not present

## 2019-02-25 DIAGNOSIS — M25512 Pain in left shoulder: Secondary | ICD-10-CM | POA: Diagnosis not present

## 2019-02-25 DIAGNOSIS — Z471 Aftercare following joint replacement surgery: Secondary | ICD-10-CM | POA: Diagnosis not present

## 2019-03-23 DIAGNOSIS — Z1231 Encounter for screening mammogram for malignant neoplasm of breast: Secondary | ICD-10-CM | POA: Diagnosis not present

## 2019-04-12 DIAGNOSIS — G894 Chronic pain syndrome: Secondary | ICD-10-CM | POA: Diagnosis not present

## 2019-04-12 DIAGNOSIS — Z79899 Other long term (current) drug therapy: Secondary | ICD-10-CM | POA: Diagnosis not present

## 2019-04-14 IMAGING — DX DG PORTABLE PELVIS
1 series · 1 of 1 positions shown · non-contrast
Comparison: 03/30/2018 intraoperative C-arm views.

CLINICAL DATA: 77-year-old female post total left hip arthroplasty.
Subsequent encounter.

EXAM:
PORTABLE PELVIS 1-2 VIEWS

[pelvis ap]
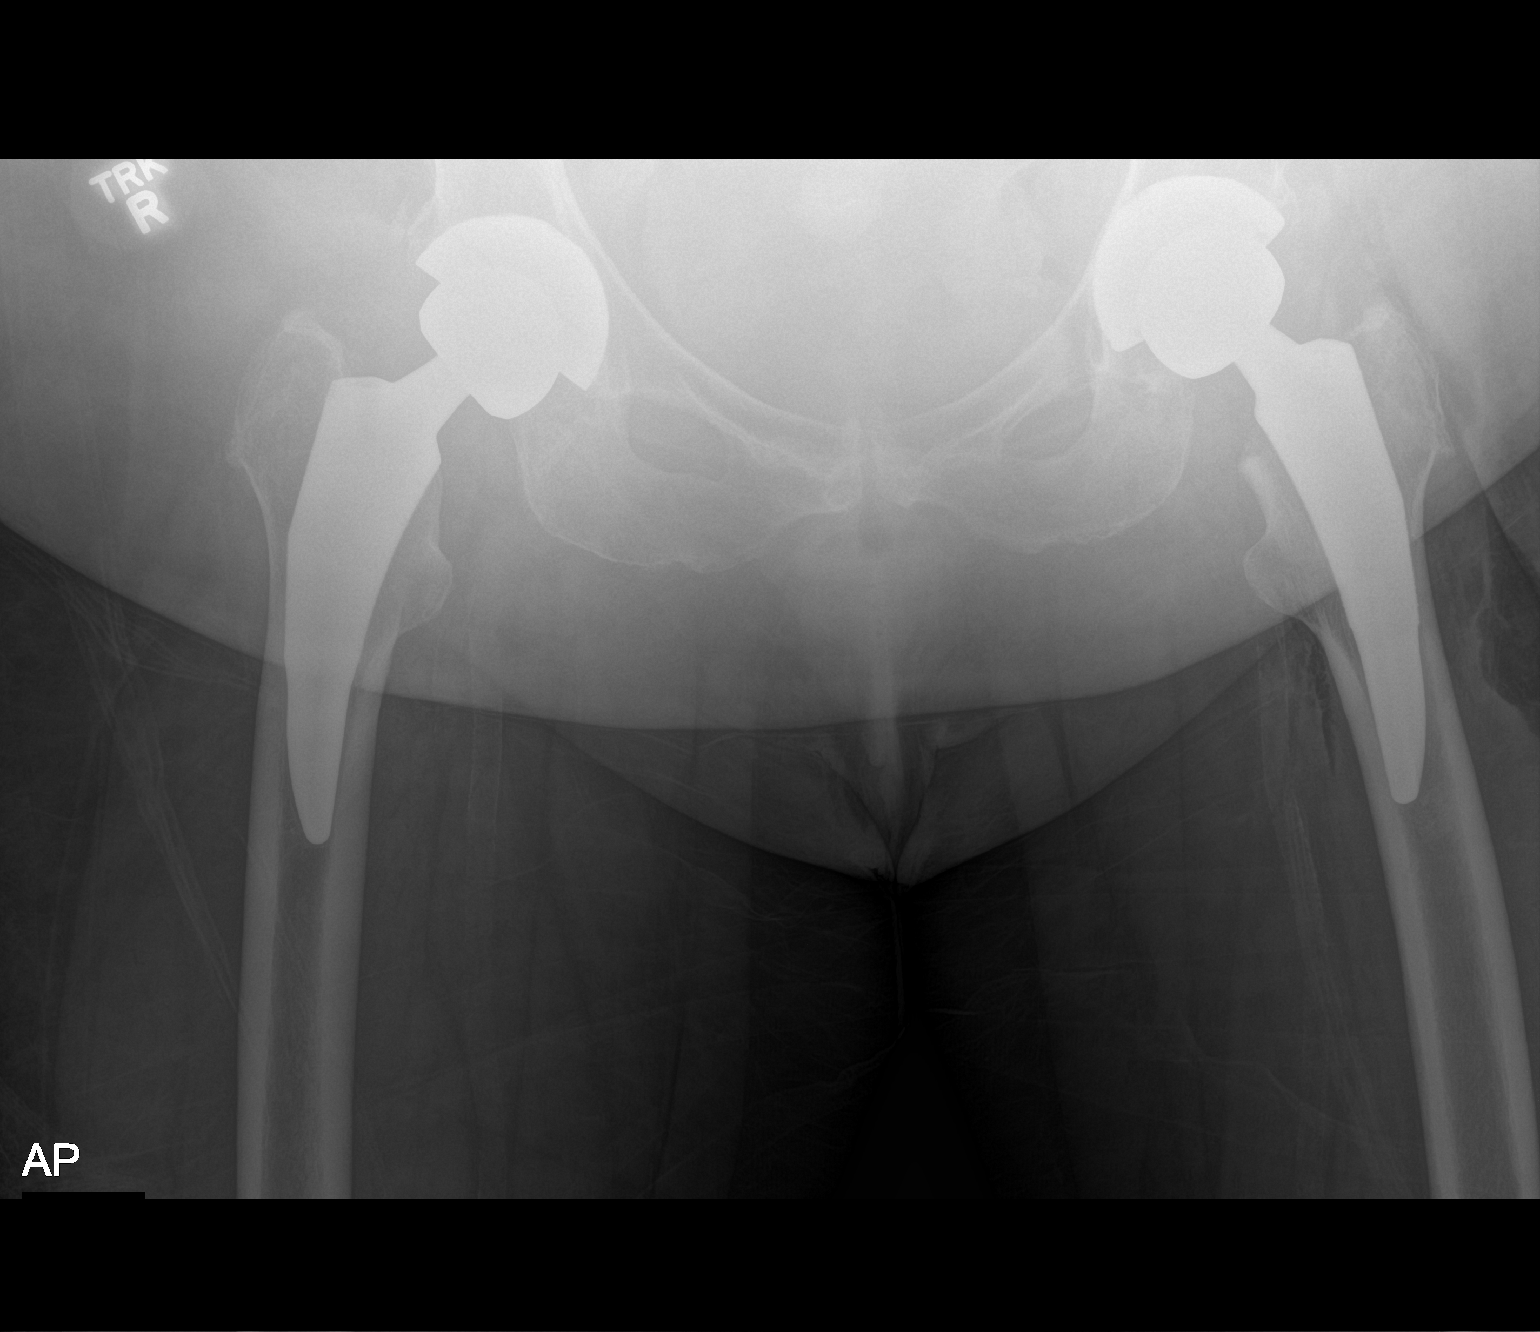

[1 of 1 positions shown; findings below may reference images not displayed]

FINDINGS: Post recent total left hip replacement. Question minimal cortical
interruption of the left pelvis adjacent to acetabular component.
Femoral component unremarkable on single frontal projection.

Post remote right hip replacement.

Vascular calcifications.
IMPRESSION: Post recent total left hip replacement. Question minimal cortical
interruption of the left pelvis adjacent to acetabular component.
This can be further assessed with cone-down views/oblique views if
clinically desired.

These results will be called to the ordering clinician or
representative by the Radiologist Assistant, and communication
documented in the PACS or zVision Dashboard.

## 2019-04-14 IMAGING — RF DG C-ARM 61-120 MIN
1 series · 4 of 4 positions shown · non-contrast
Comparison: None.

CLINICAL DATA: 77-year-old female post left hip replacement.
Initial encounter.

EXAM:
DG C-ARM 61-120 MIN; OPERATIVE LEFT HIP WITH PELVIS
Fluoroscopic time: 21 seconds.

[Series 1: run · 4 of 4 slices shown]
[im 1/4]
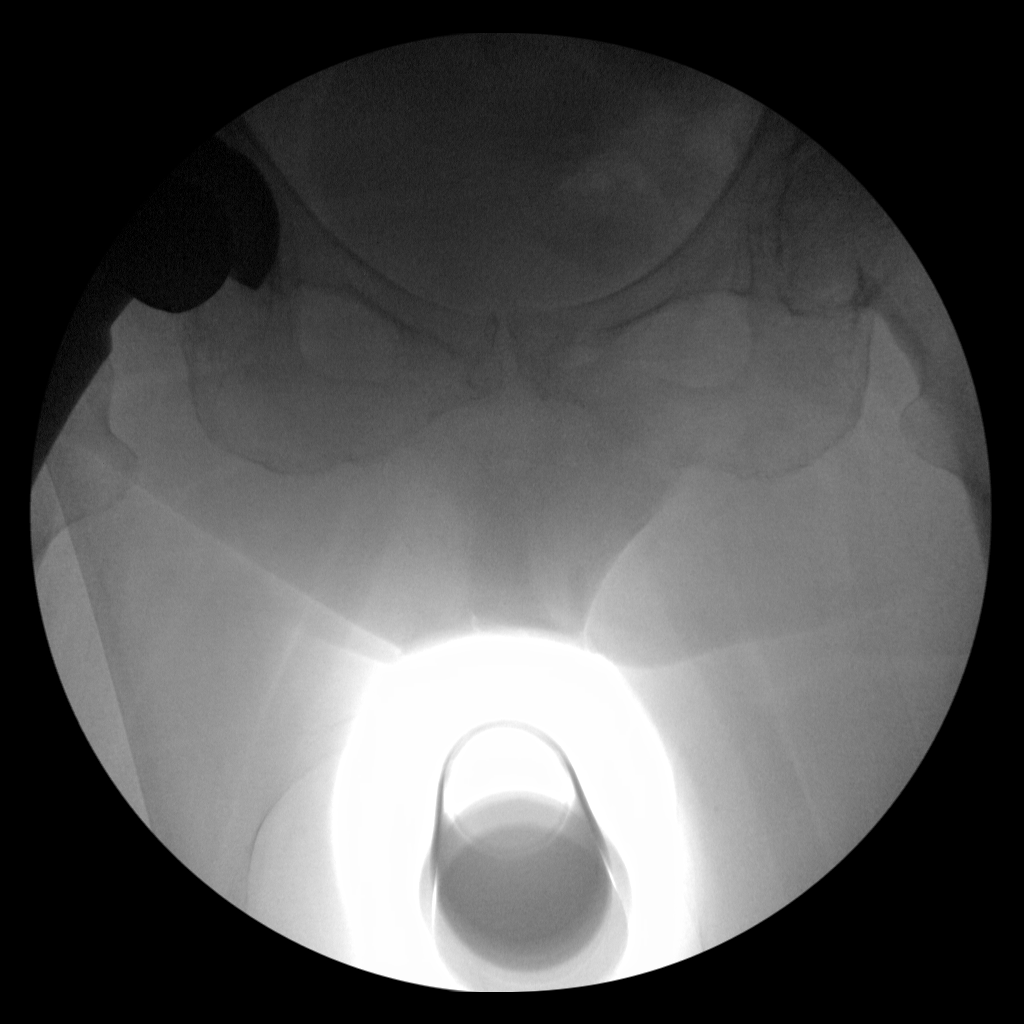
[im 2/4]
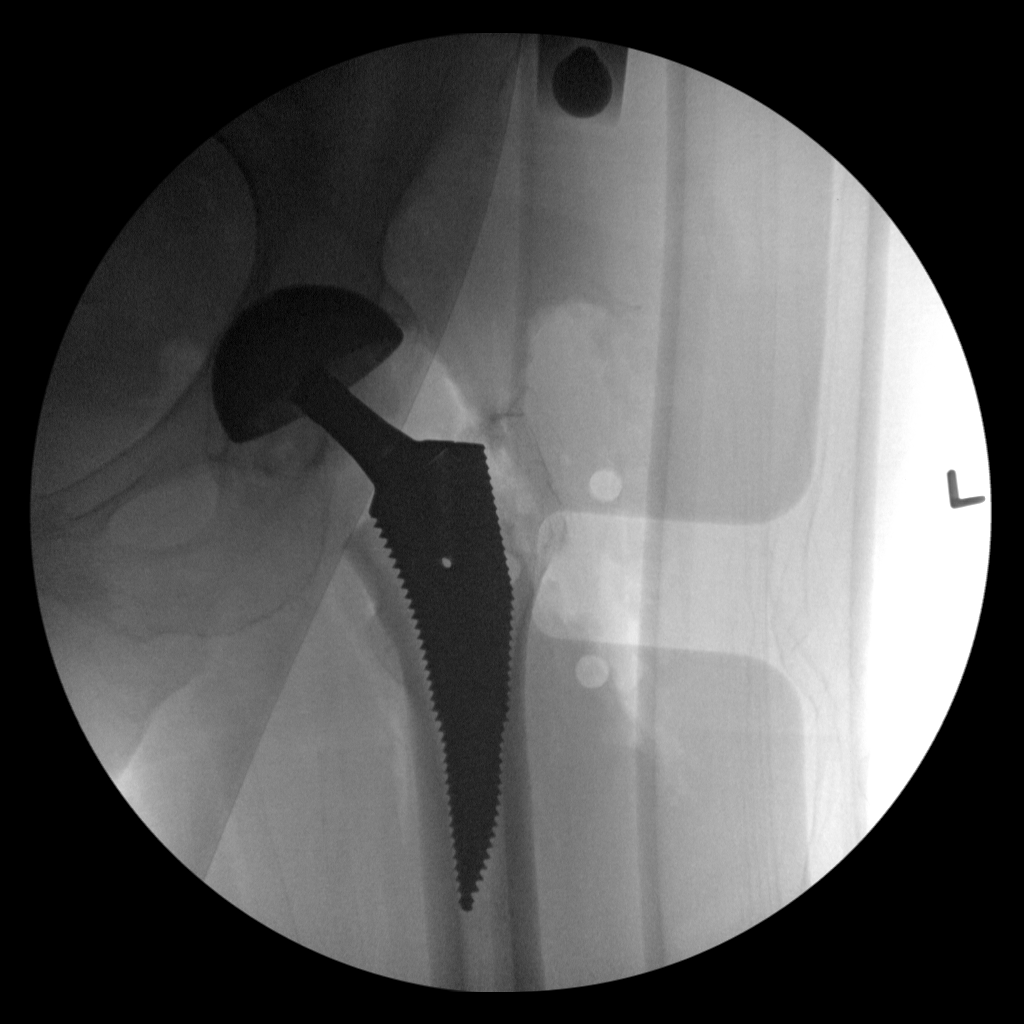
[im 3/4]
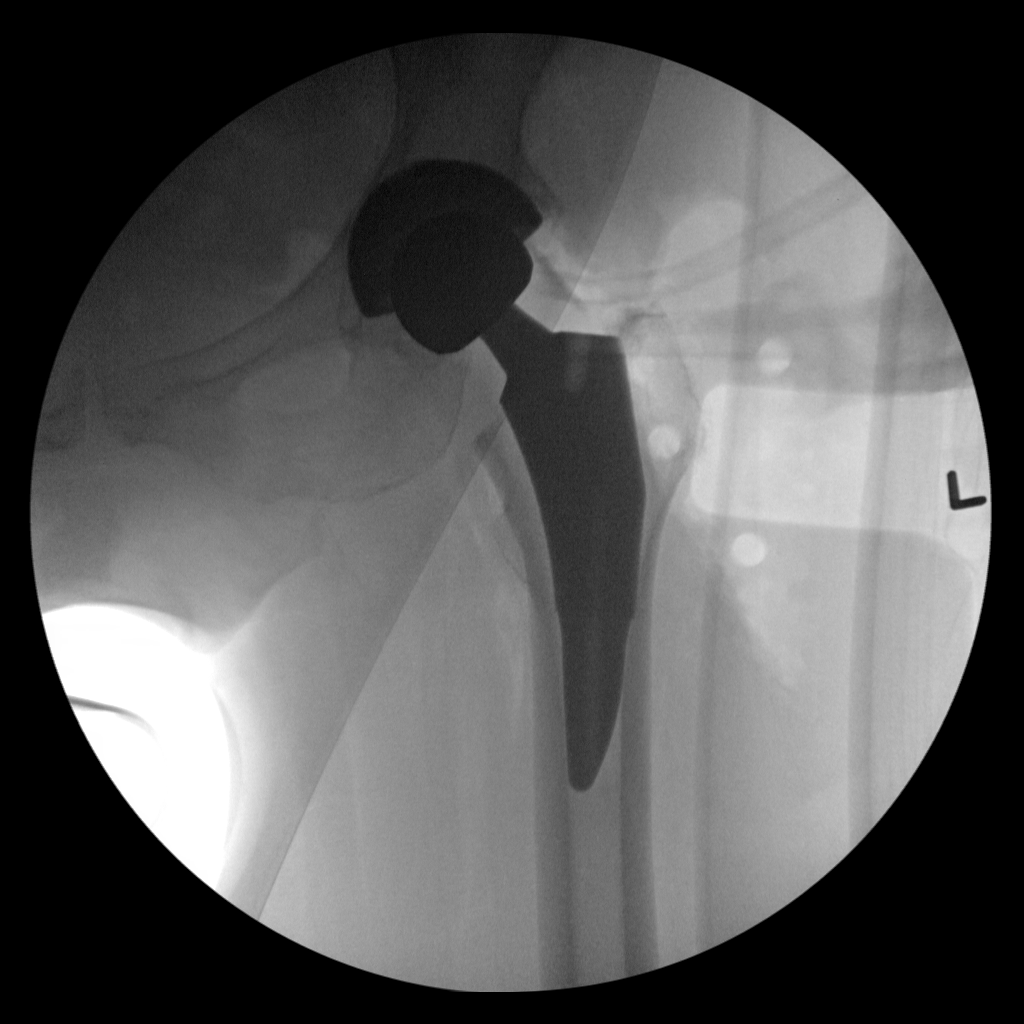
[im 4/4]
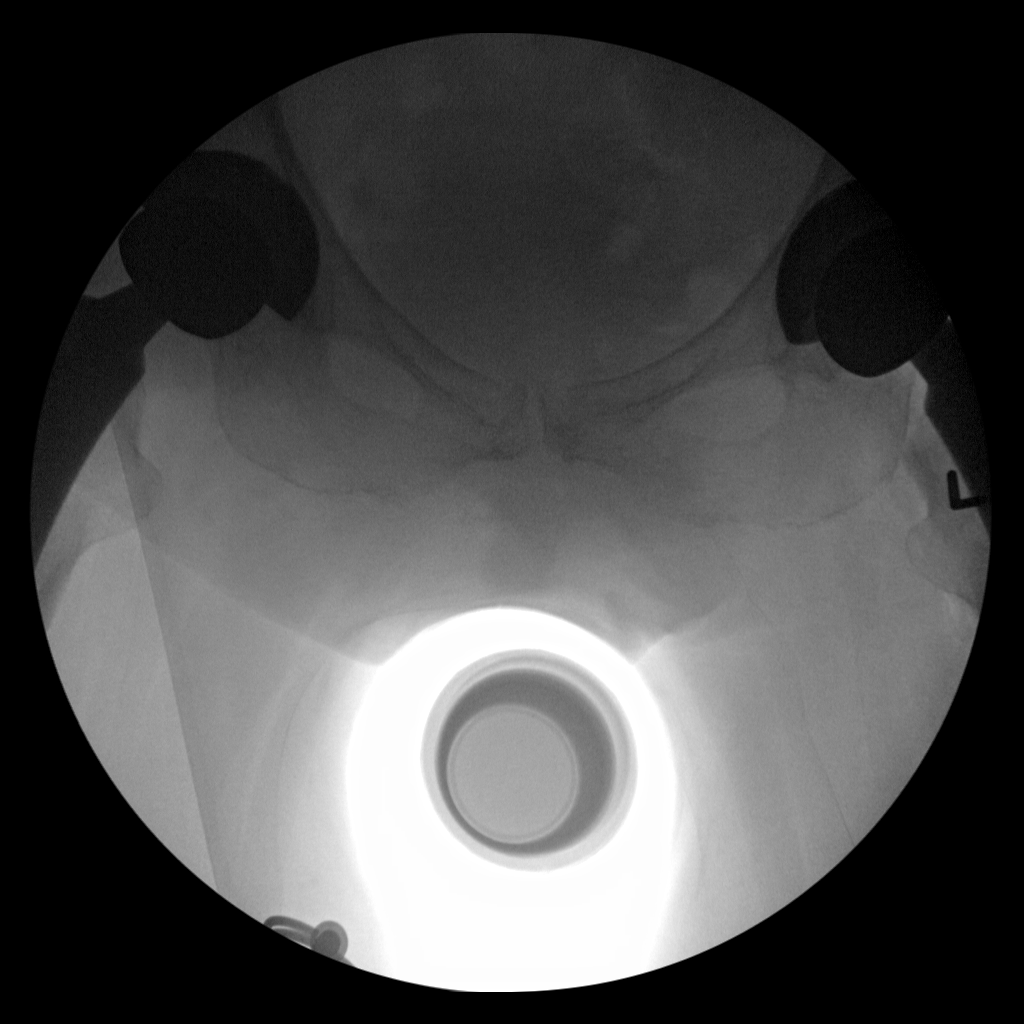

[4 of 4 positions shown; findings below may reference images not displayed]

FINDINGS: Four intraoperative C-arm views of the left hip submitted for review
after surgery. Post total left hip replacement which appears in
satisfactory position without complication noted. Acetabular aspect
with position just below left pelvic cortical margin.

Remote right hip replacement.
IMPRESSION: Post total left hip replacement.

## 2019-04-30 DIAGNOSIS — R928 Other abnormal and inconclusive findings on diagnostic imaging of breast: Secondary | ICD-10-CM | POA: Diagnosis not present

## 2019-04-30 DIAGNOSIS — N6489 Other specified disorders of breast: Secondary | ICD-10-CM | POA: Diagnosis not present

## 2019-05-07 DIAGNOSIS — L304 Erythema intertrigo: Secondary | ICD-10-CM | POA: Diagnosis not present

## 2019-05-07 DIAGNOSIS — Z6841 Body Mass Index (BMI) 40.0 and over, adult: Secondary | ICD-10-CM | POA: Diagnosis not present

## 2019-05-07 DIAGNOSIS — F5101 Primary insomnia: Secondary | ICD-10-CM | POA: Diagnosis not present

## 2019-05-07 DIAGNOSIS — R7303 Prediabetes: Secondary | ICD-10-CM | POA: Diagnosis not present

## 2019-05-07 DIAGNOSIS — I10 Essential (primary) hypertension: Secondary | ICD-10-CM | POA: Diagnosis not present

## 2019-05-17 DIAGNOSIS — Z20828 Contact with and (suspected) exposure to other viral communicable diseases: Secondary | ICD-10-CM | POA: Diagnosis not present

## 2019-05-17 DIAGNOSIS — G4710 Hypersomnia, unspecified: Secondary | ICD-10-CM | POA: Diagnosis not present

## 2019-05-17 DIAGNOSIS — Z01812 Encounter for preprocedural laboratory examination: Secondary | ICD-10-CM | POA: Diagnosis not present

## 2019-05-24 DIAGNOSIS — G4733 Obstructive sleep apnea (adult) (pediatric): Secondary | ICD-10-CM | POA: Diagnosis not present

## 2019-06-08 DIAGNOSIS — M797 Fibromyalgia: Secondary | ICD-10-CM | POA: Diagnosis not present

## 2019-06-08 DIAGNOSIS — M5442 Lumbago with sciatica, left side: Secondary | ICD-10-CM | POA: Diagnosis not present

## 2019-06-08 DIAGNOSIS — M5441 Lumbago with sciatica, right side: Secondary | ICD-10-CM | POA: Diagnosis not present

## 2019-06-08 DIAGNOSIS — G4733 Obstructive sleep apnea (adult) (pediatric): Secondary | ICD-10-CM | POA: Diagnosis not present

## 2019-06-08 DIAGNOSIS — D472 Monoclonal gammopathy: Secondary | ICD-10-CM | POA: Diagnosis not present

## 2019-06-08 DIAGNOSIS — G609 Hereditary and idiopathic neuropathy, unspecified: Secondary | ICD-10-CM | POA: Diagnosis not present

## 2019-06-10 DIAGNOSIS — D472 Monoclonal gammopathy: Secondary | ICD-10-CM | POA: Diagnosis not present

## 2019-06-10 DIAGNOSIS — G609 Hereditary and idiopathic neuropathy, unspecified: Secondary | ICD-10-CM | POA: Diagnosis not present

## 2019-06-10 DIAGNOSIS — M797 Fibromyalgia: Secondary | ICD-10-CM | POA: Diagnosis not present

## 2019-06-10 DIAGNOSIS — G2581 Restless legs syndrome: Secondary | ICD-10-CM | POA: Diagnosis not present

## 2019-06-10 DIAGNOSIS — G4733 Obstructive sleep apnea (adult) (pediatric): Secondary | ICD-10-CM | POA: Diagnosis not present

## 2019-06-10 DIAGNOSIS — G4710 Hypersomnia, unspecified: Secondary | ICD-10-CM | POA: Diagnosis not present

## 2019-06-29 DIAGNOSIS — Z01812 Encounter for preprocedural laboratory examination: Secondary | ICD-10-CM | POA: Diagnosis not present

## 2019-06-29 DIAGNOSIS — Z20828 Contact with and (suspected) exposure to other viral communicable diseases: Secondary | ICD-10-CM | POA: Diagnosis not present

## 2019-06-29 DIAGNOSIS — G4733 Obstructive sleep apnea (adult) (pediatric): Secondary | ICD-10-CM | POA: Diagnosis not present

## 2019-07-06 DIAGNOSIS — G4733 Obstructive sleep apnea (adult) (pediatric): Secondary | ICD-10-CM | POA: Diagnosis not present

## 2019-08-06 DIAGNOSIS — I10 Essential (primary) hypertension: Secondary | ICD-10-CM | POA: Diagnosis not present

## 2019-08-06 DIAGNOSIS — F5101 Primary insomnia: Secondary | ICD-10-CM | POA: Diagnosis not present

## 2019-08-06 DIAGNOSIS — R7303 Prediabetes: Secondary | ICD-10-CM | POA: Diagnosis not present

## 2019-08-06 DIAGNOSIS — G4733 Obstructive sleep apnea (adult) (pediatric): Secondary | ICD-10-CM | POA: Diagnosis not present

## 2019-08-16 DIAGNOSIS — Z961 Presence of intraocular lens: Secondary | ICD-10-CM | POA: Diagnosis not present

## 2019-08-16 DIAGNOSIS — H43813 Vitreous degeneration, bilateral: Secondary | ICD-10-CM | POA: Diagnosis not present

## 2019-08-16 DIAGNOSIS — H16223 Keratoconjunctivitis sicca, not specified as Sjogren's, bilateral: Secondary | ICD-10-CM | POA: Diagnosis not present

## 2019-08-16 DIAGNOSIS — H52203 Unspecified astigmatism, bilateral: Secondary | ICD-10-CM | POA: Diagnosis not present

## 2019-08-16 DIAGNOSIS — H0100B Unspecified blepharitis left eye, upper and lower eyelids: Secondary | ICD-10-CM | POA: Diagnosis not present

## 2019-08-16 DIAGNOSIS — H0100A Unspecified blepharitis right eye, upper and lower eyelids: Secondary | ICD-10-CM | POA: Diagnosis not present

## 2019-08-16 DIAGNOSIS — H26492 Other secondary cataract, left eye: Secondary | ICD-10-CM | POA: Diagnosis not present

## 2019-08-16 DIAGNOSIS — R7303 Prediabetes: Secondary | ICD-10-CM | POA: Diagnosis not present

## 2019-08-16 DIAGNOSIS — H5203 Hypermetropia, bilateral: Secondary | ICD-10-CM | POA: Diagnosis not present

## 2019-08-16 DIAGNOSIS — H524 Presbyopia: Secondary | ICD-10-CM | POA: Diagnosis not present

## 2019-08-25 DIAGNOSIS — G4733 Obstructive sleep apnea (adult) (pediatric): Secondary | ICD-10-CM | POA: Diagnosis not present

## 2019-08-31 DIAGNOSIS — G4733 Obstructive sleep apnea (adult) (pediatric): Secondary | ICD-10-CM | POA: Diagnosis not present

## 2019-09-18 DIAGNOSIS — S91139A Puncture wound without foreign body of unspecified toe(s) without damage to nail, initial encounter: Secondary | ICD-10-CM | POA: Diagnosis not present

## 2019-10-01 DIAGNOSIS — G4733 Obstructive sleep apnea (adult) (pediatric): Secondary | ICD-10-CM | POA: Diagnosis not present

## 2019-10-12 DIAGNOSIS — Z9989 Dependence on other enabling machines and devices: Secondary | ICD-10-CM | POA: Diagnosis not present

## 2019-10-12 DIAGNOSIS — G4733 Obstructive sleep apnea (adult) (pediatric): Secondary | ICD-10-CM | POA: Diagnosis not present

## 2019-10-15 DIAGNOSIS — M1712 Unilateral primary osteoarthritis, left knee: Secondary | ICD-10-CM | POA: Diagnosis not present

## 2019-10-19 DIAGNOSIS — R194 Change in bowel habit: Secondary | ICD-10-CM | POA: Diagnosis not present

## 2019-10-19 DIAGNOSIS — G44209 Tension-type headache, unspecified, not intractable: Secondary | ICD-10-CM | POA: Diagnosis not present

## 2019-10-19 DIAGNOSIS — R21 Rash and other nonspecific skin eruption: Secondary | ICD-10-CM | POA: Diagnosis not present

## 2019-10-31 DIAGNOSIS — G4733 Obstructive sleep apnea (adult) (pediatric): Secondary | ICD-10-CM | POA: Diagnosis not present

## 2019-11-12 DIAGNOSIS — I10 Essential (primary) hypertension: Secondary | ICD-10-CM | POA: Diagnosis not present

## 2019-11-12 DIAGNOSIS — E782 Mixed hyperlipidemia: Secondary | ICD-10-CM | POA: Diagnosis not present

## 2019-11-12 DIAGNOSIS — M170 Bilateral primary osteoarthritis of knee: Secondary | ICD-10-CM | POA: Diagnosis not present

## 2019-11-12 DIAGNOSIS — R0602 Shortness of breath: Secondary | ICD-10-CM | POA: Diagnosis not present

## 2019-11-12 DIAGNOSIS — R7303 Prediabetes: Secondary | ICD-10-CM | POA: Diagnosis not present

## 2019-11-12 DIAGNOSIS — F330 Major depressive disorder, recurrent, mild: Secondary | ICD-10-CM | POA: Diagnosis not present

## 2019-11-12 DIAGNOSIS — Z0000 Encounter for general adult medical examination without abnormal findings: Secondary | ICD-10-CM | POA: Diagnosis not present

## 2019-11-16 DIAGNOSIS — G4733 Obstructive sleep apnea (adult) (pediatric): Secondary | ICD-10-CM | POA: Diagnosis not present

## 2019-11-16 DIAGNOSIS — Z9989 Dependence on other enabling machines and devices: Secondary | ICD-10-CM | POA: Diagnosis not present

## 2019-11-29 DIAGNOSIS — G4733 Obstructive sleep apnea (adult) (pediatric): Secondary | ICD-10-CM | POA: Diagnosis not present

## 2019-12-03 DIAGNOSIS — G4710 Hypersomnia, unspecified: Secondary | ICD-10-CM | POA: Diagnosis not present

## 2019-12-03 DIAGNOSIS — G4733 Obstructive sleep apnea (adult) (pediatric): Secondary | ICD-10-CM | POA: Diagnosis not present

## 2019-12-03 DIAGNOSIS — M797 Fibromyalgia: Secondary | ICD-10-CM | POA: Diagnosis not present

## 2019-12-03 DIAGNOSIS — F330 Major depressive disorder, recurrent, mild: Secondary | ICD-10-CM | POA: Diagnosis not present

## 2019-12-03 DIAGNOSIS — R400 Somnolence: Secondary | ICD-10-CM | POA: Diagnosis not present

## 2019-12-03 DIAGNOSIS — E782 Mixed hyperlipidemia: Secondary | ICD-10-CM | POA: Diagnosis not present

## 2019-12-30 DIAGNOSIS — G4733 Obstructive sleep apnea (adult) (pediatric): Secondary | ICD-10-CM | POA: Diagnosis not present

## 2020-01-29 DIAGNOSIS — G4733 Obstructive sleep apnea (adult) (pediatric): Secondary | ICD-10-CM | POA: Diagnosis not present

## 2020-02-15 DIAGNOSIS — K3189 Other diseases of stomach and duodenum: Secondary | ICD-10-CM | POA: Diagnosis not present

## 2020-02-15 DIAGNOSIS — R159 Full incontinence of feces: Secondary | ICD-10-CM | POA: Diagnosis not present

## 2020-02-15 DIAGNOSIS — Z86010 Personal history of colonic polyps: Secondary | ICD-10-CM | POA: Diagnosis not present

## 2020-02-15 DIAGNOSIS — Z800 Family history of malignant neoplasm of digestive organs: Secondary | ICD-10-CM | POA: Diagnosis not present

## 2020-02-21 DIAGNOSIS — M25512 Pain in left shoulder: Secondary | ICD-10-CM | POA: Diagnosis not present

## 2020-02-29 DIAGNOSIS — G4733 Obstructive sleep apnea (adult) (pediatric): Secondary | ICD-10-CM | POA: Diagnosis not present

## 2020-03-03 DIAGNOSIS — R400 Somnolence: Secondary | ICD-10-CM | POA: Diagnosis not present

## 2020-03-03 DIAGNOSIS — G4733 Obstructive sleep apnea (adult) (pediatric): Secondary | ICD-10-CM | POA: Diagnosis not present

## 2020-03-03 DIAGNOSIS — E782 Mixed hyperlipidemia: Secondary | ICD-10-CM | POA: Diagnosis not present

## 2020-03-03 DIAGNOSIS — I10 Essential (primary) hypertension: Secondary | ICD-10-CM | POA: Diagnosis not present

## 2020-03-03 DIAGNOSIS — F330 Major depressive disorder, recurrent, mild: Secondary | ICD-10-CM | POA: Diagnosis not present

## 2020-03-03 DIAGNOSIS — K580 Irritable bowel syndrome with diarrhea: Secondary | ICD-10-CM | POA: Diagnosis not present

## 2020-03-13 DIAGNOSIS — K922 Gastrointestinal hemorrhage, unspecified: Secondary | ICD-10-CM | POA: Diagnosis not present

## 2020-03-13 DIAGNOSIS — D500 Iron deficiency anemia secondary to blood loss (chronic): Secondary | ICD-10-CM | POA: Diagnosis not present

## 2020-03-13 DIAGNOSIS — D472 Monoclonal gammopathy: Secondary | ICD-10-CM | POA: Diagnosis not present

## 2020-03-15 DIAGNOSIS — G4733 Obstructive sleep apnea (adult) (pediatric): Secondary | ICD-10-CM | POA: Diagnosis not present

## 2020-03-27 DIAGNOSIS — R270 Ataxia, unspecified: Secondary | ICD-10-CM | POA: Diagnosis not present

## 2020-03-27 DIAGNOSIS — G4733 Obstructive sleep apnea (adult) (pediatric): Secondary | ICD-10-CM | POA: Diagnosis not present

## 2020-03-27 DIAGNOSIS — G609 Hereditary and idiopathic neuropathy, unspecified: Secondary | ICD-10-CM | POA: Diagnosis not present

## 2020-03-27 DIAGNOSIS — M797 Fibromyalgia: Secondary | ICD-10-CM | POA: Diagnosis not present

## 2020-03-30 DIAGNOSIS — G4733 Obstructive sleep apnea (adult) (pediatric): Secondary | ICD-10-CM | POA: Diagnosis not present

## 2020-04-26 DIAGNOSIS — Z20828 Contact with and (suspected) exposure to other viral communicable diseases: Secondary | ICD-10-CM | POA: Diagnosis not present

## 2020-04-28 DIAGNOSIS — U071 COVID-19: Secondary | ICD-10-CM | POA: Diagnosis not present

## 2020-04-30 DIAGNOSIS — G4733 Obstructive sleep apnea (adult) (pediatric): Secondary | ICD-10-CM | POA: Diagnosis not present

## 2020-05-15 DIAGNOSIS — M7061 Trochanteric bursitis, right hip: Secondary | ICD-10-CM | POA: Diagnosis not present

## 2020-05-22 ENCOUNTER — Ambulatory Visit: Payer: PPO | Admitting: Licensed Clinical Social Worker

## 2020-05-22 ENCOUNTER — Other Ambulatory Visit: Payer: Self-pay

## 2020-05-22 DIAGNOSIS — F321 Major depressive disorder, single episode, moderate: Secondary | ICD-10-CM

## 2020-05-28 NOTE — Progress Notes (Signed)
Error Evelyn South, LCSW

## 2020-05-28 NOTE — Progress Notes (Signed)
° °  THERAPIST PROGRESS NOTE  Session Time: 47min  Participation Level: Active  Behavioral Response: Casual and Well GroomedAlertDepressed  Type of Therapy: Individual Therapy  Treatment Goals addressed: Coping  Interventions: CBT, Motivational Interviewing and Solution Focused  Summary: Evelyn Valdez is a 79 y.o. female who presents with symptoms of her diagnosis.  Patient was open and responsive throughout the video session.  Patient was tearful and sad at various moments of the session.  She was able to give a timeline of her life to include marriage and children.  Patient states that her depression many years ago.  She reports that her husband is mean and that they do not have a loving relationship. She reports that his family was mean and inconsiderate toward her from the beginning.  She wants to "find herself" outside of her marriage.   Suicidal/Homicidal: No  Therapist Response: Therapist actively listening and asked open ended questions throughout the session.  Therapist educated Patient on her diagnosis and the symptoms that she exhibits. Therapist gave homework (journaling and doing something different each day and recording how she feels).  Plan: Return again in 1 weeks.  Has an appointment with a [facility] on Jun 13 2020; will transition at that time.  Diagnosis: Axis I: Depression    Axis II: No diagnosis    Lubertha South, LCSW 05/28/2020

## 2020-05-29 ENCOUNTER — Ambulatory Visit: Payer: PPO | Admitting: Licensed Clinical Social Worker

## 2020-05-29 ENCOUNTER — Other Ambulatory Visit: Payer: Self-pay

## 2020-05-30 ENCOUNTER — Ambulatory Visit (INDEPENDENT_AMBULATORY_CARE_PROVIDER_SITE_OTHER): Payer: PPO | Admitting: Licensed Clinical Social Worker

## 2020-05-30 ENCOUNTER — Ambulatory Visit: Payer: PPO | Admitting: Licensed Clinical Social Worker

## 2020-05-30 DIAGNOSIS — F321 Major depressive disorder, single episode, moderate: Secondary | ICD-10-CM

## 2020-05-31 DIAGNOSIS — G4733 Obstructive sleep apnea (adult) (pediatric): Secondary | ICD-10-CM | POA: Diagnosis not present

## 2020-06-06 ENCOUNTER — Other Ambulatory Visit: Payer: Self-pay

## 2020-06-06 ENCOUNTER — Ambulatory Visit: Payer: PPO | Admitting: Licensed Clinical Social Worker

## 2020-06-06 DIAGNOSIS — F321 Major depressive disorder, single episode, moderate: Secondary | ICD-10-CM

## 2020-06-09 DIAGNOSIS — I10 Essential (primary) hypertension: Secondary | ICD-10-CM | POA: Diagnosis not present

## 2020-06-09 DIAGNOSIS — G4733 Obstructive sleep apnea (adult) (pediatric): Secondary | ICD-10-CM | POA: Diagnosis not present

## 2020-06-09 DIAGNOSIS — G4710 Hypersomnia, unspecified: Secondary | ICD-10-CM | POA: Diagnosis not present

## 2020-06-09 DIAGNOSIS — R400 Somnolence: Secondary | ICD-10-CM | POA: Diagnosis not present

## 2020-06-09 DIAGNOSIS — F330 Major depressive disorder, recurrent, mild: Secondary | ICD-10-CM | POA: Diagnosis not present

## 2020-06-09 DIAGNOSIS — U099 Post covid-19 condition, unspecified: Secondary | ICD-10-CM | POA: Diagnosis not present

## 2020-06-12 NOTE — Progress Notes (Signed)
   THERAPIST PROGRESS NOTE  No session on this day.  Internet outage. Will attempt to contact patient later today for session if available.  Patient has upconing appointment with Therapist on 06/13/2020   Lubertha South, LCSW 06/06/2020

## 2020-06-12 NOTE — Progress Notes (Signed)
° °  THERAPIST PROGRESS NOTE  Session Time: 56min  Participation Level: Active  Behavioral Response: Casual and NeatAlertAnxious  Type of Therapy: Individual Therapy  Treatment Goals addressed: Coping  Interventions: CBT and Motivational Interviewing  Summary: Evelyn Valdez is a 79 y.o. female who presents with continued symptoms of her diagnosis.  Factors that contribute to client's ongoing depressive symptoms were discussed and include real and perceived feelings of isolation, criticism, rejection, shame and guilt.  Patient discussed her marriage and described the lack of intimacy.  She reports her frustration and how it adds to her depression. Encouraged Patient to work on herself (self esteem, reduction of depressive symptoms). Gave homework to journal and try something new everyday.  Suicidal/Homicidal: No  Plan: Return again in 1 weeks; reminded patient that next session is the last session with the Writer  Diagnosis: Axis I: Depression    Axis II: No diagnosis    Lubertha South, LCSW 06/02/2020

## 2020-06-13 ENCOUNTER — Ambulatory Visit (INDEPENDENT_AMBULATORY_CARE_PROVIDER_SITE_OTHER): Payer: PPO | Admitting: Licensed Clinical Social Worker

## 2020-06-13 ENCOUNTER — Other Ambulatory Visit: Payer: Self-pay

## 2020-06-13 DIAGNOSIS — F321 Major depressive disorder, single episode, moderate: Secondary | ICD-10-CM

## 2020-06-13 NOTE — Progress Notes (Signed)
Virtual Visit via Video Note  I connected with Evelyn Valdez on 06/13/20 at 11:00 AM EDT by a video enabled telemedicine application and verified that I am speaking with the correct person using two identifiers.  Location: Patient: home Provider: [facility]   I discussed the limitations of evaluation and management by telemedicine and the availability of in person appointments. The patient expressed understanding and agreed to proceed.   I discussed the assessment and treatment plan with the patient. The patient was provided an opportunity to ask questions and all were answered. The patient agreed with the plan and demonstrated an understanding of the instructions.   The patient was advised to call back or seek an in-person evaluation if the symptoms worsen or if the condition fails to improve as anticipated.  I provided 50 minutes of non-face-to-face time during this encounter.   Gyan Cambre R Epifania Littrell, LCSW    THERAPIST PROGRESS NOTE  Session Time: 11:10-12:00p  Participation Level: Active  Behavioral Response: NeatAlertDepressed  Type of Therapy: Individual Therapy  Treatment Goals addressed: Coping  Interventions: Solution Focused, Strength-based and Supportive  Summary: Evelyn Valdez is a 79 y.o. female who presents with symptoms related to depression diagnosis.    Allowed pt to explore and express thoughts and feelings associated with current external stressors and family relationships.  Allowed pt to explore and express timeline of family relationships. Very strong faith-base. Discussed pros/cons of current marriage.   Discussed communication skills, setting and adhering to boundaries, compromises, and quality-of-life activities.   Encouraged pt to focus on self care and life balance. Discussed positive memories and positive guided imagery.   Suicidal/Homicidal: No  Therapist Response: Developed treatment plan.   Plan: Return again in 3 weeks.The ongoing  treatment plan includes maintaining current levels of progress and continuing to build skills to manage mood, improve stress/anxiety management, emotion regulation, distress tolerance, and behavior modification.   Diagnosis: Axis I: Major depressive disorder, recurrent, moderate    Axis II: No diagnosis    [place], LCSW 06/13/2020

## 2020-06-29 ENCOUNTER — Other Ambulatory Visit: Payer: Self-pay

## 2020-06-29 ENCOUNTER — Ambulatory Visit (INDEPENDENT_AMBULATORY_CARE_PROVIDER_SITE_OTHER): Payer: PPO | Admitting: Licensed Clinical Social Worker

## 2020-06-29 DIAGNOSIS — F321 Major depressive disorder, single episode, moderate: Secondary | ICD-10-CM

## 2020-06-29 NOTE — Progress Notes (Signed)
Virtual Visit via Video Note  I connected with Evelyn Valdez on 06/29/20 at 12:30 PM EDT by a video enabled telemedicine application and verified that I am speaking with the correct person using two identifiers.  Location: Patient: home Provider: [facility]   I discussed the limitations of evaluation and management by telemedicine and the availability of in person appointments. The patient expressed understanding and agreed to proceed.   The patient was advised to call back or seek an in-person evaluation if the symptoms worsen or if the condition fails to improve as anticipated.  I provided 45 minutes of non-face-to-face time during this encounter.   [place], LCSW    THERAPIST PROGRESS NOTE  Session Time: 12:30-1:15p  Participation Level: Active  Behavioral Response: Neat and Well GroomedAlertAnxious  Type of Therapy: Individual Therapy  Treatment Goals addressed: Coping  Interventions: Supportive  Summary: Evelyn Valdez is a 79 y.o. female who presents with continuing symptoms associated with depression. Pt reports that she is compliant with medication and that it is managing symptoms well.   Allowed pt to explore and express thoughts and feelings associated with relationships: church relationships and marital relationship.   Discussed psychological impact of weight loss and recent gains. Discussed concept of radical acceptance.   Encouraged pt to focus on positive relationships, self care, and life balance.  Suicidal/Homicidal: No  SI, HI, or AVH reported at time of session.  Therapist Response: Czarina continues to make good progress with self understanding and insight. Treatment continuing to show good evolution and development.   Plan: Return again in 3 weeks.The ongoing treatment plan includes maintaining current levels of progress and continuing to build skills to manage mood, improve stress/anxiety management, emotion regulation, distress  tolerance, and behavior modification.   Diagnosis: Axis I: Current episode of Major Depressive Disorder    Axis II: No diagnosis    Rachel Bo Marquese Burkland, LCSW 06/29/2020

## 2020-06-30 DIAGNOSIS — G4733 Obstructive sleep apnea (adult) (pediatric): Secondary | ICD-10-CM | POA: Diagnosis not present

## 2020-07-10 DIAGNOSIS — F411 Generalized anxiety disorder: Secondary | ICD-10-CM | POA: Diagnosis not present

## 2020-07-10 DIAGNOSIS — L281 Prurigo nodularis: Secondary | ICD-10-CM | POA: Diagnosis not present

## 2020-07-10 DIAGNOSIS — M461 Sacroiliitis, not elsewhere classified: Secondary | ICD-10-CM | POA: Diagnosis not present

## 2020-07-10 DIAGNOSIS — R7303 Prediabetes: Secondary | ICD-10-CM | POA: Diagnosis not present

## 2020-07-20 ENCOUNTER — Other Ambulatory Visit: Payer: Self-pay

## 2020-07-20 ENCOUNTER — Ambulatory Visit (INDEPENDENT_AMBULATORY_CARE_PROVIDER_SITE_OTHER): Payer: PPO | Admitting: Licensed Clinical Social Worker

## 2020-07-20 DIAGNOSIS — F321 Major depressive disorder, single episode, moderate: Secondary | ICD-10-CM | POA: Diagnosis not present
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
## 2020-07-31 DIAGNOSIS — G4733 Obstructive sleep apnea (adult) (pediatric): Secondary | ICD-10-CM | POA: Diagnosis not present

## 2020-08-04 DIAGNOSIS — Z9889 Other specified postprocedural states: Secondary | ICD-10-CM | POA: Diagnosis not present

## 2020-08-04 DIAGNOSIS — M5136 Other intervertebral disc degeneration, lumbar region: Secondary | ICD-10-CM | POA: Diagnosis not present

## 2020-08-04 DIAGNOSIS — M5441 Lumbago with sciatica, right side: Secondary | ICD-10-CM | POA: Diagnosis not present

## 2020-08-04 DIAGNOSIS — M5442 Lumbago with sciatica, left side: Secondary | ICD-10-CM | POA: Diagnosis not present

## 2020-08-04 DIAGNOSIS — G8929 Other chronic pain: Secondary | ICD-10-CM | POA: Diagnosis not present

## 2020-08-04 DIAGNOSIS — M47816 Spondylosis without myelopathy or radiculopathy, lumbar region: Secondary | ICD-10-CM | POA: Diagnosis not present

## 2020-08-07 DIAGNOSIS — K6389 Other specified diseases of intestine: Secondary | ICD-10-CM | POA: Diagnosis not present

## 2020-08-07 DIAGNOSIS — Z800 Family history of malignant neoplasm of digestive organs: Secondary | ICD-10-CM | POA: Diagnosis not present

## 2020-08-07 DIAGNOSIS — K6289 Other specified diseases of anus and rectum: Secondary | ICD-10-CM | POA: Diagnosis not present

## 2020-08-07 DIAGNOSIS — Z86010 Personal history of colonic polyps: Secondary | ICD-10-CM | POA: Diagnosis not present

## 2020-08-07 DIAGNOSIS — R159 Full incontinence of feces: Secondary | ICD-10-CM | POA: Diagnosis not present

## 2020-08-07 DIAGNOSIS — K648 Other hemorrhoids: Secondary | ICD-10-CM | POA: Diagnosis not present

## 2020-08-15 ENCOUNTER — Other Ambulatory Visit: Payer: Self-pay

## 2020-08-15 ENCOUNTER — Ambulatory Visit (INDEPENDENT_AMBULATORY_CARE_PROVIDER_SITE_OTHER): Payer: PPO | Admitting: Licensed Clinical Social Worker

## 2020-08-15 DIAGNOSIS — F321 Major depressive disorder, single episode, moderate: Secondary | ICD-10-CM

## 2020-08-15 NOTE — Progress Notes (Signed)
Virtual Visit via Video Note  I connected with Evelyn Valdez on 08/15/20 at  1:30 PM EST by a video enabled telemedicine application and verified that I am speaking with the correct person using two identifiers.  Location: Patient: home Provider: [facility]   I discussed the limitations of evaluation and management by telemedicine and the availability of in person appointments. The patient expressed understanding and agreed to proceed.  The patient was advised to call back or seek an in-person evaluation if the symptoms worsen or if the condition fails to improve as anticipated.  I provided 60 minutes of non-face-to-face time during this encounter.   Derrien Anschutz R Cristhian Vanhook, LCSW    THERAPIST PROGRESS NOTE  Session Time: 1:30-2:30p  Participation Level: Active  Behavioral Response: Neat and Well GroomedAlertAnxious  Type of Therapy: Individual Therapy  Treatment Goals addressed: Anxiety and Coping  Interventions: Solution Focused, Supportive and Other: trauma focused  Summary: Evelyn Valdez is a 79 y.o. female who presents with improving symptoms related to depression diagnosis. Pt reports that mood is stable and that she is getting good quality and quantity of sleep.    Allowed pt to explore and express thoughts and feelings associated with current life circumstances--discussed relationship with husband, continuing spiritual journey, relationships with sisters, and trauma from the past.  Pt explored some trauma that she has experienced through the years and how it has triggered some pent-up resentment towards others. Pt has strong belief in [place], so forgiveness is something that she feels strongly about. Pt feels that other people may be judgemental about her faith but she feels very proud of who she is as a person, and what she believes in.    Allowed pt to identify areas of personal growth throughout the years, and specifically in 2021 since pandemic. Encouraged  pt to continue focusing on areas that make her feel good about herself, increasing spiritual growth, self care, positive social supports, and finding peace within self.  Suicidal/Homicidal: No  Therapist Response: [name] continues to make good progress with family and relational functioning. Significant gains in self assertion and positive self promotion are evident. Treatment showing good evolution and development.  Plan: Return again in 6 weeks. Revised treatment plan.  Diagnosis: Axis I: Depressive Disorder     Axis II: No diagnosis    Rachel Bo Symia Herdt, LCSW 08/15/2020

## 2020-08-30 DIAGNOSIS — G4733 Obstructive sleep apnea (adult) (pediatric): Secondary | ICD-10-CM | POA: Diagnosis not present

## 2020-09-20 ENCOUNTER — Ambulatory Visit (INDEPENDENT_AMBULATORY_CARE_PROVIDER_SITE_OTHER): Payer: PPO | Admitting: Licensed Clinical Social Worker

## 2020-09-20 ENCOUNTER — Other Ambulatory Visit: Payer: Self-pay

## 2020-09-20 DIAGNOSIS — F321 Major depressive disorder, single episode, moderate: Secondary | ICD-10-CM | POA: Diagnosis not present

## 2020-09-20 NOTE — Progress Notes (Signed)
Virtual Visit via Video Note  I connected with Evelyn Valdez on 09/20/20 at  1:00 PM EST by a video enabled telemedicine application and verified that I am speaking with the correct person using two identifiers.  Location: Patient: home Provider: remote office [place], [place])   I discussed the limitations of evaluation and management by telemedicine and the availability of in person appointments. The patient expressed understanding and agreed to proceed.   The patient was advised to call back or seek an in-person evaluation if the symptoms worsen or if the condition fails to improve as anticipated.  I provided 60 minutes of non-face-to-face time during this encounter.   Aviance [place]wood R Aliyyah Riese, LCSW    THERAPIST PROGRESS NOTE  Session Time: 1-2p  Participation Level: Active  Behavioral Response: Neat and Well GroomedAlertAnxious and Depressed  Type of Therapy: Individual Therapy  Treatment Goals addressed: Anxiety and Coping  Interventions: Solution Focused, Supportive and Other: trauma focused  Summary: Evelyn Valdez is a 80 y.o. female who presents with consistent symptoms related to depression diagnosis. Pt reports that her mood is stable and that she is managing stress and anxiety well.  Allowed pt to explore and express thoughts and feelings triggered by a recent trip to [facility] to visit sisters and niece Verdene Lennert). Pt reports that she noticed some differences in her own morals/values and the morals/values of her sisters and what they tolerate as "ok" with their children. Pt reports that she was borderline offended by the casual use of profanity--goes against pts religious background. Pt reports that visiting with her sisters is usually such an emotionally freeing experience--pt very disappointed in the outcome this time.  Pt explored feelings triggered by return home--pt is reflecting on entire marriage, having a child before marriage, and facing judgements of others.  Pt also admits that she has been the one to judge others at times. Gave pt unconditional support and acceptance and encouraged pt to focus on the positive things that she has accomplished for herself and to see how she has faced so many obstacles and traumas in her past. Pt identified multiple traumatic events from the past and overall psychological impact on adulthood relationships and current relationships.  Encouraged pt to continue focusing on self care, positive social support, spiritual growth, and life balance.   Suicidal/Homicidal: No  Therapist Response: Evelyn Valdez is continuing to develop family communication and relationship skills, and is continuing to make steady gains in self esteem and confidence in self and decision making skills. Evelyn Valdez is assertive with her communication and is confident about expressing her wants/needs to others. Evelyn Valdez is also setting and abiding by previously set personal boundaries with others. Treatment showing good evolution and development. Continuing progress noted.   Plan: Return again in 4 weeks.  Diagnosis: Axis I: Major Depression, Recurrent     Axis II: No diagnosis    Rachel Bo Annastyn Silvey, LCSW 09/20/2020

## 2020-09-25 DIAGNOSIS — M5441 Lumbago with sciatica, right side: Secondary | ICD-10-CM | POA: Diagnosis not present

## 2020-09-25 DIAGNOSIS — M5442 Lumbago with sciatica, left side: Secondary | ICD-10-CM | POA: Diagnosis not present

## 2020-09-25 DIAGNOSIS — M797 Fibromyalgia: Secondary | ICD-10-CM | POA: Diagnosis not present

## 2020-09-25 DIAGNOSIS — R200 Anesthesia of skin: Secondary | ICD-10-CM | POA: Diagnosis not present

## 2020-09-25 DIAGNOSIS — G2581 Restless legs syndrome: Secondary | ICD-10-CM | POA: Diagnosis not present

## 2020-09-25 DIAGNOSIS — R270 Ataxia, unspecified: Secondary | ICD-10-CM | POA: Diagnosis not present

## 2020-09-25 DIAGNOSIS — G609 Hereditary and idiopathic neuropathy, unspecified: Secondary | ICD-10-CM | POA: Diagnosis not present

## 2020-10-10 DIAGNOSIS — F330 Major depressive disorder, recurrent, mild: Secondary | ICD-10-CM | POA: Diagnosis not present

## 2020-10-10 DIAGNOSIS — M5416 Radiculopathy, lumbar region: Secondary | ICD-10-CM | POA: Diagnosis not present

## 2020-10-10 DIAGNOSIS — S29012D Strain of muscle and tendon of back wall of thorax, subsequent encounter: Secondary | ICD-10-CM | POA: Diagnosis not present

## 2020-10-10 DIAGNOSIS — G8929 Other chronic pain: Secondary | ICD-10-CM | POA: Diagnosis not present

## 2020-10-10 DIAGNOSIS — G4733 Obstructive sleep apnea (adult) (pediatric): Secondary | ICD-10-CM | POA: Diagnosis not present

## 2020-10-10 DIAGNOSIS — F411 Generalized anxiety disorder: Secondary | ICD-10-CM | POA: Diagnosis not present

## 2020-10-10 DIAGNOSIS — M545 Low back pain, unspecified: Secondary | ICD-10-CM | POA: Diagnosis not present

## 2020-10-17 ENCOUNTER — Other Ambulatory Visit: Payer: Self-pay

## 2020-10-17 ENCOUNTER — Ambulatory Visit (INDEPENDENT_AMBULATORY_CARE_PROVIDER_SITE_OTHER): Payer: PPO | Admitting: Licensed Clinical Social Worker

## 2020-10-17 DIAGNOSIS — F321 Major depressive disorder, single episode, moderate: Secondary | ICD-10-CM

## 2020-10-17 NOTE — Progress Notes (Signed)
Virtual Visit via Video Note  I connected with Evelyn Valdez on 10/17/20 at  2:00 PM EST by a video enabled telemedicine application and verified that I am speaking with the correct person using two identifiers.  Location: Patient: home Provider: [facility]   I discussed the limitations of evaluation and management by telemedicine and the availability of in person appointments. The patient expressed understanding and agreed to proceed.  I discussed the assessment and treatment plan with the patient. The patient was provided an opportunity to ask questions and all were answered. The patient agreed with the plan and demonstrated an understanding of the instructions.   The patient was advised to call back or seek an in-person evaluation if the symptoms worsen or if the condition fails to improve as anticipated.  I provided 60 minutes of non-face-to-face time during this encounter.   Evelyn Vanatta R Chivon Lepage, LCSW    THERAPIST PROGRESS NOTE  Session Time: 2-3p  Participation Level: Active  Behavioral Response: NeatAlertAnxious  Type of Therapy: Individual Therapy  Treatment Goals addressed:   Interventions: Supportive  Summary: Evelyn Valdez is a 80 y.o. female who presents with improving symptoms related to her depression diagnosis. "I feel happy, and I haven't felt that in a really long time".  Pt reports that her overall mood has been stable and that she is managing stress and anxiety well.  Pt reports good quality and quantity of sleep.  Allowed pt to explore and express thoughts and feelings associated with past trauma-and overall psychological impact. Pt discussed relationships with her children and decisions that they are continuing to make that are directly impacting her. Allowed pt to weigh out pros and cons of setting boundaries/limits with her children. Pt states that she knows that her children will get mad if she sets financial boundaries with them but she's willing to  make the choice knowing that this could be the consequence.   Pt discussed husband--recently had surgery. Discussed the aging process and how it has impacted overall relationship.  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   Encouraged overall self care, positive social interactions, life balance.  Suicidal/Homicidal: No  Therapist Response: Joshlynn reporting that she feels her mood has elevated and that she is feeling more energy, engaging in more activities, and socializing with others. Pt feels more positive towards the present and future and is verbalizing positive statements about the future. Progress noted. Treatment to continue.  Plan: Return again in 3 weeks.  Diagnosis: Axis I: Major Depression, moderate    Axis II: No diagnosis    [place], LCSW 10/17/2020

## 2020-10-24 DIAGNOSIS — M170 Bilateral primary osteoarthritis of knee: Secondary | ICD-10-CM | POA: Diagnosis not present

## 2020-10-24 DIAGNOSIS — M1712 Unilateral primary osteoarthritis, left knee: Secondary | ICD-10-CM | POA: Diagnosis not present

## 2020-10-24 DIAGNOSIS — M7051 Other bursitis of knee, right knee: Secondary | ICD-10-CM | POA: Diagnosis not present

## 2020-10-24 DIAGNOSIS — M7052 Other bursitis of knee, left knee: Secondary | ICD-10-CM | POA: Diagnosis not present

## 2020-10-24 DIAGNOSIS — Z96641 Presence of right artificial hip joint: Secondary | ICD-10-CM | POA: Diagnosis not present

## 2020-10-25 DIAGNOSIS — G8929 Other chronic pain: Secondary | ICD-10-CM | POA: Diagnosis not present

## 2020-10-25 DIAGNOSIS — M5416 Radiculopathy, lumbar region: Secondary | ICD-10-CM | POA: Diagnosis not present

## 2020-10-25 DIAGNOSIS — M545 Low back pain, unspecified: Secondary | ICD-10-CM | POA: Diagnosis not present

## 2020-11-03 DIAGNOSIS — Z7409 Other reduced mobility: Secondary | ICD-10-CM | POA: Diagnosis not present

## 2020-11-03 DIAGNOSIS — M7052 Other bursitis of knee, left knee: Secondary | ICD-10-CM | POA: Diagnosis not present

## 2020-11-03 DIAGNOSIS — M7051 Other bursitis of knee, right knee: Secondary | ICD-10-CM | POA: Diagnosis not present

## 2020-11-07 ENCOUNTER — Ambulatory Visit (INDEPENDENT_AMBULATORY_CARE_PROVIDER_SITE_OTHER): Payer: PPO | Admitting: Licensed Clinical Social Worker

## 2020-11-07 ENCOUNTER — Other Ambulatory Visit: Payer: Self-pay

## 2020-11-07 DIAGNOSIS — M5416 Radiculopathy, lumbar region: Secondary | ICD-10-CM | POA: Diagnosis not present

## 2020-11-07 DIAGNOSIS — G8929 Other chronic pain: Secondary | ICD-10-CM | POA: Diagnosis not present

## 2020-11-07 DIAGNOSIS — F321 Major depressive disorder, single episode, moderate: Secondary | ICD-10-CM | POA: Diagnosis not present

## 2020-11-07 DIAGNOSIS — M545 Low back pain, unspecified: Secondary | ICD-10-CM | POA: Diagnosis not present

## 2020-11-07 NOTE — Progress Notes (Signed)
Virtual Visit via Video Note  I connected with Evelyn Valdez on 11/07/20 at  1:00 PM EST by a video enabled telemedicine application and verified that I am speaking with the correct person using two identifiers.  Location: Patient: home Provider: [facility]   I discussed the limitations of evaluation and management by telemedicine and the availability of in person appointments. The patient expressed understanding and agreed to proceed.   I discussed the assessment and treatment plan with the patient. The patient was provided an opportunity to ask questions and all were answered. The patient agreed with the plan and demonstrated an understanding of the instructions.   The patient was advised to call back or seek an in-person evaluation if the symptoms worsen or if the condition fails to improve as anticipated.  I provided 60 minutes of non-face-to-face time during this encounter.   Evelyn Valdez R Evelyn Vera, LCSW   THERAPIST PROGRESS NOTE  Session Time: 1-2p  Participation Level: Active  Behavioral Response: Neat and Well GroomedAlertAnxious and Irritable  Type of Therapy: Individual Therapy  Treatment Goals addressed: Anxiety and Coping  Interventions: CBT, DBT and Reframing  Summary: Evelyn Valdez is a 80 y.o. female who presents with symptoms consistent with depression.  Pt reports that mood has been fluctuating a bit since last session and that anxiety has been triggered by situational events. Pt reports good quality and quantity of sleep.   Allowed pt safe space to explore and express thoughts and feelings about marriage stress, family relationships, past trauma, and current conflict with church members.   Discussed difference between listening and hearing--for both patient and husband as part of overall couples communication skills. Discussed focusing on positives instead of negatives.  Allowed pt to explore past trauma and label thoughts and feelings associated     Suicidal/Homicidal: No  Therapist Response: Evelyn Valdez is trying hard to develop healthy interpersonal relationships that lead to alleviation and help prevent the relapse of depression symptoms. Evelyn Valdez is continuing to develop healthy cognitive patterns and beliefs about self and the world that lead to alleviation and help prevent the relapse of depression symptoms. Progressing well. Treatment to continue.  Plan: Return again in 3 weeks.  Diagnosis: Axis I: Depressive Disorder NOS    Axis II: No diagnosis    Evelyn Bo Shallyn Constancio, LCSW 11/07/2020

## 2020-11-09 DIAGNOSIS — G8929 Other chronic pain: Secondary | ICD-10-CM | POA: Diagnosis not present

## 2020-11-09 DIAGNOSIS — M4726 Other spondylosis with radiculopathy, lumbar region: Secondary | ICD-10-CM | POA: Diagnosis not present

## 2020-11-09 DIAGNOSIS — M48062 Spinal stenosis, lumbar region with neurogenic claudication: Secondary | ICD-10-CM | POA: Diagnosis not present

## 2020-11-09 DIAGNOSIS — M5416 Radiculopathy, lumbar region: Secondary | ICD-10-CM | POA: Diagnosis not present

## 2020-11-09 DIAGNOSIS — M545 Low back pain, unspecified: Secondary | ICD-10-CM | POA: Diagnosis not present

## 2020-11-13 DIAGNOSIS — M48062 Spinal stenosis, lumbar region with neurogenic claudication: Secondary | ICD-10-CM | POA: Diagnosis not present

## 2020-11-13 DIAGNOSIS — Z8249 Family history of ischemic heart disease and other diseases of the circulatory system: Secondary | ICD-10-CM | POA: Diagnosis not present

## 2020-11-13 DIAGNOSIS — Z0000 Encounter for general adult medical examination without abnormal findings: Secondary | ICD-10-CM | POA: Diagnosis not present

## 2020-11-13 DIAGNOSIS — Z87891 Personal history of nicotine dependence: Secondary | ICD-10-CM | POA: Diagnosis not present

## 2020-11-13 DIAGNOSIS — Z0001 Encounter for general adult medical examination with abnormal findings: Secondary | ICD-10-CM | POA: Diagnosis not present

## 2020-11-13 DIAGNOSIS — G4733 Obstructive sleep apnea (adult) (pediatric): Secondary | ICD-10-CM | POA: Diagnosis not present

## 2020-11-13 DIAGNOSIS — G4710 Hypersomnia, unspecified: Secondary | ICD-10-CM | POA: Diagnosis not present

## 2020-11-13 DIAGNOSIS — Z79899 Other long term (current) drug therapy: Secondary | ICD-10-CM | POA: Diagnosis not present

## 2020-11-13 DIAGNOSIS — R52 Pain, unspecified: Secondary | ICD-10-CM | POA: Diagnosis not present

## 2020-11-13 DIAGNOSIS — I10 Essential (primary) hypertension: Secondary | ICD-10-CM | POA: Diagnosis not present

## 2020-11-13 DIAGNOSIS — R7303 Prediabetes: Secondary | ICD-10-CM | POA: Diagnosis not present

## 2020-11-13 DIAGNOSIS — Z1321 Encounter for screening for nutritional disorder: Secondary | ICD-10-CM | POA: Diagnosis not present

## 2020-11-13 DIAGNOSIS — F439 Reaction to severe stress, unspecified: Secondary | ICD-10-CM | POA: Diagnosis not present

## 2020-11-13 DIAGNOSIS — R400 Somnolence: Secondary | ICD-10-CM | POA: Diagnosis not present

## 2020-11-29 DIAGNOSIS — M4726 Other spondylosis with radiculopathy, lumbar region: Secondary | ICD-10-CM | POA: Diagnosis not present

## 2020-11-29 DIAGNOSIS — M48062 Spinal stenosis, lumbar region with neurogenic claudication: Secondary | ICD-10-CM | POA: Diagnosis not present

## 2020-11-30 ENCOUNTER — Ambulatory Visit: Payer: PPO | Admitting: Licensed Clinical Social Worker

## 2020-12-07 ENCOUNTER — Ambulatory Visit (INDEPENDENT_AMBULATORY_CARE_PROVIDER_SITE_OTHER): Payer: PPO | Admitting: Licensed Clinical Social Worker

## 2020-12-07 ENCOUNTER — Other Ambulatory Visit: Payer: Self-pay

## 2020-12-07 DIAGNOSIS — F321 Major depressive disorder, single episode, moderate: Secondary | ICD-10-CM | POA: Diagnosis not present

## 2020-12-07 NOTE — Progress Notes (Signed)
Virtual Visit via Video Note  I connected with Evelyn Valdez on 12/07/20 at 10:00 AM EDT by a video enabled telemedicine application and verified that I am speaking with the correct person using two identifiers.  Location: Patient: home Provider: remote office [place], [place])   I discussed the limitations of evaluation and management by telemedicine and the availability of in person appointments. The patient expressed understanding and agreed to proceed.  I discussed the assessment and treatment plan with the patient. The patient was provided an opportunity to ask questions and all were answered. The patient agreed with the plan and demonstrated an understanding of the instructions.   The patient was advised to call back or seek an in-person evaluation if the symptoms worsen or if the condition fails to improve as anticipated.  I provided 60 minutes of non-face-to-face time during this encounter.   Evelyn Valdez R Evelyn Fontanez, LCSW    THERAPIST PROGRESS NOTE  Session Time: 10-11a  Participation Level: Active  Behavioral Response: Neat and Well GroomedAlertAnxious  Type of Therapy: Individual Therapy  Treatment Goals addressed: Anxiety  Interventions: Strength-based and Supportive  Summary: Evelyn Valdez is a 80 y.o. female who presents with symptoms associated with depression. Pt reports that mood has been stable and that she is managing external stressors well.   Primary focus of today's session was pts experience with recent surgery. Allowed pt to reflect on experience and overall feelings that it has triggered. Discussed residual pain, and how pt has tried to control pain on her own in the past. Pt reporting that MD's are not wanting to give her pain meds. Allowed pt to discuss alternative methods she has tried of pain control.  Explored family relationships and how pt has reached out to extended family members via [employer] websites. Pt is very happy with the extended  family that she has met.  Continued recommendations are as follows: self care behaviors, positive social engagements, focusing on overall work/home/life balance, and focusing on positive physical and emotional wellness.   Suicidal/Homicidal: No  Therapist Response: Evelyn Valdez is trying hard to develop healthy interpersonal relationships that lead to alleviation and help prevent the relapse of depression symptoms. Evelyn Valdez is continuing to develop healthy cognitive patterns and beliefs about self and the world that lead to alleviation and help prevent the relapse of depression symptoms. Progressing well. Treatment to continue.   Plan: Return again in 3 weeks.  Diagnosis: Axis I: MDD-unspecified    Axis II: No diagnosis    [place], LCSW 12/07/2020

## 2020-12-13 DIAGNOSIS — Z4802 Encounter for removal of sutures: Secondary | ICD-10-CM | POA: Diagnosis not present

## 2020-12-27 DIAGNOSIS — G4733 Obstructive sleep apnea (adult) (pediatric): Secondary | ICD-10-CM | POA: Diagnosis not present

## 2020-12-28 ENCOUNTER — Ambulatory Visit (INDEPENDENT_AMBULATORY_CARE_PROVIDER_SITE_OTHER): Payer: PPO | Admitting: Licensed Clinical Social Worker

## 2020-12-28 ENCOUNTER — Other Ambulatory Visit: Payer: Self-pay

## 2020-12-28 DIAGNOSIS — F331 Major depressive disorder, recurrent, moderate: Secondary | ICD-10-CM

## 2020-12-28 DIAGNOSIS — Z4789 Encounter for other orthopedic aftercare: Secondary | ICD-10-CM | POA: Diagnosis not present

## 2020-12-28 DIAGNOSIS — Z9889 Other specified postprocedural states: Secondary | ICD-10-CM | POA: Diagnosis not present

## 2020-12-28 NOTE — Progress Notes (Signed)
Virtual Visit via Video Note  I connected with Evelyn Valdez on 12/28/20 at  9:00 AM EDT by a video enabled telemedicine application and verified that I am speaking with the correct person using two identifiers.  Location: Patient: home  Provider: [facility]   I discussed the limitations of evaluation and management by telemedicine and the availability of in person appointments. The patient expressed understanding and agreed to proceed.  I discussed the assessment and treatment plan with the patient. The patient was provided an opportunity to ask questions and all were answered. The patient agreed with the plan and demonstrated an understanding of the instructions.   The patient was advised to call back or seek an in-person evaluation if the symptoms worsen or if the condition fails to improve as anticipated.  I provided 60 minutes of non-face-to-face time during this encounter.   [place], LCSW   THERAPIST PROGRESS NOTE  Session Time: 9-10a  Participation Level: Active  Behavioral Response: NeatAlertDepressed  Type of Therapy: Individual Therapy  Treatment Goals addressed: Coping  Interventions: CBT  Summary: Evelyn Valdez is a 80 y.o. female who presents with symptoms consistent with depression. Pt reports that ever since her surgery, she is experiencing more chronic pain and this is triggering depression symptoms. Reflected on some coping skills that have been helpful for pt in the past and encouraged her to continue.   Discussed conflict within marriage--pt expressed how she feels about husband currently, and explored timeline of marriage. Discussed pts relationship with husband, and relationship with his family.   Pt states that she feels emotionally numb sometimes and that it feels good to cry. Discussed emotion regulation and expression.   Allowed pt to explore relationship with her sister and how important that relationship is to her.   Continued  recommendations are as follows: self care behaviors, positive social engagements, focusing on overall work/home/life balance, and focusing on positive physical and emotional wellness.   Suicidal/Homicidal: No  Therapist Response:  Evelyn Valdez is able to describe the signs and symptoms of depression that she is currently experiencing. Evelyn Valdez is able to verbally express understanding of the relationship between depressed mood and repression of feelings from past. Evelyn Valdez is able to make positive statements regarding self and ability to cope with stresses of life.  Plan: Return again in 4 weeks.  Diagnosis: Axis I: MDD, recurrent    Axis II: No diagnosis    Evelyn Bo [place]uana Wank, LCSW 12/28/2020

## 2021-01-24 ENCOUNTER — Ambulatory Visit (INDEPENDENT_AMBULATORY_CARE_PROVIDER_SITE_OTHER): Payer: PPO | Admitting: Licensed Clinical Social Worker

## 2021-01-24 ENCOUNTER — Other Ambulatory Visit: Payer: Self-pay

## 2021-01-24 DIAGNOSIS — F331 Major depressive disorder, recurrent, moderate: Secondary | ICD-10-CM | POA: Diagnosis not present

## 2021-01-24 NOTE — Progress Notes (Signed)
Virtual Visit via Video Note  I connected with Evelyn Valdez on 01/24/21 at 11:00 AM EDT by a video enabled telemedicine application and verified that I am speaking with the correct person using two identifiers.  Location: Patient: home Provider: [facility]   I discussed the limitations of evaluation and management by telemedicine and the availability of in person appointments. The patient expressed understanding and agreed to proceed.  I discussed the assessment and treatment plan with the patient. The patient was provided an opportunity to ask questions and all were answered. The patient agreed with the plan and demonstrated an understanding of the instructions.   The patient was advised to call back or seek an in-person evaluation if the symptoms worsen or if the condition fails to improve as anticipated.  I provided 60 minutes of non-face-to-face time during this encounter.   Cray Monnin R Hardie Veltre, LCSW   THERAPIST PROGRESS NOTE  Session Time:   Participation Level: Active  Behavioral Response: Neat and Well GroomedAlertAnxious  Type of Therapy: Individual Therapy  Treatment Goals addressed: Anxiety and Coping  Interventions: CBT and Supportive  Summary: Evelyn Valdez is a 80 y.o. female who presents with symptoms consistent with depression. Pt is continuing to recover from surgery--is in less pain than she was in before.  Pt reports that overall mood has been stable and that she is getting fair quality and quantity of sleep.  Allowed pt safe space to explore thoughts and feelings about recent stressors and life events. Pt is excited about siblings coming to stay with her for a visit June 3. Discussed managing chronic pain, stress triggers from church environment and stress triggers from husband.  Reviewed coping mechanisms/stress management techniques.  Continued recommendations are as follows: self care behaviors, positive social engagements, focusing on overall  work/home/life balance, and focusing on positive physical and emotional wellness.     Suicidal/Homicidal: No  Therapist Response: Evelyn Valdez is able to describe the signs and symptoms of depression that she is currently experiencing. Evelyn Valdez is able to verbally express understanding of the relationship between depressed mood and repression of feelings from past. Roberto is able to make positive statements regarding self and ability to cope with stresses of life.  Plan: Return again in 4 weeks.  Diagnosis: Axis I: MDD, recurrent, moderate    Axis II: No diagnosis    [place], LCSW 01/24/2021

## 2021-02-13 DIAGNOSIS — Z6841 Body Mass Index (BMI) 40.0 and over, adult: Secondary | ICD-10-CM | POA: Diagnosis not present

## 2021-02-13 DIAGNOSIS — R7303 Prediabetes: Secondary | ICD-10-CM | POA: Diagnosis not present

## 2021-02-13 DIAGNOSIS — I10 Essential (primary) hypertension: Secondary | ICD-10-CM | POA: Diagnosis not present

## 2021-02-13 DIAGNOSIS — F411 Generalized anxiety disorder: Secondary | ICD-10-CM | POA: Diagnosis not present

## 2021-02-13 DIAGNOSIS — F330 Major depressive disorder, recurrent, mild: Secondary | ICD-10-CM | POA: Diagnosis not present

## 2021-02-13 DIAGNOSIS — F5101 Primary insomnia: Secondary | ICD-10-CM | POA: Diagnosis not present

## 2021-02-22 DIAGNOSIS — M170 Bilateral primary osteoarthritis of knee: Secondary | ICD-10-CM | POA: Diagnosis not present

## 2021-02-22 DIAGNOSIS — M19072 Primary osteoarthritis, left ankle and foot: Secondary | ICD-10-CM | POA: Diagnosis not present

## 2021-02-22 DIAGNOSIS — M545 Low back pain, unspecified: Secondary | ICD-10-CM | POA: Diagnosis not present

## 2021-02-22 DIAGNOSIS — M19071 Primary osteoarthritis, right ankle and foot: Secondary | ICD-10-CM | POA: Diagnosis not present

## 2021-02-22 DIAGNOSIS — Z48811 Encounter for surgical aftercare following surgery on the nervous system: Secondary | ICD-10-CM | POA: Diagnosis not present

## 2021-02-26 ENCOUNTER — Ambulatory Visit (INDEPENDENT_AMBULATORY_CARE_PROVIDER_SITE_OTHER): Payer: PPO | Admitting: Licensed Clinical Social Worker

## 2021-02-26 ENCOUNTER — Other Ambulatory Visit: Payer: Self-pay

## 2021-02-26 DIAGNOSIS — F331 Major depressive disorder, recurrent, moderate: Secondary | ICD-10-CM | POA: Diagnosis not present

## 2021-02-26 DIAGNOSIS — J069 Acute upper respiratory infection, unspecified: Secondary | ICD-10-CM | POA: Diagnosis not present

## 2021-02-26 NOTE — Progress Notes (Signed)
Virtual Visit via Video Note  I connected with Evelyn Valdez on 02/26/21 at 11:00 AM EDT by a video enabled telemedicine application and verified that I am speaking with the correct person using two identifiers.  Video connection was lost when less than 50% of the duration of the visit was complete, at which time the remainder of the visit was completed via audio only.   Location: Patient: home Provider: remote office [place], [place])   I discussed the limitations of evaluation and management by telemedicine and the availability of in person appointments. The patient expressed understanding and agreed to proceed.  I discussed the assessment and treatment plan with the patient. The patient was provided an opportunity to ask questions and all were answered. The patient agreed with the plan and demonstrated an understanding of the instructions.   The patient was advised to call back or seek an in-person evaluation if the symptoms worsen or if the condition fails to improve as anticipated.  I provided 30 minutes of non-face-to-face time during this encounter.   [place], LCSW   THERAPIST PROGRESS NOTE  Session Time: 11:30a-12:p  Participation Level: Active  Behavioral Response: NAAlertAnxious  Type of Therapy: Individual Therapy  Treatment Goals addressed: Coping  Interventions: CBT and DBT  Summary: Evelyn Valdez is a 80 y.o. female who presents with Improving symptoms related to diagnosis. Evelyn Valdez feels like her mood is stable, and that she is managing situational anxiety well. Pt reporting good quality and quantity of sleep.  Patient reports that she is not feeling well today, she has had a cold for around two weeks now, and her husband is also sick.   Patient reports because of this she has not been able to attend church, and his feeling kind of isolated. Patient reports that she does have a doctors appointment later on and is glad that she's moving  forward to get some help and hopefully will continue to rest so she will recover.  Allowed patient to explore and express thoughts and feelings about recent external stressors. Patient reports that she's worried that she's missed church for the last couple of weeks, but feels secure in her relationship with God. Patient states that a couple of church members have reached out to her, so she does feel like she has some support there. Discussed relationships with others at church, and patient has mixed feelings about it. Allow patient to explore her feelings the pros and the cons. Patient feels that she wants to just walk her own path regardless of acceptance or rejection from others.  Discussed relationship with husband, and patient feels that her husband has some trust issues, not just with her but with everyone. Patient reports that there are cameras all over her house and recording devices so that her husband can listen to conversations that go on anytime anyone is in their home. Gave patient safe space to explore her feelings. Patient did express out loud that she did not feel like her counseling session was safe either because she feels that her husband is listening into the conversation each time.  Discussed patients relationships with sisters common they recently came for a visit and patient did feel that she was a bit rejected by them. Patient feels like because they all live in the same state that they are closer because they see each other more then when she comes to visit in [place] or when they come to visit in [place].  Allowed patient to explore financial  strains and stressors, and how patient feels like she should be able to use her retirement money however she wants. Explored her personal experience with finances, and family history/socialization.   Compared and contrasted her upbringing versus husband's upbringing to help understand any differences. Discussed positive recreational  activities and positive activities that patient can do to boost mood. Patient enjoys crafting, encouraged patient to continue with crafting activities.  Continued recommendations are as follows: self care behaviors, positive social engagements, focusing on overall work/home/life balance, and focusing on positive physical and emotional wellness.     Suicidal/Homicidal: No  Therapist Response: [name] is able to describe the signs and symptoms of depression that she is currently experiencing. [name] is able to verbally express understanding of the relationship between depressed mood and repression of feelings from past. [name] is able to make positive statements regarding self and ability to cope with stresses of life.  Plan: Return again in 4 weeks.  Diagnosis: Axis I: MDD, recurrent, moderate    Axis II: No diagnosis    Evelyn Bo Clell Trahan, LCSW 02/26/2021

## 2021-03-19 DIAGNOSIS — H52203 Unspecified astigmatism, bilateral: Secondary | ICD-10-CM | POA: Diagnosis not present

## 2021-03-19 DIAGNOSIS — H0100A Unspecified blepharitis right eye, upper and lower eyelids: Secondary | ICD-10-CM | POA: Diagnosis not present

## 2021-03-19 DIAGNOSIS — H16223 Keratoconjunctivitis sicca, not specified as Sjogren's, bilateral: Secondary | ICD-10-CM | POA: Diagnosis not present

## 2021-03-19 DIAGNOSIS — Z961 Presence of intraocular lens: Secondary | ICD-10-CM | POA: Diagnosis not present

## 2021-03-19 DIAGNOSIS — H5203 Hypermetropia, bilateral: Secondary | ICD-10-CM | POA: Diagnosis not present

## 2021-03-19 DIAGNOSIS — H0100B Unspecified blepharitis left eye, upper and lower eyelids: Secondary | ICD-10-CM | POA: Diagnosis not present

## 2021-03-19 DIAGNOSIS — H524 Presbyopia: Secondary | ICD-10-CM | POA: Diagnosis not present

## 2021-03-19 DIAGNOSIS — H43813 Vitreous degeneration, bilateral: Secondary | ICD-10-CM | POA: Diagnosis not present

## 2021-03-19 DIAGNOSIS — H26492 Other secondary cataract, left eye: Secondary | ICD-10-CM | POA: Diagnosis not present

## 2021-03-19 DIAGNOSIS — R7303 Prediabetes: Secondary | ICD-10-CM | POA: Diagnosis not present

## 2021-04-04 ENCOUNTER — Other Ambulatory Visit: Payer: Self-pay

## 2021-04-04 ENCOUNTER — Ambulatory Visit (INDEPENDENT_AMBULATORY_CARE_PROVIDER_SITE_OTHER): Payer: PPO | Admitting: Licensed Clinical Social Worker

## 2021-04-04 DIAGNOSIS — F331 Major depressive disorder, recurrent, moderate: Secondary | ICD-10-CM

## 2021-04-04 NOTE — Progress Notes (Signed)
Virtual Visit via Video Note  I connected with Evelyn Valdez on 04/04/21 at 11:00 AM EDT by a video enabled telemedicine application and verified that I am speaking with the correct person using two identifiers.  Location: Patient: home Provider: [facility]   I discussed the limitations of evaluation and management by telemedicine and the availability of in person appointments. The patient expressed understanding and agreed to proceed.  I discussed the assessment and treatment plan with the patient. The patient was provided an opportunity to ask questions and all were answered. The patient agreed with the plan and demonstrated an understanding of the instructions.   The patient was advised to call back or seek an in-person evaluation if the symptoms worsen or if the condition fails to improve as anticipated.  I provided 60 minutes of non-face-to-face time during this encounter.   [place], LCSW   THERAPIST PROGRESS NOTE  Session Time: 11a-12p  Participation Level: Active  Behavioral Response: Neat and Well GroomedAlertAnxious and Depressed  Type of Therapy: Individual Therapy  Treatment Goals addressed: Diagnosis: depression  Interventions: CBT and Family Systems  Summary: Evelyn Valdez is a 80 y.o. female who presents with reports improving symptoms related to depression diagnosis. Patient reports overall mood has been stable, and that she's managing situational stress as well. Patient reports good quality and quantity of sleep. Allowed patient safe space to explore thoughts and feelings associated with recent external stressors and current life events. Patient reports is she still suffering with chronic pain that she thought would be improved by having a back surgery. Patient reports that the back surgeon made the statement that there was no guarantee with the surgery, and that she could be dealing with the chronic pain for life. Allowed patients a space to  explore relationship with husband, and how her expectations and husband's expectations often differ. Discussed relationship with daughter, and how daughters addiction has impacted overall family functioning. Patients husband came in at the end of session and spoke briefly with counselor. Invited patient husband to come in for next session face to face. Patient and husband both agreed.Continued recommendations are as follows: self care behaviors, positive social engagements, focusing on overall work/home/life balance, and focusing on positive physical and emotional wellness.  .   Suicidal/Homicidal: No  Therapist Response: Evelyn Valdez is able to describe the signs and symptoms of depression that she is currently experiencing. Evelyn Valdez is able to verbally express understanding of the relationship between depressed mood and repression of feelings from past. Evelyn Valdez is able to make positive statements regarding self and ability to cope with stresses of life. Evelyn Valdez is increasing overall levels of self awareness. These behaviors are reflective of both personal growth and progress. Treatment to continue as indicated.   Plan: Return again in 4 weeks.  Diagnosis: Axis I: MDD, recurrent, moderate    Axis II: No diagnosis    [place], LCSW 04/04/2021

## 2021-04-17 DIAGNOSIS — M170 Bilateral primary osteoarthritis of knee: Secondary | ICD-10-CM | POA: Diagnosis not present

## 2021-04-17 DIAGNOSIS — Z96641 Presence of right artificial hip joint: Secondary | ICD-10-CM | POA: Diagnosis not present

## 2021-04-17 DIAGNOSIS — M7051 Other bursitis of knee, right knee: Secondary | ICD-10-CM | POA: Diagnosis not present

## 2021-04-17 DIAGNOSIS — M7052 Other bursitis of knee, left knee: Secondary | ICD-10-CM | POA: Diagnosis not present

## 2021-05-04 DIAGNOSIS — J4530 Mild persistent asthma, uncomplicated: Secondary | ICD-10-CM | POA: Diagnosis not present

## 2021-05-04 DIAGNOSIS — M791 Myalgia, unspecified site: Secondary | ICD-10-CM | POA: Diagnosis not present

## 2021-05-04 DIAGNOSIS — E782 Mixed hyperlipidemia: Secondary | ICD-10-CM | POA: Diagnosis not present

## 2021-05-09 ENCOUNTER — Other Ambulatory Visit: Payer: Self-pay

## 2021-05-09 ENCOUNTER — Ambulatory Visit (INDEPENDENT_AMBULATORY_CARE_PROVIDER_SITE_OTHER): Payer: PPO | Admitting: Licensed Clinical Social Worker

## 2021-05-09 DIAGNOSIS — F331 Major depressive disorder, recurrent, moderate: Secondary | ICD-10-CM | POA: Diagnosis not present

## 2021-05-09 NOTE — Plan of Care (Signed)
  Problem: Depression CCP Problem  1:  Improve overall mood Intervention: Work with Mel Almond to identify the major components of a recent episode of depression: physical symptoms, major thoughts and images, and major behaviors they experienced

## 2021-05-09 NOTE — Plan of Care (Signed)
Developed plan today

## 2021-05-09 NOTE — Progress Notes (Signed)
   THERAPIST PROGRESS NOTE  Session Time: 10-11a  Participation Level: Active  Behavioral Response: Neat and Well GroomedAlertAnxious and Depressed  Type of Therapy: Individual Therapy  Treatment Goals addressed: Anxiety, Coping, and Diagnosis: depression  Interventions: CBT and Solution Focused  Summary: Evelyn Valdez is a 80 y.o. female who presents with continuing symptoms related to depression diagnosis. Pt is accompanied by her husband, Donnie, throughout session. Allowed pt and her husband to explore and express thoughts and feelings about recent life events and stressors. Discussed expectations versus reality. Discussed expectations each partner has for each other. Discussed relationships with family members. Offered unconditional support. Reviewed coping skills for managing stress/anxiety/depression. Continued recommendations are as follows: self care behaviors, positive social engagements, focusing on overall work/home/life balance, and focusing on positive physical and emotional wellness.  .   Suicidal/Homicidal: No  Therapist Response: [name] is able to describe the signs and symptoms of depression that she is currently experiencing. [name] is able to verbally express understanding of the relationship between depressed mood and repression of feelings from past. [name] is able to make positive statements regarding self and ability to cope with stresses of life. [name] is increasing overall levels of self awareness and accountability. These behaviors are reflective of both personal growth and progress. Treatment to continue as indicated.  Plan: Return again in 4 weeks.  Diagnosis: Axis I: MDD, recurrent, moderate    Axis II: No diagnosis    [place], LCSW 05/09/2021

## 2021-05-18 DIAGNOSIS — Z0184 Encounter for antibody response examination: Secondary | ICD-10-CM | POA: Diagnosis not present

## 2021-06-06 ENCOUNTER — Other Ambulatory Visit: Payer: Self-pay

## 2021-06-06 ENCOUNTER — Ambulatory Visit (INDEPENDENT_AMBULATORY_CARE_PROVIDER_SITE_OTHER): Payer: PPO | Admitting: Licensed Clinical Social Worker

## 2021-06-06 DIAGNOSIS — F331 Major depressive disorder, recurrent, moderate: Secondary | ICD-10-CM | POA: Diagnosis not present

## 2021-06-06 NOTE — Plan of Care (Signed)
  Problem: Depression CCP Problem  1:  Improve overall mood Goal: LTG: [name] will score less than 10 on the Patient Health Questionnaire (PHQ-9) Outcome: Progressing Goal: STG: [name] will participate in at least 80% of scheduled individual psychotherapy sessions Outcome: Progressing Intervention: Work with [name] to identify their 3 personal goals for managing depression symptoms and add to this plan Intervention: Review PLEASE Skills (Treat Physical Illness, Balance Eating, Avoid Mood-Altering Substances, Balance Sleep and Get Exercise) with [name] Intervention: Encourage patient to identify triggers Intervention: Promote bonding by encouraging participation in and evaluation of care Description: Husband participated in therapy session

## 2021-06-06 NOTE — Progress Notes (Signed)
   THERAPIST PROGRESS NOTE  Session Time: 88F-02D  Participation Level: Active  Behavioral Response: Neat and Well GroomedAlertAnxious, Depressed, and Irritable  Type of Therapy: Individual Therapy  Treatment Goals addressed:  Problem: Depression CCP Problem  1:  Improve overall mood  Goal: LTG: [name] will score less than 10 on the Patient Health Questionnaire (PHQ-9) Outcome: Progressing  Goal: STG: [name] will participate in at least 80% of scheduled individual psychotherapy sessions Outcome: Progressing  Interventions:  Intervention: Work with [name] to identify their 3 personal goals for managing depression symptoms and add to this plan  Intervention: Review PLEASE Skills (Treat Physical Illness, Balance Eating, Avoid Mood-Altering Substances, Balance Sleep and Get Exercise) with [name]  Intervention: Encourage patient to identify triggers  Intervention: Promote bonding by encouraging participation in and evaluation of care Description: Husband participated in therapy session  Summary: Evelyn Valdez is a 80 y.o. female who presents with improving symptoms related to depression diagnosis. Pt reports that she gets fair quality and quantity of sleep. Discussed sleep environments and what works best for pt.  Allowed pt to explore and express thoughts and feelings associated with recent life situations and external stressors. Discussed: relationship with children, pts self awareness about spirituality, relationship-building skills with husband, identifying anger triggers, emotion regulation strategies, and identification of respect/disrespect triggers. Reviewed coping skills and encouraged relationship building strategies. Shared psychoeducational resources with pt and husband  Continued recommendations are as follows: self care behaviors, positive social engagements, focusing on overall work/home/life balance, and focusing on positive  physical and emotional wellness. .   Suicidal/Homicidal: No  Therapist Response: Pt is continuing to apply interventions learned in session into daily life situations. Pt is currently on track to meet goals utilizing interventions mentioned above. Personal growth and progress noted. Treatment to continue as indicated.    Plan: Return again in 4 weeks.  Diagnosis: Axis I: MDD, recurrent    Axis II: No diagnosis    [place], LCSW 06/06/2021

## 2021-07-06 DIAGNOSIS — R922 Inconclusive mammogram: Secondary | ICD-10-CM | POA: Diagnosis not present

## 2021-07-06 DIAGNOSIS — R928 Other abnormal and inconclusive findings on diagnostic imaging of breast: Secondary | ICD-10-CM | POA: Diagnosis not present

## 2021-07-18 ENCOUNTER — Ambulatory Visit (INDEPENDENT_AMBULATORY_CARE_PROVIDER_SITE_OTHER): Payer: PPO | Admitting: Licensed Clinical Social Worker

## 2021-07-18 ENCOUNTER — Other Ambulatory Visit: Payer: Self-pay

## 2021-07-18 DIAGNOSIS — F331 Major depressive disorder, recurrent, moderate: Secondary | ICD-10-CM | POA: Diagnosis not present

## 2021-07-18 NOTE — Progress Notes (Signed)
   THERAPIST PROGRESS NOTE  Session Time: 10-11a  Participation Level: Active  Behavioral Response: Neat and Well GroomedAlertAnxious, Depressed, and Irritable  Type of Therapy: Individual Therapy  Treatment Goals addressed:  Problem: Depression CCP Problem  1:  Improve overall mood Goal: LTG: [name] will score less than 10 on the Patient Health Questionnaire (PHQ-9) Outcome: Progressing Goal: STG: [name] will participate in at least 80% of scheduled individual psychotherapy sessions Outcome: Progressing   Interventions:  Helped identify triggers Reviewed coping skills depression/anxiety  Summary: Evelyn Valdez is a 80 y.o. female who presents with improving symptoms related to depression diagnosis. Pts husband, [name], is also present throughout session. Pt feels overall mood has been stable. Pt reports good quality and quantity of sleep. Pt engaging socially with neighbors and family members.   Discussed thoughts and feelings pt has about the adjustment phase from being very independent to semi-dependent on others to help with physical activities that pt can no longer manage. Pt feels a bit overwhelmed with the upcoming holiday season with some minor conflict within the family/neighbor.   Continued recommendations are as follows: self care behaviors, positive social engagements, focusing on overall work/home/life balance, and focusing on positive physical and emotional wellness.   Suicidal/Homicidal: No  Therapist Response: Pt is continuing to apply interventions learned in session into daily life situations. Pt is currently on track to meet goals utilizing interventions mentioned above. Personal growth and progress noted. Treatment to continue as indicated.   Plan: Return again in 4 weeks.  Diagnosis: Axis I: MDD, recurrent    Axis II: No diagnosis    [name], LCSW 07/18/2021

## 2021-07-18 NOTE — Plan of Care (Signed)
  Problem: Depression CCP Problem  1:  Improve overall mood Goal: LTG: @PREFFIRSTNAME@ will score less than 10 on the Patient Health Questionnaire (PHQ-9) Outcome: Progressing Goal: STG: @PREFFIRSTNAME@ will participate in at least 80% of scheduled individual psychotherapy sessions Outcome: Progressing   

## 2021-08-28 ENCOUNTER — Other Ambulatory Visit: Payer: Self-pay

## 2021-08-28 ENCOUNTER — Ambulatory Visit (INDEPENDENT_AMBULATORY_CARE_PROVIDER_SITE_OTHER): Payer: PPO | Admitting: Licensed Clinical Social Worker

## 2021-08-28 DIAGNOSIS — F331 Major depressive disorder, recurrent, moderate: Secondary | ICD-10-CM

## 2021-08-28 NOTE — Plan of Care (Signed)
°  Problem: Depression CCP Problem  1:  Improve overall mood Goal: LTG: [name] will score less than 10 on the Patient Health Questionnaire (PHQ-9) Outcome: Progressing Goal: STG: [name] will participate in at least 80% of scheduled individual psychotherapy sessions Outcome: Progressing Intervention: Work with patient to identify the major components of a recent episode of anxiety: physical symptoms, major thoughts and images, and major behaviors they experienced Intervention: WORK WITH Evelyn Valdez TO IDENTIFY THE MAJOR COMPONENTS OF A RECENT EPISODE OF DEPRESSION: PHYSICAL SYMPTOMS, MAJOR THOUGHTS AND IMAGES, AND MAJOR BEHAVIORS THEY EXPERIENCED Intervention: Encourage effective communication techniques Intervention: Assist with coping skills and behavior

## 2021-08-28 NOTE — Progress Notes (Signed)
Virtual Visit via Video Note  I connected with Evelyn Valdez on 08/28/21 at  8:00 AM EST by a video enabled telemedicine application and verified that I am speaking with the correct person using two identifiers.  Video connection was lost when less than 50% of the duration of the visit was complete, at which time the remainder of the visit was completed via audio only.  Clinician could see pt on video throughout entire session--patient could not see clinician.  Location: Patient: home Provider: remote office [place], [place])   I discussed the limitations of evaluation and management by telemedicine and the availability of in person appointments. The patient expressed understanding and agreed to proceed.   I discussed the assessment and treatment plan with the patient. The patient was provided an opportunity to ask questions and all were answered. The patient agreed with the plan and demonstrated an understanding of the instructions.   The patient was advised to call back or seek an in-person evaluation if the symptoms worsen or if the condition fails to improve as anticipated.  I provided 45 minutes of non-face-to-face time during this encounter.   [place], LCSW    THERAPIST PROGRESS NOTE  Session Time: [phone]  Participation Level: Active  Behavioral Response: Neat and Well GroomedAlertAnxious, Depressed, and Irritable  Type of Therapy: Individual Therapy  Treatment Goals addressed:  Problem: Depression CCP Problem  1:  Improve overall mood  Goal: LTG: [name] will score less than 10 on the Patient Health Questionnaire (PHQ-9) Outcome: Progressing  Goal: STG: [name] will participate in at least 80% of scheduled individual psychotherapy sessions Outcome: Progressing   Interventions:  Intervention: Work with patient to identify the major components of a recent episode of anxiety: physical symptoms, major thoughts and images, and major  behaviors they experienced  Intervention: WORK WITH Evelyn Valdez TO IDENTIFY THE MAJOR COMPONENTS OF A RECENT EPISODE OF DEPRESSION: PHYSICAL SYMPTOMS, MAJOR THOUGHTS AND IMAGES, AND MAJOR BEHAVIORS THEY EXPERIENCED  Intervention: Encourage effective communication techniques  Intervention: Assist with coping skills and behavior  Summary: Evelyn Valdez is a 80 y.o. female who presents with improving symptoms related to depression diagnosis. Pts husband, Evelyn Valdez, is also present off and on throughout session. Pt feels overall mood has been stable. Pt reports good quality and quantity of sleep. Pt engaging socially with neighbors and family members.   Allowed pt to explore and express thoughts and feelings associated with recent life situations and external stressors. Discussed pts relationship with daughter and grandchildren. Pt reports that she recently saw her daughter present at a house near her house--pt was not aware that her daughter was residing that close and thought she was living in [place]. Explored relationships with siblings as well and pros/cons of the relationships. Discussed pts self awareness versus other's lack of self awareness and how this can impact relationships negatively.   Reviewed coping skills to help manage depression/anxiety.   Continued recommendations are as follows: self care behaviors, positive social engagements, focusing on overall work/home/life balance, and focusing on positive physical and emotional wellness.   Suicidal/Homicidal: No  Therapist Response: Pt is continuing to apply interventions learned in session into daily life situations. Pt is currently on track to meet goals utilizing interventions mentioned above. Personal growth and progress noted. Treatment to continue as indicated.   Plan: Return again in 4 weeks.  Diagnosis: Axis I: MDD, recurrent    Axis II: No diagnosis    Evelyn Bo Irmalee Riemenschneider, LCSW 08/28/2021

## 2021-10-17 ENCOUNTER — Other Ambulatory Visit: Payer: Self-pay

## 2021-10-17 ENCOUNTER — Ambulatory Visit (INDEPENDENT_AMBULATORY_CARE_PROVIDER_SITE_OTHER): Payer: PPO | Admitting: Licensed Clinical Social Worker

## 2021-10-17 DIAGNOSIS — F331 Major depressive disorder, recurrent, moderate: Secondary | ICD-10-CM

## 2021-10-18 NOTE — Progress Notes (Signed)
° °  THERAPIST PROGRESS NOTE  Session Time: 89Q-11H  Participation Level: Active  Behavioral Response: Neat and Well GroomedAlertAnxious and Depressed  Type of Therapy: Individual Therapy  Treatment Goals addressed:  Problem: Depression CCP Problem  1:  Improve overall mood  Goal: LTG: [name] will score less than 10 on the Patient Health Questionnaire (PHQ-9)  Outcome: Progressing  Goal: STG: [name] will participate in at least 80% of scheduled individual psychotherapy sessions  Outcome: Progressing  ProgressTowards Goals: Progressing  Interventions: Solution Focused, Supportive, and Family Systems  Summary: Evelyn Valdez is a 81 y.o. female who presents with improving symptoms related to depression diagnosis. Pt reports that mood has been stable since last session. .   Allowed pt to explore and express thoughts and feelings associated with recent life situations and external stressors.Continued to explore relationship with husband and relationship with other family members. Pt reports that she fell recently--allowed pt to explore potential hazards both in and around the home and ways of managing those for overall safety purposes. Discussed pts recent trip to [facility] to attend a wedding--pt enjoyed the trip and tried to stay in the moment to enjoy it and not worry too much about other people and the choices they are or are not making. Discussed intimacy with husband and several relationship-building strategies. Discussed psychological impact of chronic pain and managing pain.  Continued recommendations are as follows: self care behaviors, positive social engagements, focusing on overall work/home/life balance, and focusing on positive physical and emotional wellness.    Suicidal/Homicidal: No  Therapist Response: Pt is continuing to apply interventions learned in session into daily life situations. Pt is currently on track to meet goals utilizing interventions  mentioned above. Personal growth and progress noted. Treatment to continue as indicated.   Plan: Return again in 4 weeks.  Diagnosis: MDD (major depressive disorder), recurrent episode, moderate ([place])  Collaboration of Care: Other n/a at time of session.  Patient/Guardian was advised Release of Information must be obtained prior to any record release in order to collaborate their care with an outside provider. Patient/Guardian was advised if they have not already done so to contact the registration department to sign all necessary forms in order for [place] to release information regarding their care.   Consent: Patient/Guardian gives verbal consent for treatment and assignment of benefits for services provided during this visit. Patient/Guardian expressed understanding and agreed to proceed.   [place], LCSW 10/18/2021

## 2021-10-19 NOTE — Plan of Care (Signed)
°  Problem: Depression CCP Problem  1:  Improve overall mood Goal: LTG: [name] will score less than 10 on the Patient Health Questionnaire (PHQ-9) Outcome: Progressing Goal: STG: [name] will participate in at least 80% of scheduled individual psychotherapy sessions Outcome: Progressing Intervention: Encourage patient to identify triggers Intervention: Encourage verbalization of feelings/concerns/expectations Intervention: Encourage effective communication techniques

## 2021-11-28 ENCOUNTER — Other Ambulatory Visit: Payer: Self-pay

## 2021-11-28 ENCOUNTER — Ambulatory Visit (INDEPENDENT_AMBULATORY_CARE_PROVIDER_SITE_OTHER): Payer: PPO | Admitting: Licensed Clinical Social Worker

## 2021-11-28 DIAGNOSIS — F331 Major depressive disorder, recurrent, moderate: Secondary | ICD-10-CM | POA: Diagnosis not present

## 2021-11-28 NOTE — Plan of Care (Signed)
?  Problem: Depression CCP Problem  1:  Improve overall mood ?Goal: LTG: '@PREFFIRSTNAME'$ @ will score less than 10 on the Patient Health Questionnaire (PHQ-9) ?Outcome: Progressing ?Goal: STG: '@PREFFIRSTNAME'$ @ will participate in at least 80% of scheduled individual psychotherapy sessions ?Outcome: Progressing ?Intervention: Encourage verbalization of feelings/concerns/expectations ?Intervention: Encourage patient to set small goals for self ?Intervention: EDUCATE Shaneequa ON COMMON REACTIONS TO A TRAUMATIC EXPERIENCE ?  ?
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
## 2022-01-16 ENCOUNTER — Ambulatory Visit (INDEPENDENT_AMBULATORY_CARE_PROVIDER_SITE_OTHER): Payer: PPO | Admitting: Licensed Clinical Social Worker

## 2022-01-16 DIAGNOSIS — F331 Major depressive disorder, recurrent, moderate: Secondary | ICD-10-CM | POA: Diagnosis not present

## 2022-01-16 NOTE — Plan of Care (Signed)
?  Problem: Depression CCP Problem  1:  Improve overall mood ?Goal: LTG: '@PREFFIRSTNAME'$ @ will score less than 10 on the Patient Health Questionnaire (PHQ-9) ?Outcome: Progressing ?Goal: STG: '@PREFFIRSTNAME'$ @ will participate in at least 80% of scheduled individual psychotherapy sessions ?Outcome: Progressing ?Intervention: REVIEW PLEASE SKILLS (TREAT PHYSICAL ILLNESS, BALANCE EATING, AVOID MOOD-ALTERING SUBSTANCES, BALANCE SLEEP AND GET EXERCISE) WITH [place] ?Note: reviewed ?Intervention: Monitor coping skills and behavior ?Note: Reviewed ?Intervention: Encourage verbalization of feelings/concerns/expectations ?Note: explored ?  ?

## 2022-01-16 NOTE — Progress Notes (Signed)
?  THERAPIST PROGRESS NOTE ? ?Session Time: 16X-09U ? ?[facility] in office visit for patient and LCSW clinician ? ?Participation Level: Active ? ?Behavioral Response: Neat and Well GroomedAlertAnxious and Depressed ? ?Type of Therapy: Individual Therapy ? ?Treatment Goals addressed: Problem: Depression CCP Problem  1:  Improve overall mood ?Goal: LTG: '@PREFFIRSTNAME'$ @ will score less than 10 on the Patient Health Questionnaire (PHQ-9) ?Outcome: Progressing ?Goal: STG: '@PREFFIRSTNAME'$ @ will participate in at least 80% of scheduled individual psychotherapy sessions ?Outcome: Progressing ?Intervention: REVIEW PLEASE SKILLS (TREAT PHYSICAL ILLNESS, BALANCE EATING, AVOID MOOD-ALTERING SUBSTANCES, BALANCE SLEEP AND GET EXERCISE) WITH Shala ?Note: reviewed ?Intervention: Monitor coping skills and behavior ?Note: Reviewed ?Intervention: Encourage verbalization of feelings/concerns/expectations ?Note: explored ?  ? ?ProgressTowards Goals: Progressing ? ?Interventions: Solution Focused, Supportive, and Other: Trauma focused CBT ? ?Summary: Evelyn Valdez is a 81 y.o. female who presents with improving symptoms related to depression diagnosis. Pt reports that mood has been stable since last session.  ? ?Allowed pt to explore and express thoughts and feelings associated with recent life situations and external stressors. Explored relationship with daughter, marriage, recent medication changes/side effects, and compulsive behavior management. ? ?Continued recommendations are as follows: self care behaviors, positive social engagements, focusing on overall work/home/life balance, and focusing on positive physical and emotional wellness.  ? ? ?Suicidal/Homicidal: No ? ?Therapist Response: Pt is continuing to apply interventions learned in session into daily life situations. Pt is currently on track to meet goals utilizing interventions mentioned above. Personal growth and progress noted. Treatment to continue as indicated.   ? ?Plan: Return again in 4 weeks. ? ?Diagnosis: MDD (major depressive disorder), recurrent episode, moderate ([place]) ? ?Collaboration of Care: Other n/a at time of session. ? ?Patient/Guardian was advised Release of Information must be obtained prior to any record release in order to collaborate their care with an outside provider. Patient/Guardian was advised if they have not already done so to contact the registration department to sign all necessary forms in order for [place] to release information regarding their care.  ? ?Consent: Patient/Guardian gives verbal consent for treatment and assignment of benefits for services provided during this visit. Patient/Guardian expressed understanding and agreed to proceed.  ? ?Infant Doane R Kailiana Granquist, LCSW ?01/16/2022 ? ?

## 2022-03-07 ENCOUNTER — Ambulatory Visit (INDEPENDENT_AMBULATORY_CARE_PROVIDER_SITE_OTHER): Payer: PPO | Admitting: Licensed Clinical Social Worker

## 2022-03-07 DIAGNOSIS — F331 Major depressive disorder, recurrent, moderate: Secondary | ICD-10-CM

## 2022-03-07 NOTE — Plan of Care (Signed)
  Problem: Depression CCP Problem  1:  Improve overall mood Goal: LTG: '@PREFFIRSTNAME'$ @ will score less than 10 on the Patient Health Questionnaire (PHQ-9) Outcome: Progressing Goal: STG: '@PREFFIRSTNAME'$ @ will participate in at least 80% of scheduled individual psychotherapy sessions Outcome: Progressing Intervention: REVIEW PLEASE SKILLS (TREAT PHYSICAL ILLNESS, BALANCE EATING, AVOID MOOD-ALTERING SUBSTANCES, BALANCE SLEEP AND GET EXERCISE) WITH Sheccid Note: Reviewed  Intervention: Encourage verbalization of feelings/concerns/expectations Note: Encouraged/assisted with identification

## 2022-03-07 NOTE — Progress Notes (Signed)
Virtual Visit via Video Note  I connected with Evelyn Valdez on 03/07/22 at  1:00 PM EDT by a video enabled telemedicine application and verified that I am speaking with the correct person using two identifiers.  Location: Patient: home Provider: [facility]   I discussed the limitations of evaluation and management by telemedicine and the availability of in person appointments. The patient expressed understanding and agreed to proceed.  I discussed the assessment and treatment plan with the patient. The patient was provided an opportunity to ask questions and all were answered. The patient agreed with the plan and demonstrated an understanding of the instructions.   The patient was advised to call back or seek an in-person evaluation if the symptoms worsen or if the condition fails to improve as anticipated.  I provided 60 minutes of non-face-to-face time during this encounter.   Glennette Galster R Virgle Arth, LCSW  THERAPIST PROGRESS NOTE  Session Time: 1-2p  Participation Level: Active  Behavioral Response: Neat and Well GroomedAlertAnxious and Depressed  Type of Therapy: Individual Therapy  Treatment Goals addressed: Problem: Depression CCP Problem  1:  Improve overall mood Goal: LTG: '@PREFFIRSTNAME'$ @ will score less than 10 on the Patient Health Questionnaire (PHQ-9) Outcome: Progressing Goal: STG: '@PREFFIRSTNAME'$ @ will participate in at least 80% of scheduled individual psychotherapy sessions Outcome: Progressing Intervention: REVIEW PLEASE SKILLS (TREAT PHYSICAL ILLNESS, BALANCE EATING, AVOID MOOD-ALTERING SUBSTANCES, BALANCE SLEEP AND GET EXERCISE) WITH Shanin Note: Reviewed  Intervention: Encourage verbalization of feelings/concerns/expectations Note: Encouraged/assisted with identification  ProgressTowards Goals: Progressing  Interventions: Solution Focused, Supportive, and Other: Trauma focused CBT  Summary: Evelyn Valdez is a  81 y.o. female who presents with improving symptoms related to depression diagnosis. Pt reports that mood has been stable since last session.   Allowed pt to explore and express thoughts and feelings associated with recent life situations and external stressors. Allowed patient to explore various stressors that she has been experiencing recently. Patient reports that she feels she's been experiencing hypersomnia recently, and feels that it is because "I'm tired of it all". patient denies any SI, and has very strong religious beliefs, which is a protective factor regarding suicidal ideation. patient expressed that she had a recent run in with her sisters when they were here visiting with her. Patient reports that she feels that her sisters are more open minded than herself, and their expectations are that she is going to change her beliefs to be more accepting of them, yet they are not willing to be acceptive of her and her beliefs. Validated patient's perspective, and offered unconditional support and guidance. Assist a patient with identifying what statements were triggers to family members. Patient reports that she has remorse about the situation and has apologized verbally and in writing to her family members.  Conversations about family members triggered patient to recall her relationship with her father, and how her father is tied to traumas from the past. 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  Patient reports that her doctors have informed her that she needs to lose weight to make it easier on her joints and spinal column--patient reports that she has struggled with her weight her entire life and does not want to focus on weight loss at this point in her life. Allowed patient to explore the difference between healthy choices and a structured diet. Patient reflects understanding and wants to make better choices.  Continued recommendations are as  follows: self care behaviors, positive social engagements, focusing on overall work/home/life balance, and focusing on positive physical and emotional wellness.    Suicidal/Homicidal: No  Therapist Response: Pt is continuing to apply interventions learned in session into daily life situations. Pt is currently on track to meet goals utilizing interventions mentioned above. Personal growth and progress noted. Treatment to continue as indicated.   Plan: Return again in 4 weeks.  Diagnosis: MDD (major depressive disorder), recurrent episode, moderate ([place])  Collaboration of Care: Other n/a at time of session.  Patient/Guardian was advised Release of Information must be obtained prior to any record release in order to collaborate their care with an outside provider. Patient/Guardian was advised if they have not already done so to contact the registration department to sign all necessary forms in order for [place] to release information regarding their care.   Consent: Patient/Guardian gives verbal consent for treatment and assignment of benefits for services provided during this visit. Patient/Guardian expressed understanding and agreed to proceed.   [place], LCSW 03/07/2022

## 2022-04-12 ENCOUNTER — Ambulatory Visit (HOSPITAL_COMMUNITY): Payer: PPO | Admitting: Licensed Clinical Social Worker

## 2022-04-18 ENCOUNTER — Ambulatory Visit (INDEPENDENT_AMBULATORY_CARE_PROVIDER_SITE_OTHER): Payer: PPO | Admitting: Licensed Clinical Social Worker

## 2022-04-18 DIAGNOSIS — F331 Major depressive disorder, recurrent, moderate: Secondary | ICD-10-CM

## 2022-04-18 NOTE — Plan of Care (Signed)
  Problem: Depression CCP Problem  1:  Improve overall mood Goal: LTG: '@PREFFIRSTNAME'$ @ will score less than 10 on the Patient Health Questionnaire (PHQ-9) Outcome: Progressing Goal: STG: '@PREFFIRSTNAME'$ @ will participate in at least 80% of scheduled individual psychotherapy sessions Outcome: Progressing Intervention: REVIEW PLEASE SKILLS (TREAT PHYSICAL ILLNESS, BALANCE EATING, AVOID MOOD-ALTERING SUBSTANCES, BALANCE SLEEP AND GET EXERCISE) WITH Jearldean Note: Reviewed Intervention: Monitor coping skills and behavior Note: Reviewed Intervention: Encourage verbalization of feelings/concerns/expectations Note: Allowed exploration and expression.
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
## 2022-05-30 ENCOUNTER — Ambulatory Visit (INDEPENDENT_AMBULATORY_CARE_PROVIDER_SITE_OTHER): Payer: PPO | Admitting: Licensed Clinical Social Worker

## 2022-05-30 DIAGNOSIS — F331 Major depressive disorder, recurrent, moderate: Secondary | ICD-10-CM | POA: Diagnosis not present

## 2022-05-30 NOTE — Progress Notes (Signed)
  THERAPIST PROGRESS NOTE  Session Time: 9:10-10a  [facility] in office visit for patient and LCSW clinician  Participation Level: Active  Behavioral Response: Neat and Well GroomedAlertAnxious and Depressed  Type of Therapy: Individual Therapy  Treatment Goals addressed: Problem: Depression CCP Problem  1:  Improve overall mood Goal: LTG: '@PREFFIRSTNAME'$ @ will score less than 10 on the Patient Health Questionnaire (PHQ-9) Outcome: Progressing Goal: STG: '@PREFFIRSTNAME'$ @ will participate in at least 80% of scheduled individual psychotherapy sessions Outcome: Progressing Intervention: Monitor coping skills and behavior Note: Reviewed  Intervention: Encourage verbalization of feelings/concerns/expectations Note: Allowed/explored   ProgressTowards Goals: Progressing  Interventions: Solution Focused, Supportive, and Other: Trauma focused CBT  Summary: Evelyn Valdez is a 81 y.o. female who presents with improving symptoms related to depression diagnosis. Pt reports that mood has been stable since last session.   Allowed pt to explore and express thoughts and feelings associated with recent life situations and external stressors. Pt reports that she had a good visit with her sisters recently--they helped her reorganize several rooms in her home. Pt enjoyed spending quality time with her sisters.   Discussed life timeline--allowed pt to explore life when she was younger, in her single years, and life married to her husband. Discussed intimacy concerns and how pt allows her religious beliefs to dictate some of her intimacy.   Discussed pts return to the church environment and how she has intentionally set limits and boundaries to make it a positive experience. Pt elects to not attend the small women's groups but enjoys the services. Encouraged pt to continue if it is something bringing her joy/pleasure/happiness.   Pt reports relationship with husband and  family going well at time of session.  Continued recommendations are as follows: self care behaviors, positive social engagements, focusing on overall work/home/life balance, and focusing on positive physical and emotional wellness.   Pt presents with very positive, pleasant affect.   Suicidal/Homicidal: No  Therapist Response: Pt is continuing to apply interventions learned in session into daily life situations. Pt is currently on track to meet goals utilizing interventions mentioned above. Personal growth and progress noted. Treatment to continue as indicated.   Plan: Return again in 4 weeks.  Diagnosis:  Encounter Diagnosis  Name Primary?   MDD (major depressive disorder), recurrent episode, moderate ([place]) Yes   Collaboration of Care: Other n/a at time of session.  Patient/Guardian was advised Release of Information must be obtained prior to any record release in order to collaborate their care with an outside provider. Patient/Guardian was advised if they have not already done so to contact the registration department to sign all necessary forms in order for [place] to release information regarding their care.   Consent: Patient/Guardian gives verbal consent for treatment and assignment of benefits for services provided during this visit. Patient/Guardian expressed understanding and agreed to proceed.   [place], LCSW 05/30/2022

## 2022-05-30 NOTE — Plan of Care (Signed)
  Problem: Depression CCP Problem  1:  Improve overall mood Goal: LTG: '@PREFFIRSTNAME'$ @ will score less than 10 on the Patient Health Questionnaire (PHQ-9) Outcome: Progressing Goal: STG: '@PREFFIRSTNAME'$ @ will participate in at least 80% of scheduled individual psychotherapy sessions Outcome: Progressing Intervention: Monitor coping skills and behavior Note: Reviewed  Intervention: Encourage verbalization of feelings/concerns/expectations Note: Allowed/explored

## 2022-07-10 ENCOUNTER — Ambulatory Visit (INDEPENDENT_AMBULATORY_CARE_PROVIDER_SITE_OTHER): Payer: PPO | Admitting: Licensed Clinical Social Worker

## 2022-07-10 DIAGNOSIS — F331 Major depressive disorder, recurrent, moderate: Secondary | ICD-10-CM

## 2022-07-10 NOTE — Progress Notes (Signed)
  THERAPIST PROGRESS NOTE  Session Time: [place] in office visit for patient and LCSW clinician  Participation Level: Active  Behavioral Response: Neat and Well GroomedAlertAnxious and Depressed  Type of Therapy: Individual Therapy  Treatment Goals addressed: Problem: Depression CCP Problem  1:  Improve overall mood Goal: LTG: '@PREFFIRSTNAME'$ @ will score less than 10 on the Patient Health Questionnaire (PHQ-9) Outcome: Progressing Goal: STG: '@PREFFIRSTNAME'$ @ will participate in at least 80% of scheduled individual psychotherapy sessions Outcome: Progressing Intervention: Monitor coping skills and behavior Note: Reviewed  Intervention: Encourage verbalization of feelings/concerns/expectations Note: Allowed/explored   ProgressTowards Goals: Progressing  Interventions: Solution Focused, Supportive, and Other: Trauma focused CBT  Summary: Evelyn Valdez is a 81 y.o. female who presents with improving symptoms related to depression diagnosis. Pt reports that mood has been stable since last session.   Allowed pt to explore and express thoughts and feelings associated with recent life situations and external stressors. Discussed decision to stop doing crafts at church due to miscommunication. Pt putting a lot of money and time into crafts for the children--and enjoys that time/effort.   Discussed pts concerns about husband's diagnosis of aortic aneurysm. Pt reports that this is concerning for her and she wants to be supportive of him and his overall medical needs.   Pt currently taking ozympic--reports weight loss. 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 Encouraged pt to continue being emotional support for son.   Continued recommendations are as follows: self care behaviors, positive social engagements, focusing on overall work/home/life balance, and focusing on positive physical and emotional wellness.   Pt presents with  very positive, pleasant affect.   Suicidal/Homicidal: No  Therapist Response: Pt is continuing to apply interventions learned in session into daily life situations. Pt is currently on track to meet goals utilizing interventions mentioned above. Personal growth and progress noted. Treatment to continue as indicated.   Plan: Return again in 4 weeks.  Diagnosis:  Encounter Diagnosis  Name Primary?   MDD (major depressive disorder), recurrent episode, moderate ([place]) Yes   Collaboration of Care: Other n/a at time of session.  Patient/Guardian was advised Release of Information must be obtained prior to any record release in order to collaborate their care with an outside provider. Patient/Guardian was advised if they have not already done so to contact the registration department to sign all necessary forms in order for [place] to release information regarding their care.   Consent: Patient/Guardian gives verbal consent for treatment and assignment of benefits for services provided during this visit. Patient/Guardian expressed understanding and agreed to proceed.   [place], LCSW 07/10/2022

## 2022-07-10 NOTE — Plan of Care (Signed)
  Problem: Depression CCP Problem  1:  Improve overall mood Goal: LTG: '@PREFFIRSTNAME'$ @ will score less than 10 on the Patient Health Questionnaire (PHQ-9) Outcome: Progressing Goal: STG: '@PREFFIRSTNAME'$ @ will participate in at least 80% of scheduled individual psychotherapy sessions Outcome: Progressing

## 2022-08-21 ENCOUNTER — Ambulatory Visit (INDEPENDENT_AMBULATORY_CARE_PROVIDER_SITE_OTHER): Payer: PPO | Admitting: Licensed Clinical Social Worker

## 2022-08-21 DIAGNOSIS — F331 Major depressive disorder, recurrent, moderate: Secondary | ICD-10-CM | POA: Diagnosis not present

## 2022-08-21 NOTE — Progress Notes (Signed)
Virtual Visit via Video Note  I connected with Evelyn Valdez on 08/21/22 at 11:00 AM EST by a video enabled telemedicine application and verified that I am speaking with the correct person using two identifiers.  Location: Patient: home Provider: remote office [place], [place])   I discussed the limitations of evaluation and management by telemedicine and the availability of in person appointments. The patient expressed understanding and agreed to proceed.   I discussed the assessment and treatment plan with the patient. The patient was provided an opportunity to ask questions and all were answered. The patient agreed with the plan and demonstrated an understanding of the instructions.   The patient was advised to call back or seek an in-person evaluation if the symptoms worsen or if the condition fails to improve as anticipated.  I provided 55 minutes of non-face-to-face time during this encounter.   [place], LCSW  THERAPIST PROGRESS NOTE  Session Time: 81K-4818H  Participation Level: Active  Behavioral Response: Neat and Well GroomedAlertAnxious and Depressed  Type of Therapy: Individual Therapy  Treatment Goals addressed:  ProgressTowards Goals: Progressing  Interventions: Solution Focused, Supportive, and Other: Trauma focused CBT  Summary: Evelyn Valdez is a 81 y.o. female who presents with improving symptoms related to depression diagnosis. Pt reports that mood has been stable since last session.   Allowed pt to explore and express thoughts and feelings associated with recent life situations and external stressors. Discussed decision to stop doing crafts at church due to miscommunication. Pt putting a lot of money and time into crafts for the children--and enjoys that time/effort.   Discussed pts concerns about husband's diagnosis of aortic aneurysm. Pt reports that this is concerning for her and she wants to be supportive of him and his overall  medical needs.   Pt currently taking ozympic--reports weight loss. 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 Encouraged pt to continue being emotional support for son.   Continued recommendations are as follows: self care behaviors, positive social engagements, focusing on overall work/home/life balance, and focusing on positive physical and emotional wellness.   Pt presents with very positive, pleasant affect.   Suicidal/Homicidal: No  Therapist Response: Pt is continuing to apply interventions learned in session into daily life situations. Pt is currently on track to meet goals utilizing interventions mentioned above. Personal growth and progress noted. Treatment to continue as indicated.   Plan: Return again in 4 weeks.  Diagnosis:  Encounter Diagnosis  Name Primary?   MDD (major depressive disorder), recurrent episode, moderate ([place]) Yes   Collaboration of Care: Other n/a at time of session.  Patient/Guardian was advised Release of Information must be obtained prior to any record release in order to collaborate their care with an outside provider. Patient/Guardian was advised if they have not already done so to contact the registration department to sign all necessary forms in order for [place] to release information regarding their care.   Consent: Patient/Guardian gives verbal consent for treatment and assignment of benefits for services provided during this visit. Patient/Guardian expressed understanding and agreed to proceed.   [place], LCSW 08/21/2022

## 2022-08-28 ENCOUNTER — Other Ambulatory Visit: Payer: Self-pay | Admitting: Orthopedic Surgery

## 2022-08-28 DIAGNOSIS — M25512 Pain in left shoulder: Secondary | ICD-10-CM

## 2022-09-03 ENCOUNTER — Ambulatory Visit
Admission: RE | Admit: 2022-09-03 | Discharge: 2022-09-03 | Disposition: A | Discharge status: Home / Self Care | Payer: PPO | Source: Ambulatory Visit | Attending: Orthopedic Surgery | Admitting: Orthopedic Surgery

## 2022-09-03 DIAGNOSIS — M25512 Pain in left shoulder: Secondary | ICD-10-CM

## 2022-09-04 NOTE — Patient Instructions (Signed)
DUE TO COVID-19 ONLY TWO VISITORS  (aged 80 and older)  ARE ALLOWED TO COME WITH YOU AND STAY IN THE WAITING ROOM ONLY DURING PRE OP AND PROCEDURE.   **NO VISITORS ARE ALLOWED IN THE SHORT STAY AREA OR RECOVERY ROOM!!**  IF YOU WILL BE ADMITTED INTO THE HOSPITAL YOU ARE ALLOWED ONLY FOUR SUPPORT PEOPLE DURING VISITATION HOURS ONLY (7 AM -8PM)   The support person(s) must pass our screening, gel in and out, and wear a mask at all times, including in the patient's room. Patients must also wear a mask when staff or their support person are in the room. Visitors GUEST BADGE MUST BE WORN VISIBLY  One adult visitor may remain with you overnight and MUST be in the room by 8 P.M.     Your procedure is scheduled on: 09/16/22   Report to [facility] Main Entrance    Report to admitting at : 5:15 AM   Call this number if you have problems the morning of surgery [phone]   Do not eat food :After Midnight.   After Midnight you may have the following liquids until : 4:30 AM DAY OF SURGERY  Water Black Coffee (sugar ok, NO MILK/CREAM OR CREAMERS)  Tea (sugar ok, NO MILK/CREAM OR CREAMERS) regular and decaf                             Plain Jell-O (NO RED)                                           Fruit ices (not with fruit pulp, NO RED)                                     Popsicles (NO RED)                                                                  Juice: apple, WHITE grape, WHITE cranberry Sports drinks like Gatorade (NO RED)                 The day of surgery:  Drink ONE (1) Pre-Surgery Clear Ensure or G2 at: 4:30 AM the morning of surgery. Drink in one sitting. Do not sip.  This drink was given to you during your hospital  pre-op appointment visit. Nothing else to drink after completing the  Pre-Surgery Clear Ensure or G2.          If you have questions, please contact your surgeon's office   Oral Hygiene is also important to reduce your risk of infection.                                     Remember - BRUSH YOUR TEETH THE MORNING OF SURGERY WITH YOUR REGULAR TOOTHPASTE  DENTURES WILL BE REMOVED PRIOR TO SURGERY PLEASE DO NOT APPLY "Poly grip" OR ADHESIVES!!!   Do NOT smoke after Midnight   Take these medicines the morning  of surgery with A SIP OF WATER: duloxetine,amlodipine,omeprazole.Use inhalers as usual.Tylenol,hydroxyzine as needed.  How to Manage Your Diabetes Before and After Surgery  Why is it important to control my blood sugar before and after surgery? Improving blood sugar levels before and after surgery helps healing and can limit problems. A way of improving blood sugar control is eating a healthy diet by:  Eating less sugar and carbohydrates  Increasing activity/exercise  Talking with your doctor about reaching your blood sugar goals High blood sugars (greater than 180 mg/dL) can raise your risk of infections and slow your recovery, so you will need to focus on controlling your diabetes during the weeks before surgery. Make sure that the doctor who takes care of your diabetes knows about your planned surgery including the date and location.  How do I manage my blood sugar before surgery? Check your blood sugar at least 4 times a day, starting 2 days before surgery, to make sure that the level is not too high or low. Check your blood sugar the morning of your surgery when you wake up and every 2 hours until you get to the Short Stay unit. If your blood sugar is less than 70 mg/dL, you will need to treat for low blood sugar: Do not take insulin. Treat a low blood sugar (less than 70 mg/dL) with  cup of clear juice (cranberry or apple), 4 glucose tablets, OR glucose gel. Recheck blood sugar in 15 minutes after treatment (to make sure it is greater than 70 mg/dL). If your blood sugar is not greater than 70 mg/dL on recheck, call [phone] for further instructions. Report your blood sugar to the short stay nurse when you get to Short  Stay.  If you are admitted to the hospital after surgery: Your blood sugar will be checked by the staff and you will probably be given insulin after surgery (instead of oral diabetes medicines) to make sure you have good blood sugar levels. The goal for blood sugar control after surgery is 80-180 mg/dL.   WHAT DO I DO ABOUT MY DIABETES MEDICATION?  DO NOT TAKE THE FOLLOWING 7 DAYS PRIOR TO SURGERY: Ozempic, Wegovy, Rybelsus (Semaglutide), Byetta (exenatide), Bydureon (exenatide ER), Victoza, Saxenda (liraglutide), or Trulicity (dulaglutide) Mounjaro (Tirzepatide) Adlyxin (Lixisenatide), Polyethylene Glycol Loxenatide.  Bring CPAP mask and tubing day of surgery.                              You may not have any metal on your body including hair pins, jewelry, and body piercing             Do not wear make-up, lotions, powders, perfumes/cologne, or deodorant  Do not wear nail polish including gel and S&S, artificial/acrylic nails, or any other type of covering on natural nails including finger and toenails. If you have artificial nails, gel coating, etc. that needs to be removed by a nail salon please have this removed prior to surgery or surgery may need to be canceled/ delayed if the surgeon/ anesthesia feels like they are unable to be safely monitored.   Do not shave  48 hours prior to surgery.    Do not bring valuables to the hospital. [place].   Contacts, glasses, or bridgework may not be worn into surgery.   Bring small overnight bag day of surgery.  DO NOT [place]. PHARMACY WILL DISPENSE MEDICATIONS LISTED ON YOUR MEDICATION LIST TO YOU DURING YOUR ADMISSION [place]!    Patients discharged on the day of surgery will not be allowed to drive home.  Someone NEEDS to stay with you for the first 24 hours after anesthesia.   Special Instructions: Bring a copy of your healthcare power of  attorney and living will documents         the day of surgery if you haven't scanned them before.              Please read over the following fact sheets you were given: IF YOU HAVE QUESTIONS ABOUT YOUR PRE-OP INSTRUCTIONS PLEASE CALL [phone]    [facility] - Preparing for Surgery Before surgery, you can play an important role.  Because skin is not sterile, your skin needs to be as free of germs as possible.  You can reduce the number of germs on your skin by washing with CHG (chlorahexidine gluconate) soap before surgery.  CHG is an antiseptic cleaner which kills germs and bonds with the skin to continue killing germs even after washing. Please DO NOT use if you have an allergy to CHG or antibacterial soaps.  If your skin becomes reddened/irritated stop using the CHG and inform your nurse when you arrive at Short Stay. Do not shave (including legs and underarms) for at least 48 hours prior to the first CHG shower.  You may shave your face/neck. Please follow these instructions carefully:  1.  Shower with CHG Soap the night before surgery and the  morning of Surgery.  2.  If you choose to wash your hair, wash your hair first as usual with your  normal  shampoo.  3.  After you shampoo, rinse your hair and body thoroughly to remove the  shampoo.                           4.  Use CHG as you would any other liquid soap.  You can apply chg directly  to the skin and wash                       Gently with a scrungie or clean washcloth.  5.  Apply the CHG Soap to your body ONLY FROM THE NECK DOWN.   Do not use on face/ open                           Wound or open sores. Avoid contact with eyes, ears mouth and genitals (private parts).                       Wash face,  Genitals (private parts) with your normal soap.             6.  Wash thoroughly, paying special attention to the area where your surgery  will be performed.  7.  Thoroughly rinse your body with warm water from the neck down.  8.  DO NOT  shower/wash with your normal soap after using and rinsing off  the CHG Soap.                9.  Pat yourself dry with a clean towel.            10.  Wear clean pajamas.  11.  Place clean sheets on your bed the night of your first shower and do not  sleep with pets. Day of Surgery : Do not apply any lotions/deodorants the morning of surgery.  Please wear clean clothes to the hospital/surgery center.  FAILURE TO FOLLOW THESE INSTRUCTIONS MAY RESULT IN THE CANCELLATION OF YOUR SURGERY PATIENT SIGNATURE_________________________________  NURSE SIGNATURE__________________________________  ________________________________________________________________________[facility]- Preparing for Total Shoulder Arthroplasty    Before surgery, you can play an important role. Because skin is not sterile, your skin needs to be as free of germs as possible. You can reduce the number of germs on your skin by using the following products. Benzoyl Peroxide Gel Reduces the number of germs present on the skin Applied twice a day to shoulder area starting two days before surgery    ==================================================================  Please follow these instructions carefully:  BENZOYL PEROXIDE 5% GEL  Please do not use if you have an allergy to benzoyl peroxide.   If your skin becomes reddened/irritated stop using the benzoyl peroxide.  Starting two days before surgery, apply as follows: Apply benzoyl peroxide in the morning and at night. Apply after taking a shower. If you are not taking a shower clean entire shoulder front, back, and side along with the armpit with a clean wet washcloth.  Place a quarter-sized dollop on your shoulder and rub in thoroughly, making sure to cover the front, back, and side of your shoulder, along with the armpit.   2 days before ____ AM   ____ PM              1 day before ____ AM   ____ PM                         Do this twice a day for two days.   (Last application is the night before surgery, AFTER using the CHG soap as described below).  Do NOT apply benzoyl peroxide gel on the day of surgery.  Incentive Spirometer  An incentive spirometer is a tool that can help keep your lungs clear and active. This tool measures how well you are filling your lungs with each breath. Taking long deep breaths may help reverse or decrease the chance of developing breathing (pulmonary) problems (especially infection) following: A long period of time when you are unable to move or be active. BEFORE THE PROCEDURE  If the spirometer includes an indicator to show your best effort, your nurse or respiratory therapist will set it to a desired goal. If possible, sit up straight or lean slightly forward. Try not to slouch. Hold the incentive spirometer in an upright position. INSTRUCTIONS FOR USE  Sit on the edge of your bed if possible, or sit up as far as you can in bed or on a chair. Hold the incentive spirometer in an upright position. Breathe out normally. Place the mouthpiece in your mouth and seal your lips tightly around it. Breathe in slowly and as deeply as possible, raising the piston or the ball toward the top of the column. Hold your breath for 3-5 seconds or for as long as possible. Allow the piston or ball to fall to the bottom of the column. Remove the mouthpiece from your mouth and breathe out normally. Rest for a few seconds and repeat Steps 1 through 7 at least 10 times every 1-2 hours when you are awake. Take your time and take a few normal breaths between deep breaths. The spirometer may  include an indicator to show your best effort. Use the indicator as a goal to work toward during each repetition. After each set of 10 deep breaths, practice coughing to be sure your lungs are clear. If you have an incision (the cut made at the time of surgery), support your incision when coughing by placing a pillow or rolled up towels firmly against  it. Once you are able to get out of bed, walk around indoors and cough well. You may stop using the incentive spirometer when instructed by your caregiver.  RISKS AND COMPLICATIONS Take your time so you do not get dizzy or light-headed. If you are in pain, you may need to take or ask for pain medication before doing incentive spirometry. It is harder to take a deep breath if you are having pain. AFTER USE Rest and breathe slowly and easily. It can be helpful to keep track of a log of your progress. Your caregiver can provide you with a simple table to help with this. If you are using the spirometer at home, follow these instructions: [place] IF:  You are having difficultly using the spirometer. You have trouble using the spirometer as often as instructed. Your pain medication is not giving enough relief while using the spirometer. You develop fever of 100.5 F (38.1 C) or higher. SEEK IMMEDIATE MEDICAL CARE IF:  You cough up bloody sputum that had not been present before. You develop fever of 102 F (38.9 C) or greater. You develop worsening pain at or near the incision site. MAKE SURE YOU:  Understand these instructions. Will watch your condition. Will get help right away if you are not doing well or get worse. Document Released: 12/30/2006 Document Revised: 11/11/2011 Document Reviewed: 03/02/2007 [facility] Patient Information 2014 [place], [place].   ________________________________________________________________________

## 2022-09-05 ENCOUNTER — Encounter (HOSPITAL_COMMUNITY): Payer: Self-pay

## 2022-09-05 ENCOUNTER — Encounter (HOSPITAL_COMMUNITY)
Admission: RE | Admit: 2022-09-05 | Discharge: 2022-09-05 | Disposition: A | Discharge status: Home / Self Care | Payer: PPO | Source: Ambulatory Visit | Attending: Orthopedic Surgery | Admitting: Orthopedic Surgery

## 2022-09-05 ENCOUNTER — Other Ambulatory Visit: Payer: Self-pay

## 2022-09-05 VITALS — BP 148/90 | HR 103 | Temp 98.7°F | Ht 61.0 in | Wt 220.0 lb

## 2022-09-05 DIAGNOSIS — Z01818 Encounter for other preprocedural examination: Secondary | ICD-10-CM | POA: Insufficient documentation

## 2022-09-05 DIAGNOSIS — I498 Other specified cardiac arrhythmias: Secondary | ICD-10-CM | POA: Insufficient documentation

## 2022-09-05 DIAGNOSIS — R7303 Prediabetes: Secondary | ICD-10-CM | POA: Insufficient documentation

## 2022-09-05 DIAGNOSIS — I2510 Atherosclerotic heart disease of native coronary artery without angina pectoris: Secondary | ICD-10-CM | POA: Diagnosis not present

## 2022-09-05 LAB — CBC
HCT: 44.6 % (ref 36.0–46.0)
Hemoglobin: 13.9 g/dL (ref 12.0–15.0)
MCH: 28.8 pg (ref 26.0–34.0)
MCHC: 31.2 g/dL (ref 30.0–36.0)
MCV: 92.5 fL (ref 80.0–100.0)
Platelets: 227 10*3/uL (ref 150–400)
RBC: 4.82 MIL/uL (ref 3.87–5.11)
RDW: 15.9 % — ABNORMAL HIGH (ref 11.5–15.5)
WBC: 9.3 10*3/uL (ref 4.0–10.5)
nRBC: 0 % (ref 0.0–0.2)

## 2022-09-05 LAB — GLUCOSE, CAPILLARY: Glucose-Capillary: 137 mg/dL — ABNORMAL HIGH (ref 70–99)

## 2022-09-05 LAB — BASIC METABOLIC PANEL
Anion gap: 10 (ref 5–15)
BUN: 15 mg/dL (ref 8–23)
CO2: 27 mmol/L (ref 22–32)
Calcium: 9.6 mg/dL (ref 8.9–10.3)
Chloride: 103 mmol/L (ref 98–111)
Creatinine, Ser: 0.8 mg/dL (ref 0.44–1.00)
GFR, Estimated: >60 mL/min (ref >60)
Glucose, Bld: 120 mg/dL — ABNORMAL HIGH (ref 70–99)
Potassium: 4.5 mmol/L (ref 3.5–5.1)
Sodium: 140 mmol/L (ref 135–145)

## 2022-09-05 LAB — SURGICAL PCR SCREEN
MRSA, PCR: NEGATIVE
Staphylococcus aureus: NEGATIVE

## 2022-09-05 NOTE — Progress Notes (Addendum)
For Short Stay: [place] appointment date:  Bowel Prep reminder:   For Anesthesia: PCP - Dr. Dineen Kid Cardiologist - N/A  Chest x-ray -  EKG -  Stress Test -  ECHO -  Cardiac Cath -  Pacemaker/ICD device last checked: Pacemaker orders received: Device Rep notified:  Spinal Cord Stimulator:  Sleep Study - Yes CPAP - NO  Fasting Blood Sugar - N/A Checks Blood Sugar ___0__ times a day Date and result of last Hgb A1c- 6.1: 06/14/22  Last dose of GLP1 agonist- will be 09/08/22 GLP1 instructions: To hold it after next dose.  Last dose of SGLT-2 inhibitors-  SGLT-2 instructions:   Blood Thinner Instructions: Aspirin Instructions: Last Dose:  Activity level: Can go up a flight of stairs and activities of daily living without stopping and without chest pain and/or shortness of breath   Able to exercise without chest pain and/or shortness of breath    Anesthesia review: Hx:DVT,HTN,Pre-DIA.  Patient denies shortness of breath, fever, cough and chest pain at PAT appointment   Patient verbalized understanding of instructions that were given to them at the PAT appointment. Patient was also instructed that they will need to review over the PAT instructions again at home before surgery.

## 2022-09-06 LAB — HEMOGLOBIN A1C
Hgb A1c MFr Bld: 6.2 % — ABNORMAL HIGH (ref 4.8–5.6)
Mean Plasma Glucose: 131 mg/dL

## 2022-09-15 NOTE — Anesthesia Preprocedure Evaluation (Signed)
Anesthesia Evaluation  Patient identified by MRN, date of birth, ID band Patient awake    Reviewed: Allergy & Precautions, NPO status , Patient's Chart, lab work & pertinent test results  History of Anesthesia Complications (+) PONV and history of anesthetic complications  Airway Mallampati: II  TM Distance: >3 FB Neck ROM: Full    Dental  (+) Dental Advisory Given, Lower Dentures, Upper Dentures   Pulmonary asthma , former smoker   Pulmonary exam normal breath sounds clear to auscultation       Cardiovascular hypertension, Pt. on medications Normal cardiovascular exam Rhythm:Regular Rate:Normal     Neuro/Psych  PSYCHIATRIC DISORDERS Anxiety Depression     Neuromuscular disease    GI/Hepatic Neg liver ROS,GERD  Medicated and Controlled,,  Endo/Other    Morbid obesity  Renal/GU negative Renal ROS     Musculoskeletal  (+) Arthritis ,  Fibromyalgia -  Abdominal   Peds  Hematology negative hematology ROS (+)   Anesthesia Other Findings   Reproductive/Obstetrics                             Anesthesia Physical Anesthesia Plan  ASA: 3  Anesthesia Plan: General   Post-op Pain Management: Regional block* and Tylenol PO (pre-op)*   Induction: Intravenous  PONV Risk Score and Plan: 4 or greater and Propofol infusion, Dexamethasone and Ondansetron  Airway Management Planned: Oral ETT  Additional Equipment:   Intra-op Plan:   Post-operative Plan: Extubation in OR  Informed Consent: I have reviewed the patients History and Physical, chart, labs and discussed the procedure including the risks, benefits and alternatives for the proposed anesthesia with the patient or authorized representative who has indicated his/her understanding and acceptance.     Dental advisory given  Plan Discussed with: CRNA  Anesthesia Plan Comments:        Anesthesia Quick Evaluation

## 2022-09-16 ENCOUNTER — Other Ambulatory Visit: Payer: Self-pay

## 2022-09-16 ENCOUNTER — Encounter (HOSPITAL_COMMUNITY)
Admission: RE | Disposition: A | Discharge status: Home / Self Care | Payer: Self-pay | Source: Home / Self Care | Attending: Orthopedic Surgery

## 2022-09-16 ENCOUNTER — Encounter (HOSPITAL_COMMUNITY): Payer: Self-pay | Admitting: Orthopedic Surgery

## 2022-09-16 ENCOUNTER — Ambulatory Visit (HOSPITAL_COMMUNITY): Payer: PPO | Admitting: Anesthesiology

## 2022-09-16 ENCOUNTER — Ambulatory Visit (HOSPITAL_COMMUNITY): Payer: PPO

## 2022-09-16 ENCOUNTER — Observation Stay (HOSPITAL_COMMUNITY)
Admission: RE | Admit: 2022-09-16 | Discharge: 2022-09-17 | Disposition: A | Discharge status: Home / Self Care | Payer: PPO | Attending: Orthopedic Surgery | Admitting: Orthopedic Surgery

## 2022-09-16 DIAGNOSIS — Z87891 Personal history of nicotine dependence: Secondary | ICD-10-CM

## 2022-09-16 DIAGNOSIS — Z96643 Presence of artificial hip joint, bilateral: Secondary | ICD-10-CM | POA: Diagnosis not present

## 2022-09-16 DIAGNOSIS — I10 Essential (primary) hypertension: Secondary | ICD-10-CM

## 2022-09-16 DIAGNOSIS — Z79899 Other long term (current) drug therapy: Secondary | ICD-10-CM | POA: Insufficient documentation

## 2022-09-16 DIAGNOSIS — J45909 Unspecified asthma, uncomplicated: Secondary | ICD-10-CM | POA: Diagnosis not present

## 2022-09-16 DIAGNOSIS — Z86718 Personal history of other venous thrombosis and embolism: Secondary | ICD-10-CM | POA: Insufficient documentation

## 2022-09-16 DIAGNOSIS — Z96651 Presence of right artificial knee joint: Secondary | ICD-10-CM | POA: Diagnosis not present

## 2022-09-16 DIAGNOSIS — M19012 Primary osteoarthritis, left shoulder: Secondary | ICD-10-CM

## 2022-09-16 DIAGNOSIS — Z96612 Presence of left artificial shoulder joint: Principal | ICD-10-CM

## 2022-09-16 DIAGNOSIS — Z96611 Presence of right artificial shoulder joint: Secondary | ICD-10-CM | POA: Insufficient documentation

## 2022-09-16 HISTORY — PX: REVERSE SHOULDER ARTHROPLASTY: SHX5054

## 2022-09-16 LAB — CREATININE, SERUM
Creatinine, Ser: 0.73 mg/dL (ref 0.44–1.00)
GFR, Estimated: >60 mL/min (ref >60)

## 2022-09-16 LAB — CBC
HCT: 44.1 % (ref 36.0–46.0)
Hemoglobin: 13.4 g/dL (ref 12.0–15.0)
MCH: 28.6 pg (ref 26.0–34.0)
MCHC: 30.4 g/dL (ref 30.0–36.0)
MCV: 94 fL (ref 80.0–100.0)
Platelets: 214 10*3/uL (ref 150–400)
RBC: 4.69 MIL/uL (ref 3.87–5.11)
RDW: 15.9 % — ABNORMAL HIGH (ref 11.5–15.5)
WBC: 10.5 10*3/uL (ref 4.0–10.5)
nRBC: 0 % (ref 0.0–0.2)

## 2022-09-16 LAB — GLUCOSE, CAPILLARY
Glucose-Capillary: 112 mg/dL — ABNORMAL HIGH (ref 70–99)
Glucose-Capillary: 118 mg/dL — ABNORMAL HIGH (ref 70–99)

## 2022-09-16 SURGERY — ARTHROPLASTY, SHOULDER, TOTAL, REVERSE
Anesthesia: Regional | Site: Shoulder | Laterality: Left

## 2022-09-16 MED ORDER — ACETAMINOPHEN 500 MG PO TABS: 1000.0000 mg | ORAL_TABLET | Freq: Once | ORAL | Status: AC

## 2022-09-16 MED ORDER — HYDROMORPHONE HCL 1 MG/ML IJ SOLN: 0.5000 mg | INTRAMUSCULAR | Status: DC | PRN

## 2022-09-16 MED ORDER — FENTANYL CITRATE (PF) 250 MCG/5ML IJ SOLN
INTRAMUSCULAR | Status: DC | PRN
  Administered 2022-09-16 (×2): 50 ug via INTRAVENOUS

## 2022-09-16 MED ORDER — PROPOFOL 10 MG/ML IV BOLUS
INTRAVENOUS | Status: DC | PRN
  Administered 2022-09-16: 140 mg via INTRAVENOUS
  Administered 2022-09-16: 10 mg via INTRAVENOUS
  Administered 2022-09-16: 20 mg via INTRAVENOUS
  Administered 2022-09-16: 10 mg via INTRAVENOUS

## 2022-09-16 MED ORDER — VANCOMYCIN HCL 1000 MG IV SOLR
INTRAVENOUS | Status: DC | PRN
  Administered 2022-09-16: 1000 mg via TOPICAL

## 2022-09-16 MED ORDER — METOCLOPRAMIDE HCL 5 MG/ML IJ SOLN: 5.0000 mg | Freq: Three times a day (TID) | INTRAMUSCULAR | Status: DC | PRN

## 2022-09-16 MED ORDER — ACETAMINOPHEN 500 MG PO TABS
1000.0000 mg | ORAL_TABLET | Freq: Four times a day (QID) | ORAL | Status: AC
  Administered 2022-09-16 – 2022-09-17 (×3): 1000 mg via ORAL
  Filled 2022-09-16 (×4): qty 2

## 2022-09-16 MED ORDER — STERILE WATER FOR IRRIGATION IR SOLN
Status: DC | PRN
  Administered 2022-09-16: 2000 mL

## 2022-09-16 MED ORDER — ENOXAPARIN SODIUM 30 MG/0.3ML IJ SOSY
30.0000 mg | PREFILLED_SYRINGE | INTRAMUSCULAR | Status: DC
  Administered 2022-09-17: 30 mg via SUBCUTANEOUS
  Filled 2022-09-16: qty 0.3

## 2022-09-16 MED ORDER — CEFAZOLIN SODIUM-DEXTROSE 2-3 GM-%(50ML) IV SOLR: INTRAVENOUS | Status: DC | PRN

## 2022-09-16 MED ORDER — ONDANSETRON HCL 4 MG/2ML IJ SOLN
INTRAMUSCULAR | Status: AC
  Filled 2022-09-16: qty 2

## 2022-09-16 MED ORDER — MENTHOL 3 MG MT LOZG: 1.0000 | LOZENGE | OROMUCOSAL | Status: DC | PRN

## 2022-09-16 MED ORDER — DEXAMETHASONE SODIUM PHOSPHATE 10 MG/ML IJ SOLN
INTRAMUSCULAR | Status: AC
  Filled 2022-09-16: qty 1

## 2022-09-16 MED ORDER — DEXAMETHASONE SODIUM PHOSPHATE 10 MG/ML IJ SOLN
INTRAMUSCULAR | Status: DC | PRN
  Administered 2022-09-16: 5 mg via INTRAVENOUS

## 2022-09-16 MED ORDER — FENTANYL CITRATE (PF) 100 MCG/2ML IJ SOLN
INTRAMUSCULAR | Status: AC
  Filled 2022-09-16: qty 2

## 2022-09-16 MED ORDER — ALBUTEROL SULFATE (2.5 MG/3ML) 0.083% IN NEBU: 2.5000 mg | INHALATION_SOLUTION | Freq: Four times a day (QID) | RESPIRATORY_TRACT | Status: DC | PRN

## 2022-09-16 MED ORDER — SUGAMMADEX SODIUM 200 MG/2ML IV SOLN
INTRAVENOUS | Status: DC | PRN
  Administered 2022-09-16: 200 mg via INTRAVENOUS

## 2022-09-16 MED ORDER — OXYCODONE HCL 5 MG PO TABS
ORAL_TABLET | ORAL | Status: AC
  Filled 2022-09-16: qty 2

## 2022-09-16 MED ORDER — METOCLOPRAMIDE HCL 5 MG PO TABS: 5.0000 mg | ORAL_TABLET | Freq: Three times a day (TID) | ORAL | Status: DC | PRN

## 2022-09-16 MED ORDER — OXYCODONE HCL 5 MG PO TABS
5.0000 mg | ORAL_TABLET | ORAL | Status: DC | PRN
  Administered 2022-09-16 – 2022-09-17 (×2): 10 mg via ORAL
  Filled 2022-09-16: qty 2

## 2022-09-16 MED ORDER — ATORVASTATIN CALCIUM 20 MG PO TABS
20.0000 mg | ORAL_TABLET | Freq: Every evening | ORAL | Status: DC
  Administered 2022-09-16: 20 mg via ORAL
  Filled 2022-09-16: qty 1

## 2022-09-16 MED ORDER — FENTANYL CITRATE PF 50 MCG/ML IJ SOSY
PREFILLED_SYRINGE | INTRAMUSCULAR | Status: AC
  Filled 2022-09-16: qty 1

## 2022-09-16 MED ORDER — ALBUTEROL SULFATE HFA 108 (90 BASE) MCG/ACT IN AERS: 1.0000 | INHALATION_SPRAY | Freq: Four times a day (QID) | RESPIRATORY_TRACT | Status: DC | PRN

## 2022-09-16 MED ORDER — LACTATED RINGERS IV SOLN: INTRAVENOUS | Status: DC

## 2022-09-16 MED ORDER — OXYCODONE HCL 5 MG PO TABS: 10.0000 mg | ORAL_TABLET | ORAL | Status: DC | PRN

## 2022-09-16 MED ORDER — BUPIVACAINE LIPOSOME 1.3 % IJ SUSP
INTRAMUSCULAR | Status: DC | PRN
  Administered 2022-09-16: 10 mL via PERINEURAL

## 2022-09-16 MED ORDER — ONDANSETRON HCL 4 MG PO TABS: 4.0000 mg | ORAL_TABLET | Freq: Four times a day (QID) | ORAL | Status: DC | PRN

## 2022-09-16 MED ORDER — ONDANSETRON HCL 4 MG/2ML IJ SOLN
INTRAMUSCULAR | Status: DC | PRN
  Administered 2022-09-16: 4 mg via INTRAVENOUS

## 2022-09-16 MED ORDER — CEFAZOLIN SODIUM-DEXTROSE 2-4 GM/100ML-% IV SOLN
INTRAVENOUS | Status: AC
  Filled 2022-09-16: qty 100

## 2022-09-16 MED ORDER — PANTOPRAZOLE SODIUM 40 MG PO TBEC
40.0000 mg | DELAYED_RELEASE_TABLET | Freq: Every day | ORAL | Status: DC
  Administered 2022-09-17: 40 mg via ORAL
  Filled 2022-09-16: qty 1

## 2022-09-16 MED ORDER — ROPIVACAINE HCL 5 MG/ML IJ SOLN
INTRAMUSCULAR | Status: DC | PRN
  Administered 2022-09-16: 15 mL via PERINEURAL

## 2022-09-16 MED ORDER — DULOXETINE HCL 60 MG PO CPEP
60.0000 mg | ORAL_CAPSULE | Freq: Two times a day (BID) | ORAL | Status: DC
  Administered 2022-09-16 – 2022-09-17 (×2): 60 mg via ORAL
  Filled 2022-09-16 (×2): qty 1

## 2022-09-16 MED ORDER — ORAL CARE MOUTH RINSE: 15.0000 mL | Freq: Once | OROMUCOSAL | Status: AC

## 2022-09-16 MED ORDER — ROCURONIUM BROMIDE 10 MG/ML (PF) SYRINGE
PREFILLED_SYRINGE | INTRAVENOUS | Status: AC
  Filled 2022-09-16: qty 10

## 2022-09-16 MED ORDER — LIDOCAINE HCL (PF) 2 % IJ SOLN
INTRAMUSCULAR | Status: AC
  Filled 2022-09-16: qty 5

## 2022-09-16 MED ORDER — ISOPROPYL ALCOHOL 70 % SOLN
Status: AC
  Filled 2022-09-16: qty 480

## 2022-09-16 MED ORDER — MELATONIN 5 MG PO TABS: 5.0000 mg | ORAL_TABLET | Freq: Every evening | ORAL | Status: DC | PRN

## 2022-09-16 MED ORDER — ARMODAFINIL 150 MG PO TABS: 150.0000 mg | ORAL_TABLET | Freq: Every day | ORAL | Status: DC

## 2022-09-16 MED ORDER — IRBESARTAN 75 MG PO TABS
75.0000 mg | ORAL_TABLET | Freq: Every day | ORAL | Status: DC
  Administered 2022-09-17: 75 mg via ORAL
  Filled 2022-09-16: qty 1

## 2022-09-16 MED ORDER — VANCOMYCIN HCL 1000 MG IV SOLR
INTRAVENOUS | Status: AC
  Filled 2022-09-16: qty 20

## 2022-09-16 MED ORDER — PHENYLEPHRINE HCL-NACL 20-0.9 MG/250ML-% IV SOLN
INTRAVENOUS | Status: DC | PRN
  Administered 2022-09-16: 40 ug/min via INTRAVENOUS

## 2022-09-16 MED ORDER — VITAMIN D3 25 MCG (1000 UNIT) PO TABS
10000.0000 [IU] | ORAL_TABLET | ORAL | Status: DC
  Filled 2022-09-16: qty 10

## 2022-09-16 MED ORDER — ACETAMINOPHEN 325 MG PO TABS: 325.0000 mg | ORAL_TABLET | Freq: Four times a day (QID) | ORAL | Status: DC | PRN

## 2022-09-16 MED ORDER — FENTANYL CITRATE PF 50 MCG/ML IJ SOSY
25.0000 ug | PREFILLED_SYRINGE | INTRAMUSCULAR | Status: DC | PRN
  Administered 2022-09-16: 50 ug via INTRAVENOUS

## 2022-09-16 MED ORDER — PHENOL 1.4 % MT LIQD: 1.0000 | OROMUCOSAL | Status: DC | PRN

## 2022-09-16 MED ORDER — HYDROXYZINE HCL 25 MG PO TABS: 25.0000 mg | ORAL_TABLET | Freq: Three times a day (TID) | ORAL | Status: DC | PRN

## 2022-09-16 MED ORDER — TRANEXAMIC ACID-NACL 1000-0.7 MG/100ML-% IV SOLN
1000.0000 mg | Freq: Once | INTRAVENOUS | Status: AC
  Administered 2022-09-16: 1000 mg via INTRAVENOUS
  Filled 2022-09-16: qty 100

## 2022-09-16 MED ORDER — PHENYLEPHRINE 80 MCG/ML (10ML) SYRINGE FOR IV PUSH (FOR BLOOD PRESSURE SUPPORT)
PREFILLED_SYRINGE | INTRAVENOUS | Status: AC
  Filled 2022-09-16: qty 10

## 2022-09-16 MED ORDER — CLINDAMYCIN PHOSPHATE 900 MG/50ML IV SOLN
900.0000 mg | INTRAVENOUS | Status: AC
  Administered 2022-09-16: 900 mg via INTRAVENOUS
  Filled 2022-09-16: qty 50

## 2022-09-16 MED ORDER — PROPOFOL 10 MG/ML IV BOLUS
INTRAVENOUS | Status: AC
  Filled 2022-09-16: qty 20

## 2022-09-16 MED ORDER — CLINDAMYCIN PHOSPHATE 600 MG/50ML IV SOLN
600.0000 mg | Freq: Four times a day (QID) | INTRAVENOUS | Status: AC
  Administered 2022-09-16 – 2022-09-17 (×3): 600 mg via INTRAVENOUS
  Filled 2022-09-16 (×3): qty 50

## 2022-09-16 MED ORDER — CHLORHEXIDINE GLUCONATE 0.12 % MT SOLN
15.0000 mL | Freq: Once | OROMUCOSAL | Status: AC
  Administered 2022-09-16: 15 mL via OROMUCOSAL

## 2022-09-16 MED ORDER — ONDANSETRON HCL 4 MG/2ML IJ SOLN: 4.0000 mg | Freq: Once | INTRAMUSCULAR | Status: DC | PRN

## 2022-09-16 MED ORDER — 0.9 % SODIUM CHLORIDE (POUR BTL) OPTIME
TOPICAL | Status: DC | PRN
  Administered 2022-09-16: 1000 mL

## 2022-09-16 MED ORDER — ONDANSETRON HCL 4 MG/2ML IJ SOLN: 4.0000 mg | Freq: Four times a day (QID) | INTRAMUSCULAR | Status: DC | PRN

## 2022-09-16 MED ORDER — ACETAMINOPHEN 500 MG PO TABS
ORAL_TABLET | ORAL | Status: AC
  Administered 2022-09-16: 1000 mg via ORAL
  Filled 2022-09-16: qty 1

## 2022-09-16 MED ORDER — TRANEXAMIC ACID-NACL 1000-0.7 MG/100ML-% IV SOLN
1000.0000 mg | INTRAVENOUS | Status: AC
  Administered 2022-09-16: 1000 mg via INTRAVENOUS
  Filled 2022-09-16: qty 100

## 2022-09-16 MED ORDER — ORAL CARE MOUTH RINSE: 15.0000 mL | OROMUCOSAL | Status: DC | PRN

## 2022-09-16 MED ORDER — LIDOCAINE 2% (20 MG/ML) 5 ML SYRINGE
INTRAMUSCULAR | Status: DC | PRN
  Administered 2022-09-16: 80 mg via INTRAVENOUS

## 2022-09-16 MED ORDER — AMLODIPINE BESYLATE 5 MG PO TABS: 5.0000 mg | ORAL_TABLET | Freq: Every evening | ORAL | Status: DC

## 2022-09-16 MED ORDER — DOCUSATE SODIUM 100 MG PO CAPS
100.0000 mg | ORAL_CAPSULE | Freq: Two times a day (BID) | ORAL | Status: DC
  Administered 2022-09-16 – 2022-09-17 (×2): 100 mg via ORAL
  Filled 2022-09-16 (×2): qty 1

## 2022-09-16 MED ORDER — ROCURONIUM BROMIDE 10 MG/ML (PF) SYRINGE
PREFILLED_SYRINGE | INTRAVENOUS | Status: DC | PRN
  Administered 2022-09-16: 60 mg via INTRAVENOUS

## 2022-09-16 SURGICAL SUPPLY — 73 items
AID PSTN UNV HD RSTRNT DISP (MISCELLANEOUS) ×1
AUG COMP REV MI TAPER ADAPTER (Joint) ×1 IMPLANT
AUGMENT COMP REV MI TAPR ADPTR (Joint) IMPLANT
BAG COUNTER SPONGE SURGICOUNT (BAG) IMPLANT
BAG SPEC THK2 15X12 ZIP CLS (MISCELLANEOUS) ×1
BAG SPNG CNTER NS LX DISP (BAG)
BAG ZIPLOCK 12X15 (MISCELLANEOUS) ×1 IMPLANT
BEARING HUMERAL SHLDER 36M STD (Shoulder) IMPLANT
BIT DRILL 2.7 W/STOP DISP (BIT) IMPLANT
BIT DRILL TWIST 2.7 (BIT) IMPLANT
BLADE SAG 18X100X1.27 (BLADE) ×1 IMPLANT
BRNG HUM STD 36 RVRS SHLDR (Shoulder) ×1 IMPLANT
BSPLAT GLND SM AUG TPR ADPR (Joint) ×1 IMPLANT
CEMENT BONE DEPUY (Cement) IMPLANT
CLSR STERI-STRIP ANTIMIC 1/2X4 (GAUZE/BANDAGES/DRESSINGS) IMPLANT
COVER BACK TABLE 60X90IN (DRAPES) ×1 IMPLANT
COVER SURGICAL LIGHT HANDLE (MISCELLANEOUS) ×1 IMPLANT
DRAPE ORTHO SPLIT 77X108 STRL (DRAPES) ×2
DRAPE SHEET LG 3/4 BI-LAMINATE (DRAPES) ×1 IMPLANT
DRAPE SURG 17X11 SM STRL (DRAPES) ×1 IMPLANT
DRAPE SURG ORHT 6 SPLT 77X108 (DRAPES) ×2 IMPLANT
DRAPE TOP 10253 STERILE (DRAPES) ×1 IMPLANT
DRAPE U-SHAPE 47X51 STRL (DRAPES) ×1 IMPLANT
DRSG AQUACEL AG ADV 3.5X 6 (GAUZE/BANDAGES/DRESSINGS) IMPLANT
DRSG AQUACEL AG ADV 3.5X10 (GAUZE/BANDAGES/DRESSINGS) ×1 IMPLANT
DURAPREP 26ML APPLICATOR (WOUND CARE) IMPLANT
ELECT REM PT RETURN 15FT ADLT (MISCELLANEOUS) ×1 IMPLANT
FACESHIELD WRAPAROUND (MASK) ×1 IMPLANT
FACESHIELD WRAPAROUND OR TEAM (MASK) ×1 IMPLANT
GLENOID SPHERE 36MM CVD +3 (Orthopedic Implant) IMPLANT
GLOVE BIO SURGEON STRL SZ7.5 (GLOVE) ×4 IMPLANT
GLOVE BIOGEL PI IND STRL 8 (GLOVE) ×2 IMPLANT
GOWN STRL REUS W/ TWL XL LVL3 (GOWN DISPOSABLE) ×2 IMPLANT
GOWN STRL REUS W/TWL XL LVL3 (GOWN DISPOSABLE) ×2
KIT BASIN OR (CUSTOM PROCEDURE TRAY) ×1 IMPLANT
KIT TURNOVER KIT A (KITS) IMPLANT
MANIFOLD NEPTUNE II (INSTRUMENTS) ×1 IMPLANT
NDL TAPERED W/ NITINOL LOOP (MISCELLANEOUS) IMPLANT
NEEDLE TAPERED W/ NITINOL LOOP (MISCELLANEOUS) IMPLANT
NS IRRIG 1000ML POUR BTL (IV SOLUTION) ×1 IMPLANT
PACK SHOULDER (CUSTOM PROCEDURE TRAY) ×1 IMPLANT
PAD CAST 4YDX4 CTTN HI CHSV (CAST SUPPLIES) ×1 IMPLANT
PADDING CAST COTTON 4X4 STRL (CAST SUPPLIES) ×1
PIN THREADED REVERSE (PIN) IMPLANT
REAMER GUIDE W/SCREW AUG (MISCELLANEOUS) IMPLANT
RESTRAINT HEAD UNIVERSAL NS (MISCELLANEOUS) IMPLANT
SCREW BONE LOCKING 4.75X40X3.5 (Screw) IMPLANT
SCREW BONE STRL 6.5MMX25MM (Screw) IMPLANT
SCREW LOCKING 4.75MMX15MM (Screw) IMPLANT
SCREW LOCKING NS 4.75MMX20MM (Screw) IMPLANT
SCREW LOCKING STRL 4.75X25X3.5 (Screw) IMPLANT
SHOULDER HUMERAL BEAR 36M STD (Shoulder) ×1 IMPLANT
SLING ARM FOAM STRAP MED (SOFTGOODS) IMPLANT
SLING ARM IMMOBILIZER MED (SOFTGOODS) IMPLANT
SMARTMIX MINI TOWER (MISCELLANEOUS)
SPONGE T-LAP 4X18 ~~LOC~~+RFID (SPONGE) IMPLANT
STEM HUMERAL STRL 11MMX83MM (Stem) IMPLANT
STRIP CLOSURE SKIN 1/2X4 (GAUZE/BANDAGES/DRESSINGS) ×1 IMPLANT
SUCTION FRAZIER HANDLE 10FR (MISCELLANEOUS) ×1
SUCTION TUBE FRAZIER 10FR DISP (MISCELLANEOUS) ×1 IMPLANT
SUT FIBERWIRE #2 38 T-5 BLUE (SUTURE)
SUT MNCRL AB 3-0 PS2 18 (SUTURE) ×1 IMPLANT
SUT VIC AB 0 CT1 36 (SUTURE) ×1 IMPLANT
SUT VIC AB 1 CT1 36 (SUTURE) ×1 IMPLANT
SUT VIC AB 2-0 CT1 27 (SUTURE) ×2
SUT VIC AB 2-0 CT1 TAPERPNT 27 (SUTURE) ×1 IMPLANT
SUTURE FIBERWR #2 38 T-5 BLUE (SUTURE) IMPLANT
SUTURE TAPE 1.3 40 TPR END (SUTURE) ×2 IMPLANT
SUTURETAPE 1.3 40 TPR END (SUTURE) ×2
TOWEL OR 17X26 10 PK STRL BLUE (TOWEL DISPOSABLE) ×1 IMPLANT
TOWER SMARTMIX MINI (MISCELLANEOUS) IMPLANT
TRAY HUM REV SHOULDER STD +6 (Shoulder) IMPLANT
TUBE SUCTION HIGH CAP CLEAR NV (SUCTIONS) ×1 IMPLANT

## 2022-09-16 NOTE — Anesthesia Procedure Notes (Signed)
Anesthesia Regional Block: Interscalene brachial plexus block   Pre-Anesthetic Checklist: , timeout performed,  Correct Patient, Correct Site, Correct Laterality,  Correct Procedure, Correct Position, site marked,  Risks and benefits discussed,  Surgical consent,  Pre-op evaluation,  At surgeon's request and post-op pain management  Laterality: Left  Prep: chloraprep       Needles:  Injection technique: Single-shot  Needle Type: Echogenic Needle     Needle Length: 9cm  Needle Gauge: 21     Additional Needles:   Procedures:,,,, ultrasound used (permanent image in chart),,    Narrative:  Start time: 09/16/2022 6:55 AM End time: 09/16/2022 7:05 AM Injection made incrementally with aspirations every 5 mL.  Performed by: Personally  Anesthesiologist: Santa Lighter, MD  Additional Notes: No pain on injection. No increased resistance to injection. Injection made in 5cc increments.  Good needle visualization.  Patient tolerated procedure well.

## 2022-09-16 NOTE — Transfer of Care (Signed)
Immediate Anesthesia Transfer of Care Note  Patient: Evelyn Valdez  Procedure(s) Performed: REVERSE SHOULDER ARTHROPLASTY (Left: Shoulder)  Patient Location: PACU  Anesthesia Type:General and Regional  Level of Consciousness: awake and alert   Airway & Oxygen Therapy: Patient Spontanous Breathing and Patient connected to face mask oxygen  Post-op Assessment: Report given to RN and Post -op Vital signs reviewed and stable  Post vital signs: stable  Last Vitals:  Vitals Value Taken Time  BP 159/90 09/16/22 0912  Temp    Pulse 106 09/16/22 0915  Resp 21 09/16/22 0915  SpO2 100 % 09/16/22 0915  Vitals shown include unvalidated device data.  Last Pain:  Vitals:   09/16/22 0611  TempSrc: Oral  PainSc:       Patients Stated Pain Goal: 3 (11/20/20 4825)  Complications: No notable events documented.

## 2022-09-16 NOTE — Care Management CC44 (Signed)
Condition Code 44 Documentation Completed  Patient Details  Name: Evelyn Valdez MRN: [id number] Date of Birth: [birth year]   Condition Code 44 given:  Yes Patient signature on Condition Code 44 notice:  Yes Documentation of 2 MD's agreement:  Yes Code 44 added to claim:  Yes    Sherie Don, LCSW 09/16/2022, 4:03 PM

## 2022-09-16 NOTE — Brief Op Note (Signed)
09/16/2022  8:50 AM  PATIENT:  Evelyn Valdez  82 y.o. female  PRE-OPERATIVE DIAGNOSIS:  Left shoulder osteoarthritis  POST-OPERATIVE DIAGNOSIS:  Left shoulder osteoarthritis  PROCEDURE:  Procedure(s) with comments: REVERSE SHOULDER ARTHROPLASTY (Left) - 120  SURGEON:  Surgeon(s) and Role:    * Stann Mainland, Elly Modena, MD - Primary  PHYSICIAN ASSISTANT: Jonelle Sidle, PA-C  ANESTHESIA:   regional and general  EBL:  100 mL   BLOOD ADMINISTERED:none  DRAINS: none   LOCAL MEDICATIONS USED:  NONE  SPECIMEN:  No Specimen  DISPOSITION OF SPECIMEN:  N/A  COUNTS:  YES  TOURNIQUET:  * No tourniquets in log *  DICTATION: .Note written in EPIC  PLAN OF CARE: Admit to inpatient   PATIENT DISPOSITION:  PACU - hemodynamically stable.   Delay start of Pharmacological VTE agent (>24hrs) due to surgical blood loss or risk of bleeding: not applicable

## 2022-09-16 NOTE — H&P (Signed)
ORTHOPAEDIC H and p  REQUESTING PHYSICIAN: Nicholes Stairs, MD  PCP:  Kristopher Glee., MD  Chief Complaint: Left shoulder osteoarthritis  HPI: Evelyn Valdez is a 82 y.o. female who complains of left shoulder pain and dysfunction.  She is here today for reverse arthroplasty for advanced arthritis in the face of rotator cuff tear.  No new complaints.  She does have a history of the same surgery on the right.  Past Medical History:  Diagnosis Date   Anemia    Anxiety    Asthma    "well controlled"   Depression    Diverticulosis    Dizziness    DVT (deep venous thrombosis) (HCC)    right lower leg after knee surgery   Dyspnea    Fibromyalgia    High cholesterol    History of anemia    History of bronchitis    History of kidney stones    History of pneumonia    Hypertension    Insomnia    Localized osteoarthritis of right shoulder    Obesity    Osteoarthritis    Osteopenia    Peripheral neuropathy    takes Topamax   Pneumonia    hx of several times per patient    PONV (postoperative nausea and vomiting)    nothing recent    Pre-diabetes    Vitamin D deficiency    Wears glasses    Past Surgical History:  Procedure Laterality Date   ABDOMINAL HYSTERECTOMY     APPENDECTOMY     BACK SURGERY     BLADDER REPAIR     BREAST BIOPSY Right    x2   BREAST LUMPECTOMY Left    CHOLECYSTECTOMY     COLONOSCOPY W/ POLYPECTOMY     ESOPHAGOGASTRODUODENOSCOPY     EYE SURGERY Bilateral    cataracts   FOOT FRACTURE SURGERY Left    INCISION AND DRAINAGE HIP Left 05/14/2018   Procedure: IRRIGATION AND DEBRIDEMENT HIP;  Surgeon: Rod Can, MD;  Location: [facility];  Service: Orthopedics;  Laterality: Left;   KNEE ARTHROPLASTY Right    KNEE CARTILAGE SURGERY Right    x3   MENISCUS REPAIR Left    PLANTAR FASCIA SURGERY Bilateral    RECTOCELE REPAIR     REVERSE SHOULDER ARTHROPLASTY Right 09/25/2017   Procedure: RIGHT REVERSE SHOULDER ARTHROPLASTY;  Surgeon:  Nicholes Stairs, MD;  Location: [place];  Service: Orthopedics;  Laterality: Right;  2.5 hrs   SHOULDER ARTHROSCOPY     left  several surgeries   SHOULDER SURGERY Right    TOTAL HIP ARTHROPLASTY Right 06/24/2016   TOTAL HIP ARTHROPLASTY Right 06/24/2016   Procedure: RIGHT TOTAL HIP ARTHROPLASTY ANTERIOR APPROACH;  Surgeon: Rod Can, MD;  Location: [place];  Service: Orthopedics;  Laterality: Right;   TOTAL HIP ARTHROPLASTY Left 03/30/2018   Procedure: LEFT TOTAL HIP ARTHROPLASTY ANTERIOR APPROACH;  Surgeon: Rod Can, MD;  Location: [place];  Service: Orthopedics;  Laterality: Left;   TUBAL LIGATION     Social History   Socioeconomic History   Marital status: Married    Spouse name: Not on file   Number of children: Not on file   Years of education: Not on file   Highest education level: Not on file  Occupational History   Not on file  Tobacco Use   Smoking status: Former    Packs/day: 0.25    Years: 10.00    Total pack years: 2.50    Types: Cigarettes  Quit date: 6    Years since quitting: 50.0   Smokeless tobacco: Never  Vaping Use   Vaping Use: Never used  Substance and Sexual Activity   Alcohol use: No   Drug use: No   Sexual activity: Not on file  Other Topics Concern   Not on file  Social History Narrative   Not on file   Social Determinants of Health   Financial Resource Strain: Not on file  Food Insecurity: Not on file  Transportation Needs: Not on file  Physical Activity: Not on file  Stress: Not on file  Social Connections: Not on file   History reviewed. No pertinent family history. Allergies  Allergen Reactions   Penicillins Swelling, Rash, Other (See Comments) and Hives    PATIENT HAS HAD A PCN REACTION WITH IMMEDIATE RASH, FACIAL/TONGUE/THROAT SWELLING, SOB, OR LIGHTHEADEDNESS WITH HYPOTENSION:  #  #  #  YES  #  #  #   Has patient had a PCN reaction causing severe rash involving mucus membranes or skin necrosis: No PATIENT HAS HAD  A PCN REACTION THAT REQUIRED HOSPITALIZATION:  #  #  #  YES  #  #  #  Has patient had a PCN reaction occurring within the last 10 years: No     Adhesive [Tape] Other (See Comments)    Pulled skin off   Advair Diskus [Fluticasone-Salmeterol] Other (See Comments)    headaches   Lyrica [Pregabalin] Other (See Comments)    Weight gain   Nsaids     UNSPECIFIED REACTION - was told not to take by Dr    Vancomycin Itching    Receiving vancomycin infusion, about 45 minutes into infusion pt c/o itching, face and head (scalp) is bright red.     Baclofen Other (See Comments)    Patient doesn't feel herself on this medication    Erythromycin Nausea And Vomiting   Hydrocodone Nausea And Vomiting   Morphine And Related Nausea And Vomiting   Nickel Rash   Percocet [Oxycodone-Acetaminophen] Nausea And Vomiting   Sulfa Antibiotics Nausea And Vomiting and Rash   Sulfasalazine Rash   Prior to Admission medications   Medication Sig Start Date End Date Taking? Authorizing Provider  acetaminophen (TYLENOL) 500 MG tablet Take 1,000 mg by mouth every 6 (six) hours as needed for moderate pain or headache.   Yes [provider]  albuterol (PROVENTIL HFA;VENTOLIN HFA) 108 (90 Base) MCG/ACT inhaler Inhale 1-2 puffs into the lungs every 6 (six) hours as needed for wheezing or shortness of breath.   Yes [provider]  amLODipine (NORVASC) 5 MG tablet Take 5 mg by mouth every evening.  02/23/18  Yes [provider]  Armodafinil (NUVIGIL) 150 MG tablet Take 150 mg by mouth daily.   Yes [provider]  atorvastatin (LIPITOR) 20 MG tablet Take 20 mg by mouth every evening. 04/13/16  Yes [provider]  Biotin 5 MG TABS Take 5 mg by mouth daily.   Yes [provider]  Cholecalciferol (VITAMIN D3) 250 MCG (10000 UT) capsule Take 10,000 Units by mouth every other day.   Yes [provider]  DULoxetine (CYMBALTA) 60 MG capsule Take 60 mg by mouth 2 (two)  times daily. 05/22/16  Yes [provider]  HYDROmorphone (DILAUDID) 2 MG tablet Take 2 mg by mouth every 4 (four) hours as needed for severe pain.   Yes [provider]  hydrOXYzine (ATARAX) 25 MG tablet Take 25 mg by mouth 3 (three)  times daily as needed for anxiety or itching. 05/20/22  Yes [provider]  melatonin 5 MG TABS Take 5 mg by mouth at bedtime as needed (sleep).   Yes [provider]  meloxicam (MOBIC) 15 MG tablet Take 15 mg by mouth daily.   Yes [provider]  Menthol, Topical Analgesic, (BIOFREEZE) 4 % GEL Apply 1 Application topically daily as needed (pain).   Yes [provider]  nystatin cream (MYCOSTATIN) Apply 1 Application topically daily as needed for dry skin.   Yes [provider]  olmesartan (BENICAR) 40 MG tablet Take 40 mg by mouth daily.   Yes [provider]  omeprazole (PRILOSEC) 20 MG capsule Take 20 mg by mouth 2 (two) times daily.   Yes [provider]  Semaglutide,0.25 or 0.'5MG'$ /DOS, (OZEMPIC, 0.25 OR 0.5 MG/DOSE,) 2 MG/1.5ML SOPN Inject 0.25 mg into the skin every Sunday. 06/14/22  Yes [provider]  loperamide (IMODIUM A-D) 2 MG tablet Take 2 mg by mouth 4 (four) times daily as needed for diarrhea or loose stools.    [provider]  mupirocin cream (BACTROBAN) 2 % Apply 1 Application topically 2 (two) times daily as needed (wound care).    [provider]   No results found.  Positive ROS: All other systems have been reviewed and were otherwise negative with the exception of those mentioned in the HPI and as above.  Physical Exam: General: Alert, no acute distress Cardiovascular: No pedal edema Respiratory: No cyanosis, no use of accessory musculature GI: No organomegaly, abdomen is soft and non-tender Skin: No lesions in the area of chief complaint Neurologic: Sensation intact distally Psychiatric: Patient is competent for consent with normal  mood and affect Lymphatic: No axillary or cervical lymphadenopathy  MUSCULOSKELETAL: Left upper extremity is warm and well-perfused with no open wounds or lesions.  She is neurovascular intact.  Assessment: Left shoulder end-stage rotator cuff arthropathy  Plan: Plan to proceed today with reverse arthroplasty for advanced rotator cuff arthropathy.  Discussed the risk of bleeding, infection, damage to surrounding nerves and vessels, stiffness, dislocation, fracture, need for further surgery, and the risk of anesthesia.  She has provided informed consent.  Will plan for admission postoperatively and monitoring.    Nicholes Stairs, MD Cell [phone]    09/16/2022 7:20 AM

## 2022-09-16 NOTE — Anesthesia Postprocedure Evaluation (Signed)
Anesthesia Post Note  Patient: Evelyn Valdez  Procedure(s) Performed: REVERSE SHOULDER ARTHROPLASTY (Left: Shoulder)     Patient location during evaluation: PACU Anesthesia Type: Regional and General Level of consciousness: awake and alert Pain management: pain level controlled Vital Signs Assessment: post-procedure vital signs reviewed and stable Respiratory status: spontaneous breathing, nonlabored ventilation, respiratory function stable and patient connected to nasal cannula oxygen Cardiovascular status: blood pressure returned to baseline and stable Postop Assessment: no apparent nausea or vomiting Anesthetic complications: no   No notable events documented.  Last Vitals:  Vitals:   09/16/22 1200 09/16/22 1230  BP: (!) 146/87 (!) 154/82  Pulse: (!) 105 (!) 105  Resp: 15 15  Temp:    SpO2: 96% 96%    Last Pain:  Vitals:   09/16/22 1030  TempSrc:   PainSc: [place]

## 2022-09-16 NOTE — Op Note (Signed)
09/16/2022  8:55 AM  PATIENT:  Evelyn Valdez    PRE-OPERATIVE DIAGNOSIS:  Left shoulder osteoarthritis  POST-OPERATIVE DIAGNOSIS:  Same  PROCEDURE:  Left REVERSE SHOULDER ARTHROPLASTY  SURGEON:  Nicholes Stairs, MD  ASSISTANT: Jonelle Sidle, PA-C  Assistant attestation:  PA McClung present for the entire procedure.  ANESTHESIA:   General  ESTIMATED BLOOD LOSS: 100 cc  PREOPERATIVE INDICATIONS:  Evelyn Valdez is a  82 y.o. female with a diagnosis of Left shoulder osteoarthritis who failed conservative measures and elected for surgical management.    The risks benefits and alternatives were discussed with the patient preoperatively including but not limited to the risks of infection, bleeding, nerve injury, cardiopulmonary complications, the need for revision surgery, dislocation, brachial plexus palsy, incomplete relief of pain, among others, and the patient was willing to proceed.  OPERATIVE IMPLANTS:   Biomet comprehensive reverse system with a size 11 stem, a size +6 medialized humeral tray with a standard polyethylene liner. On the glenoid side a small augment oriented at the 12 o'clock position with a 25 mm central screw and 4 peripheral locking screws with a 36 mm +3 lateralized glenosphere.   OPERATIVE FINDINGS:  End-stage osteoarthritis with full-thickness cartilage loss of the humeral side as well as inferior osteophyte.  Superior rotator cuff tearing that was complete of the supraspinatus but intact infraspinatus.  Subscapularis was very thin and diminutive.  There was no significant retroversion but there was some high riding humeral head with superior inclination of the glenoid.  OPERATIVE PROCEDURE: The patient was brought to the operating room and placed in the supine position. General anesthesia was administered. IV antibiotics were given. A Foley was not placed. Time out was performed. The upper extremity was prepped and draped in usual sterile  fashion. The patient was in a beachchair position. Deltopectoral approach was carried out. The biceps was tenotomized. The subscapularis was released off of the bone.   I then performed circumferential releases of the humerus, and then dislocated the head, and then reamed with the reamer to the above named size.  I then applied the jig, and cut the humeral head in 30 of retroversion, and then turned my attention to the glenoid.  Deep retractors were placed, and I resected the labrum, and then placed a guidepin into the center position on the glenoid, with slight inferior inclination. I then reamed over the guidepin, and this created a small metaphyseal cancellus blush inferiorly, removing just the cartilage to the subchondral bone superiorly.  We then elected to use the small augment oriented in the superior position.  This allowed [place] to ream the minimal amount of glenoid to allow for a nice central compression screw through the baseplate.  The base plate was selected and impacted place, and then I secured it centrally with a nonlocking screw, and I had excellent purchase both inferiorly and superiorly. I placed a short locking screws on anterior and posterior aspects.  I then turned my attention to the glenosphere, and impacted this into place, placing slight inferior offset (set on A).   The glenoid sphere was completely seated, and had engagement of the [facility] taper. I then turned my attention back to the humerus.  I sequentially broached, and then trialed, and was found to restore soft tissue tension, and it had 2 finger tightness. Therefore the above named components were selected. The shoulder felt stable throughout functional motion.   I then impacted the real prosthesis into place, as well as the real  humeral tray, and reduced the shoulder. The shoulder had excellent motion, and was stable, and I irrigated the wounds copiously.   Subscapularis was not repaired.  I then irrigated the  shoulder copiously once more, then placed 1 g of vancomycin powder, repaired the deltopectoral interval with #2 FiberWire followed by subcutaneous Vicryl, then monocryl for the skin,  with Steri-Strips and sterile gauze for the skin. The patient was awakened and returned back in stable and satisfactory condition. There no complications and they tolerated the procedure well.  All counts were correct x2.  Disposition:  Evelyn Valdez will be admitted postoperatively for occupational therapy as well as pain control.  She will be able to use the left upper extremity immediately for activities of daily living.  Otherwise we will maintain sling while sleeping.

## 2022-09-16 NOTE — Anesthesia Procedure Notes (Signed)
Procedure Name: Intubation Date/Time: 09/16/2022 7:35 AM  Performed by: Jenne Campus, CRNAPre-anesthesia Checklist: Patient identified, Emergency Drugs available, Suction available and Patient being monitored Patient Re-evaluated:Patient Re-evaluated prior to induction Oxygen Delivery Method: Circle system utilized Preoxygenation: Pre-oxygenation with 100% oxygen Induction Type: IV induction Ventilation: Mask ventilation without difficulty Laryngoscope Size: Miller and 2 Grade View: Grade I Tube size: 7.0 mm Number of attempts: 1 Airway Equipment and Method: Stylet Placement Confirmation: ETT inserted through vocal cords under direct vision, positive ETCO2, CO2 detector and breath sounds checked- equal and bilateral Secured at: 22 cm Tube secured with: Tape

## 2022-09-16 NOTE — Care Management Obs Status (Signed)
[facility] NOTIFICATION   Patient Details  Name: JANIAH DEVINNEY MRN: [id number] Date of Birth: 07/03/1941   Medicare Observation Status Notification Given:  Yes    Sherie Don, LCSW 09/16/2022, 4:03 PM

## 2022-09-16 NOTE — Discharge Instructions (Signed)
Orthopedic surgery discharge instructions:  -Maintain postoperative bandage until follow-up appointment.  This is waterproof, and you may begin showering on postoperative day #3.  Do not submerge underwater.  Maintain that bandage until your follow-up appointment in 2 weeks.  -No lifting over 2 pounds with operateive arm.  You may use the arm immediately for activities of daily living such as bathing, washing your face and brushing your teeth, eating, and getting dressed.  Otherwise maintain your sling when you are out of the house and sleeping.  -Apply ice liberally to the shoulder throughout the day.  For mild to moderate pain use Tylenol and Advil as needed around-the-clock.  For breakthrough pain use oxycodone as necessary.  -You will return to see Dr. Ranita Stjulien in the office in 2 weeks for routine postoperative check with x-rays.  

## 2022-09-17 ENCOUNTER — Encounter (HOSPITAL_COMMUNITY): Payer: Self-pay | Admitting: Orthopedic Surgery

## 2022-09-17 DIAGNOSIS — M19012 Primary osteoarthritis, left shoulder: Secondary | ICD-10-CM | POA: Diagnosis not present

## 2022-09-17 LAB — BASIC METABOLIC PANEL
Anion gap: 9 (ref 5–15)
BUN: 15 mg/dL (ref 8–23)
CO2: 25 mmol/L (ref 22–32)
Calcium: 8.7 mg/dL — ABNORMAL LOW (ref 8.9–10.3)
Chloride: 106 mmol/L (ref 98–111)
Creatinine, Ser: 0.7 mg/dL (ref 0.44–1.00)
GFR, Estimated: >60 mL/min (ref >60)
Glucose, Bld: 164 mg/dL — ABNORMAL HIGH (ref 70–99)
Potassium: 4 mmol/L (ref 3.5–5.1)
Sodium: 140 mmol/L (ref 135–145)

## 2022-09-17 LAB — HEMOGLOBIN AND HEMATOCRIT, BLOOD
HCT: 37.7 % (ref 36.0–46.0)
Hemoglobin: 11.6 g/dL — ABNORMAL LOW (ref 12.0–15.0)

## 2022-09-17 MED ORDER — ASPIRIN 81 MG PO TBEC: 81.0000 mg | DELAYED_RELEASE_TABLET | Freq: Every day | ORAL | 0 refills | Status: AC

## 2022-09-17 MED ORDER — HYDROMORPHONE HCL 2 MG PO TABS: 2.0000 mg | ORAL_TABLET | Freq: Four times a day (QID) | ORAL | 0 refills | Status: AC | PRN

## 2022-09-17 MED ORDER — MODAFINIL 200 MG PO TABS
100.0000 mg | ORAL_TABLET | Freq: Every day | ORAL | Status: DC
  Administered 2022-09-17: 100 mg via ORAL
  Filled 2022-09-17: qty 1

## 2022-09-17 MED ORDER — ONDANSETRON HCL 4 MG PO TABS: 4.0000 mg | ORAL_TABLET | Freq: Three times a day (TID) | ORAL | 1 refills | Status: AC | PRN

## 2022-09-17 NOTE — Discharge Summary (Signed)
Patient ID: Evelyn Valdez MRN: [id number] DOB/AGE: 80/04/1941 80 y.o.  Admit date: 09/16/2022 Discharge date: 09/17/2022  Primary Diagnosis: Left shoulder osteoarthritis Admission Diagnoses: Status post left reverse total shoulder arthroplasty Past Medical History:  Diagnosis Date   Anemia    Anxiety    Asthma    "well controlled"   Depression    Diverticulosis    Dizziness    DVT (deep venous thrombosis) (HCC)    right lower leg after knee surgery   Dyspnea    Fibromyalgia    High cholesterol    History of anemia    History of bronchitis    History of kidney stones    History of pneumonia    Hypertension    Insomnia    Localized osteoarthritis of right shoulder    Obesity    Osteoarthritis    Osteopenia    Peripheral neuropathy    takes Topamax   Pneumonia    hx of several times per patient    PONV (postoperative nausea and vomiting)    nothing recent    Pre-diabetes    Vitamin D deficiency    Wears glasses    Discharge Diagnoses:   Principal Problem:   S/P reverse total shoulder arthroplasty, left  Estimated body mass index is 41.57 kg/m as calculated from the following:   Height as of this encounter: '5\' 1"'$  (1.549 m).   Weight as of this encounter: 99.8 kg.  Procedure:  Procedure(s) (LRB): REVERSE SHOULDER ARTHROPLASTY (Left)   Consults: None  HPI: Evelyn Valdez is a 82 year old female who was evaluated in our clinic for left shoulder pain and treated conservatively.  She had failed conservative management was indicated for a left reverse total shoulder arthroplasty.  She presented to the hospital on 09/16/2022 underwent left reverse total shoulder arthroplasty and was admitted for postoperative monitoring and OT.  Laboratory Data: Admission on 09/16/2022  Component Date Value Ref Range Status   Glucose-Capillary 09/16/2022 112 (H)  70 - 99 mg/dL Final   Glucose reference range applies only to samples taken after fasting for at least 8  hours.   Comment 1 09/16/2022 Notify RN   Final   Comment 2 09/16/2022 Document in Chart   Final   Creatinine, Ser 09/16/2022 0.73  0.44 - 1.00 mg/dL Final   GFR, Estimated 09/16/2022 >60  >60 mL/min Final   Comment: (NOTE) Calculated using the CKD-EPI Creatinine Equation (2021) Performed at [facility], [place] [address] [place], [place] [address]    Glucose-Capillary 09/16/2022 118 (H)  70 - 99 mg/dL Final   Glucose reference range applies only to samples taken after fasting for at least 8 hours.   WBC 09/16/2022 10.5  4.0 - 10.5 K/uL Final   RBC 09/16/2022 4.69  3.87 - 5.11 MIL/uL Final   Hemoglobin 09/16/2022 13.4  12.0 - 15.0 g/dL Final   HCT 09/16/2022 44.1  36.0 - 46.0 % Final   MCV 09/16/2022 94.0  80.0 - 100.0 fL Final   MCH 09/16/2022 28.6  26.0 - 34.0 pg Final   MCHC 09/16/2022 30.4  30.0 - 36.0 g/dL Final   RDW 09/16/2022 15.9 (H)  11.5 - 15.5 % Final   Platelets 09/16/2022 214  150 - 400 K/uL Final   nRBC 09/16/2022 0.0  0.0 - 0.2 % Final   Performed at [facility], [place] [address], [place], [place] [address]   Hemoglobin 09/17/2022 11.6 (L)  12.0 - 15.0 g/dL Final  HCT 09/17/2022 37.7  36.0 - 46.0 % Final   Performed at [place] [address], [place], [place] [address]   Sodium 09/17/2022 140  135 - 145 mmol/L Final   Potassium 09/17/2022 4.0  3.5 - 5.1 mmol/L Final   Chloride 09/17/2022 106  98 - 111 mmol/L Final   CO2 09/17/2022 25  22 - 32 mmol/L Final   Glucose, Bld 09/17/2022 164 (H)  70 - 99 mg/dL Final   Glucose reference range applies only to samples taken after fasting for at least 8 hours.   BUN 09/17/2022 15  8 - 23 mg/dL Final   Creatinine, Ser 09/17/2022 0.70  0.44 - 1.00 mg/dL Final   Calcium 09/17/2022 8.7 (L)  8.9 - 10.3 mg/dL Final   GFR, Estimated 09/17/2022 >60  >60 mL/min Final   Comment: (NOTE) Calculated using the CKD-EPI Creatinine Equation (2021)    Anion gap 09/17/2022 9  5 -  15 Final   Performed at [facility], [place] [address], [place], [place] [address]  Hospital Outpatient Visit on 09/05/2022  Component Date Value Ref Range Status   MRSA, PCR 09/05/2022 NEGATIVE  NEGATIVE Final   Staphylococcus aureus 09/05/2022 NEGATIVE  NEGATIVE Final   Comment: (NOTE) The Xpert SA Assay (FDA approved for NASAL specimens in patients 52 years of age and older), is one component of a comprehensive surveillance program. It is not intended to diagnose infection nor to guide or monitor treatment. Performed at [facility], [place] [address], [place], [place] [address]    Hgb A1c MFr Bld 09/05/2022 6.2 (H)  4.8 - 5.6 % Final   Comment: (NOTE)         Prediabetes: 5.7 - 6.4         Diabetes: >6.4         Glycemic control for adults with diabetes: <7.0    Mean Plasma Glucose 09/05/2022 131  mg/dL Final   Comment: (NOTE) Performed At: [facility] [place], [place] [id number] Rush Farmer MD FU:[id number]    Sodium 09/05/2022 140  135 - 145 mmol/L Final   Potassium 09/05/2022 4.5  3.5 - 5.1 mmol/L Final   Chloride 09/05/2022 103  98 - 111 mmol/L Final   CO2 09/05/2022 27  22 - 32 mmol/L Final   Glucose, Bld 09/05/2022 120 (H)  70 - 99 mg/dL Final   Glucose reference range applies only to samples taken after fasting for at least 8 hours.   BUN 09/05/2022 15  8 - 23 mg/dL Final   Creatinine, Ser 09/05/2022 0.80  0.44 - 1.00 mg/dL Final   Calcium 09/05/2022 9.6  8.9 - 10.3 mg/dL Final   GFR, Estimated 09/05/2022 >60  >60 mL/min Final   Comment: (NOTE) Calculated using the CKD-EPI Creatinine Equation (2021)    Anion gap 09/05/2022 10  5 - 15 Final   Performed at [facility], [place] [address], [place], [place] [address]   WBC 09/05/2022 9.3  4.0 - 10.5 K/uL Final   RBC 09/05/2022 4.82  3.87 - 5.11 MIL/uL Final   Hemoglobin 09/05/2022 13.9  12.0 - 15.0 g/dL Final   HCT 09/05/2022 44.6  36.0 -  46.0 % Final   MCV 09/05/2022 92.5  80.0 - 100.0 fL Final   MCH 09/05/2022 28.8  26.0 - 34.0 pg Final   MCHC 09/05/2022 31.2  30.0 - 36.0 g/dL Final   RDW 09/05/2022 15.9 (H)  11.5 - 15.5 %  Final   Platelets 09/05/2022 227  150 - 400 K/uL Final   nRBC 09/05/2022 0.0  0.0 - 0.2 % Final   Performed at [facility], [place] [address] [place], [place] [address]   Glucose-Capillary 09/05/2022 137 (H)  70 - 99 mg/dL Final   Glucose reference range applies only to samples taken after fasting for at least 8 hours.     X-Rays:DG Shoulder Left Port  Result Date: 09/16/2022 CLINICAL DATA:  Reverse shoulder replacement. EXAM: LEFT SHOULDER COMPARISON:  None Available. FINDINGS: Gas in the soft tissues is consistent with recent surgery. The patient is status post reverse shoulder replacement. Hardware is in good position. No bony or soft tissue abnormality is otherwise identified IMPRESSION: Left shoulder replacement as above. Electronically Signed   By: Dorise Bullion III M.D.   On: 09/16/2022 09:49   CT SHOULDER LEFT WO CONTRAST  Result Date: 09/05/2022 CLINICAL DATA:  Preop left shoulder surgery. EXAM: CT OF THE UPPER LEFT EXTREMITY WITHOUT CONTRAST TECHNIQUE: Multidetector CT imaging of the upper left extremity was performed according to the standard protocol. RADIATION DOSE REDUCTION: This exam was performed according to the departmental dose-optimization program which includes automated exposure control, adjustment of the mA and/or kV according to patient size and/or use of iterative reconstruction technique. COMPARISON:  None Available. FINDINGS: Bones/Joint/Cartilage No fracture or dislocation. Normal alignment. No joint effusion. Severe osteoarthritis of the glenohumeral joint. Mild arthropathy of the acromioclavicular joint. Ligaments Ligaments are suboptimally evaluated by CT. Muscles and Tendons Muscles are normal. No muscle atrophy. Probable mild tendinosis of the supraspinatus  tendon and partial thickness articular surface tear. Soft tissue No fluid collection or hematoma. No soft tissue mass. Visualized portions of the lung are clear. Thoracic aortic atherosclerosis. Coronary artery atherosclerosis. IMPRESSION: 1. Severe osteoarthritis of the glenohumeral joint. 2.  Aortic Atherosclerosis (ICD10-I70.0). Electronically Signed   By: Kathreen Devoid M.D.   On: 09/05/2022 08:59    EKG: Orders placed or performed during the hospital encounter of 09/05/22   EKG 12 lead per protocol   EKG 12 lead per protocol       Physical exam:  General: Sitting in hospital chair with sling donned, no acute distress  Left upper extremity: Sling present, neurovascular intact distally, able to move elbow, wrist, digits.  Sensation intact to the deltoid.  Aquacel dressing present, clean, dry, intact.    Bilateral calves soft and supple.  Nontender        Hospital Course: TAEJA DEBELLIS is a 82 y.o. who was admitted to Hospital. They were brought to the operating room on 09/16/2022 and underwent Procedure(s): [place].  Patient tolerated the procedure well and was later transferred to the recovery room and then to the orthopaedic floor for postoperative care.  They were given PO and IV analgesics for pain control following their surgery.  They were given 24 hours of postoperative antibiotics of  Anti-infectives (From admission, onward)    Start     Dose/Rate Route Frequency Ordered Stop   09/16/22 1430  clindamycin (CLEOCIN) IVPB 600 mg        600 mg 100 mL/hr over 30 Minutes Intravenous Every 6 hours 09/16/22 1427 09/17/22 0216   09/16/22 0800  vancomycin (VANCOCIN) powder  Status:  Discontinued          As needed 09/16/22 0801 09/16/22 1427   09/16/22 0600  clindamycin (CLEOCIN) IVPB 900 mg        900 mg 100 mL/hr over 30  Minutes Intravenous On call to O.R. 09/16/22 0535 09/16/22 0740   09/16/22 0540  ceFAZolin (ANCEF) 2-4 GM/100ML-% IVPB  Status:   Discontinued       Note to Pharmacy: Jasmine Pang K: cabinet override      09/16/22 0540 09/16/22 0601      and started on DVT prophylaxis in the form of Lovenox.    OT consult was ordered for total joint protocol.  Discharge planning consulted to help with postop disposition and equipment needs.  Patient had an uneventful night on the evening of surgery.  They started to get up OOB with therapy on day one. The patient had progressed with therapy and meeting their goals.  Bandage was clean and dry.  Patient was seen in rounds and was ready to go home.   Diet: Regular diet Activity:NWB left upper extremity Follow-up:in 2 weeks Disposition - Home Discharged Condition: good   Discharge Instructions     Call MD / Call 911   Complete by: As directed    If you experience chest pain or shortness of breath, CALL 911 and be transported to the hospital emergency room.  If you develope a fever above 101 F, pus (white drainage) or increased drainage or redness at the wound, or calf pain, call your surgeon's office.   Constipation Prevention   Complete by: As directed    Drink plenty of fluids.  Prune juice may be helpful.  You may use a stool softener, such as Colace (over the counter) 100 mg twice a day.  Use MiraLax (over the counter) for constipation as needed.   Diet - low sodium heart healthy   Complete by: As directed    Increase activity slowly as tolerated   Complete by: As directed    Post-operative opioid taper instructions:   Complete by: As directed    POST-OPERATIVE OPIOID TAPER INSTRUCTIONS: It is important to wean off of your opioid medication as soon as possible. If you do not need pain medication after your surgery it is ok to stop day one. Opioids include: Codeine, Hydrocodone(Norco, Vicodin), Oxycodone(Percocet, oxycontin) and hydromorphone amongst others.  Long term and even short term use of opiods can cause: Increased pain  response Dependence Constipation Depression Respiratory depression And more.  Withdrawal symptoms can include Flu like symptoms Nausea, vomiting And more Techniques to manage these symptoms Hydrate well Eat regular healthy meals Stay active Use relaxation techniques(deep breathing, meditating, yoga) Do Not substitute Alcohol to help with tapering If you have been on opioids for less than two weeks and do not have pain than it is ok to stop all together.  Plan to wean off of opioids This plan should start within one week post op of your joint replacement. Maintain the same interval or time between taking each dose and first decrease the dose.  Cut the total daily intake of opioids by one tablet each day Next start to increase the time between doses. The last dose that should be eliminated is the evening dose.         Allergies as of 09/17/2022       Reactions   Penicillins Swelling, Rash, Other (See Comments), Hives   PATIENT HAS HAD A PCN REACTION WITH IMMEDIATE RASH, FACIAL/TONGUE/THROAT SWELLING, SOB, OR LIGHTHEADEDNESS WITH HYPOTENSION:  #  #  #  YES  #  #  #   Has patient had a PCN reaction causing severe rash involving mucus membranes or skin necrosis: No PATIENT HAS HAD A PCN  REACTION THAT REQUIRED HOSPITALIZATION:  #  #  #  YES  #  #  #  Has patient had a PCN reaction occurring within the last 10 years: No   Adhesive [tape] Other (See Comments)   Pulled skin off   Advair Diskus [fluticasone-salmeterol] Other (See Comments)   headaches   Lyrica [pregabalin] Other (See Comments)   Weight gain   Nsaids    UNSPECIFIED REACTION - was told not to take by Dr    Vancomycin Itching   Receiving vancomycin infusion, about 45 minutes into infusion pt c/o itching, face and head (scalp) is bright red.     Baclofen Other (See Comments)   Patient doesn't feel herself on this medication    Erythromycin Nausea And Vomiting   Hydrocodone Nausea And Vomiting   Morphine And Related  Nausea And Vomiting   Nickel Rash   Percocet [oxycodone-acetaminophen] Nausea And Vomiting   Sulfa Antibiotics Nausea And Vomiting, Rash   Sulfasalazine Rash        Medication List     TAKE these medications    acetaminophen 500 MG tablet Commonly known as: TYLENOL Take 1,000 mg by mouth every 6 (six) hours as needed for moderate pain or headache.   albuterol 108 (90 Base) MCG/ACT inhaler Commonly known as: VENTOLIN HFA Inhale 1-2 puffs into the lungs every 6 (six) hours as needed for wheezing or shortness of breath.   amLODipine 5 MG tablet Commonly known as: NORVASC Take 5 mg by mouth every evening.   aspirin EC 81 MG tablet Take 1 tablet (81 mg total) by mouth daily. Swallow whole.   atorvastatin 20 MG tablet Commonly known as: LIPITOR Take 20 mg by mouth every evening.   Biofreeze 4 % Gel Generic drug: Menthol (Topical Analgesic) Apply 1 Application topically daily as needed (pain).   Biotin 5 MG Tabs Take 5 mg by mouth daily.   DULoxetine 60 MG capsule Commonly known as: CYMBALTA Take 60 mg by mouth 2 (two) times daily.   HYDROmorphone 2 MG tablet Commonly known as: DILAUDID Take 2 mg by mouth every 4 (four) hours as needed for severe pain. What changed: Another medication with the same name was added. Make sure you understand how and when to take each.   HYDROmorphone 2 MG tablet Commonly known as: Dilaudid Take 1 tablet (2 mg total) by mouth every 6 (six) hours as needed for severe pain. What changed: You were already taking a medication with the same name, and this prescription was added. Make sure you understand how and when to take each.   hydrOXYzine 25 MG tablet Commonly known as: ATARAX Take 25 mg by mouth 3 (three) times daily as needed for anxiety or itching.   loperamide 2 MG tablet Commonly known as: IMODIUM A-D Take 2 mg by mouth 4 (four) times daily as needed for diarrhea or loose stools.   melatonin 5 MG Tabs Take 5 mg by mouth at  bedtime as needed (sleep).   meloxicam 15 MG tablet Commonly known as: MOBIC Take 15 mg by mouth daily.   mupirocin cream 2 % Commonly known as: BACTROBAN Apply 1 Application topically 2 (two) times daily as needed (wound care).   Nuvigil 150 MG tablet Generic drug: Armodafinil Take 150 mg by mouth daily.   nystatin cream Commonly known as: MYCOSTATIN Apply 1 Application topically daily as needed for dry skin.   olmesartan 40 MG tablet Commonly known as: BENICAR Take 40 mg by mouth daily.  omeprazole 20 MG capsule Commonly known as: PRILOSEC Take 20 mg by mouth 2 (two) times daily.   ondansetron 4 MG tablet Commonly known as: Zofran Take 1 tablet (4 mg total) by mouth every 8 (eight) hours as needed for nausea or vomiting.   Ozempic (0.25 or 0.5 MG/DOSE) 2 MG/1.5ML Sopn Generic drug: Semaglutide(0.25 or 0.'5MG'$ /DOS) Inject 0.25 mg into the skin every Sunday.   Vitamin D3 250 MCG (10000 UT) capsule Take 10,000 Units by mouth every other day.        Follow-up Information     Nicholes Stairs, MD Follow up in 2 week(s).   Specialty: Orthopedic Surgery Contact information: [address] [place] [address] [phone]                 Signed: Jonelle Sidle PA-C  Orthopaedic Surgery 09/17/2022, 12:37 PM

## 2022-09-17 NOTE — TOC CM/SW Note (Signed)
Transition of Care [facility]) Screening Note  Patient Details  Name: Evelyn Valdez Date of Birth: 1941/03/17  Transition of Care [facility]) CM/SW Contact:    Sherie Don, LCSW Phone Number: 09/17/2022, 10:03 AM  Transition of Care Department [facility]) has reviewed patient and no TOC needs have been identified at this time. We will continue to monitor patient advancement through interdisciplinary progression rounds. If new patient transition needs arise, please place a TOC consult.

## 2022-09-17 NOTE — Plan of Care (Signed)
  Problem: Education: Goal: Knowledge of the prescribed therapeutic regimen will improve Outcome: Progressing   Problem: Pain Management: Goal: Pain level will decrease with appropriate interventions Outcome: Progressing   Problem: Activity: Goal: Ability to tolerate increased activity will improve Outcome: Progressing

## 2022-09-17 NOTE — Evaluation (Signed)
Occupational Therapy Evaluation Patient Details Name: Evelyn Valdez MRN: [id number] DOB: 1941/06/17 Today's Date: 09/17/2022   History of Present Illness Patient is a 82 year old female who presented with left shoulder osteoarthritis. Patient underwent scheduled reverse shoulder replacement. PMH: anxiety, fibromyalgia, osteoarthritis, peripheral neuropathy, R rTSA 2019,   Clinical Impression   Patient is a 82 year old female who was admitted for above. Patient was supervision for toileting tasks with education on proper positioning of LUE. patient requested that rest of session be held until husband arrives to be a part of education. Patient's discharge plan remains appropriate at this time. OT will continue to follow acutely.        Recommendations for follow up therapy are one component of a multi-disciplinary discharge planning process, led by the attending physician.  Recommendations may be updated based on patient status, additional functional criteria and insurance authorization.   Follow Up Recommendations  Follow physician's recommendations for discharge plan and follow up therapies     Assistance Recommended at Discharge Intermittent Supervision/Assistance  Patient can return home with the following A little help with bathing/dressing/bathroom;Assistance with cooking/housework;Assist for transportation;Help with stairs or ramp for entrance    Functional Status Assessment  Patient has had a recent decline in their functional status and demonstrates the ability to make significant improvements in function in a reasonable and predictable amount of time.  Equipment Recommendations       Recommendations for Other Services       Precautions / Restrictions Precautions Precautions: Shoulder Type of Shoulder Precautions: shoulder ROM PROM/AROM FF-0-90, ER0-30, no Abduction, ok for AROM hand wrist and elbow. Shoulder Interventions: Shoulder sling/immobilizer;At all times;Off  for dressing/bathing/exercises Precaution Booklet Issued: Yes (comment) (handout) Restrictions Weight Bearing Restrictions: Yes LUE Weight Bearing: Non weight bearing      Mobility Bed Mobility Overal bed mobility: Modified Independent             General bed mobility comments: with good awareness of UE retritctions.        Balance Overall balance assessment: No apparent balance deficits (not formally assessed)       ADL either performed or assessed with clinical judgement   ADL Overall ADL's : Needs assistance/impaired       Toilet Transfer: Supervision/safety;Ambulation Toilet Transfer Details (indicate cue type and reason): to commode in room. Toileting- Clothing Manipulation and Hygiene: Modified independent;Sit to/from stand         General ADL Comments: patient wanted to get out of bed to chair for breakfast that arrived while discussion of recommendations was taking place. patient requested that further eduaction be stopped until husband was present in room. time frame was developed to further educaiton.     Vision Baseline Vision/History: 1 Wears glasses              Pertinent Vitals/Pain Pain Assessment Pain Assessment: No/denies pain (block still in place)        Extremity/Trunk Assessment Upper Extremity Assessment Upper Extremity Assessment: LUE deficits/detail LUE Deficits / Details: block still in place   Lower Extremity Assessment Lower Extremity Assessment: Overall WFL for tasks assessed   Cervical / Trunk Assessment Cervical / Trunk Assessment: Normal      Cognition Arousal/Alertness: Awake/alert Behavior During Therapy: WFL for tasks assessed/performed Overall Cognitive Status: Within Functional Limits for tasks assessed               Shoulder Instructions Shoulder Instructions Correct positioning of sling/immobilizer: Supervision/safety    Home Living Family/patient expects  to be discharged to:: Private  residence Living Arrangements: Spouse/significant other Available Help at Discharge: Family;Available 24 hours/day               OT Problem List: Decreased knowledge of precautions;Impaired UE functional use;Pain      OT Treatment/Interventions:      OT Goals(Current goals can be found in the care plan section) Acute Rehab OT Goals Patient Stated Goal: to go home today OT Goal Formulation: With patient Time For Goal Achievement: 10/01/22 Potential to Achieve Goals: Fair  OT Frequency: Min 1X/week       AM-PAC OT "6 Clicks" Daily Activity     Outcome Measure Help from another person eating meals?: A Little Help from another person taking care of personal grooming?: A Little Help from another person toileting, which includes using toliet, bedpan, or urinal?: A Little Help from another person bathing (including washing, rinsing, drying)?: A Little Help from another person to put on and taking off regular upper body clothing?: A Little Help from another person to put on and taking off regular lower body clothing?: A Little 6 Click Score: 18   End of Session Equipment Utilized During Treatment: Other (comment) (sling) Nurse Communication: Other (comment) (ok to participate in session)  Activity Tolerance: Patient tolerated treatment well Patient left: in chair;with call bell/phone within reach  OT Visit Diagnosis: Pain Pain - Right/Left: Left Pain - part of body: Shoulder                Time: 3500-9381 OT Time Calculation (min): 10 min Charges:  OT General Charges $OT Visit: 1 Visit OT Evaluation $OT Eval Low Complexity: 1 Low  Donika Butner OTR/L, MS Acute Rehabilitation Department Office# [phone]   Willa Rough 09/17/2022, 10:32 AM

## 2022-09-17 NOTE — Progress Notes (Signed)
Occupational Therapy Treatment Patient Details Name: Evelyn Valdez MRN: [id number] DOB: 1941-03-04 Today's Date: 09/17/2022   History of present illness Patient is a 82 year old female who presented with left shoulder osteoarthritis. Patient underwent scheduled reverse shoulder replacement. PMH: anxiety, fibromyalgia, osteoarthritis, peripheral neuropathy, R rTSA 2019,   OT comments  s/p shoulder replacement without functional use of left non dominant upper extremity secondary to effects of surgery and interscalene block and shoulder precautions. Therapist provided education and instruction to patient and spouse in regards to exercises, precautions, positioning, donning upper extremity clothing and bathing while maintaining shoulder precautions, ice and edema management and donning/doffing sling. Patient and spouse verbalized understanding and demonstrated as needed. Patient needed assistance to donn shirt, underwear, pants, socks and shoes and provided with instruction on compensatory strategies to perform ADLs. Patient to follow up with MD for further therapy needs.     Recommendations for follow up therapy are one component of a multi-disciplinary discharge planning process, led by the attending physician.  Recommendations may be updated based on patient status, additional functional criteria and insurance authorization.    Follow Up Recommendations  Follow physician's recommendations for discharge plan and follow up therapies     Assistance Recommended at Discharge Intermittent Supervision/Assistance  Patient can return home with the following  A little help with bathing/dressing/bathroom;Assistance with cooking/housework;Assist for transportation;Help with stairs or ramp for entrance   Equipment Recommendations    Toileting buddy   Recommendations for Other Services      Precautions / Restrictions Precautions Precautions: Shoulder Type of Shoulder Precautions: shoulder ROM  PROM/AROM FF-0-90, ER0-30, no Abduction, ok for AROM hand wrist and elbow. Shoulder Interventions: Shoulder sling/immobilizer;At all times;Off for dressing/bathing/exercises Precaution Booklet Issued: Yes (comment) Restrictions Weight Bearing Restrictions: Yes LUE Weight Bearing: Non weight bearing       Mobility Bed Mobility Overal bed mobility: Modified Independent             General bed mobility comments: with good awareness of UE retritctions.       Balance Overall balance assessment: No apparent balance deficits (not formally assessed)       ADL either performed or assessed with clinical judgement   ADL Overall ADL's : Needs assistance/impaired    Patient was able to don modified t shirt with snaps on sides and sleeves with min A for husband. Patient's husband was able to don sling with MI and increased time. Patient expressed concerns over hygiene. Husband reported he would assist. Patient and husband were educated on toileting buddy. Patient to look into toileting buddy. Patient was min A to don pants and pull up on L side with husband able to demonstrate ability to assist. Husband and patient are eager to transition home.       Extremity/Trunk Assessment Upper Extremity Assessment Upper Extremity Assessment: LUE deficits/detail LUE Deficits / Details: able to complete one rep of each exercise. unable to tolerate FF with increased pain with attempts.   Lower Extremity Assessment Lower Extremity Assessment: Overall WFL for tasks assessed   Cervical / Trunk Assessment Cervical / Trunk Assessment: Normal    Vision Baseline Vision/History: 1 Wears glasses     Perception     Praxis      Cognition Arousal/Alertness: Awake/alert Behavior During Therapy: WFL for tasks assessed/performed Overall Cognitive Status: Within Functional Limits for tasks assessed  General Comments: husband present for session         Exercises      Shoulder Instructions Shoulder Instructions Donning/doffing shirt without moving shoulder: Modified independent;Caregiver independent with task Method for sponge bathing under operated UE: Caregiver independent with task Donning/doffing sling/immobilizer: Caregiver independent with task Correct positioning of sling/immobilizer: Caregiver independent with task ROM for elbow, wrist and digits of operated UE: Modified independent Sling wearing schedule (on at all times/off for ADL's): Caregiver independent with task Proper positioning of operated UE when showering: Caregiver independent with task Positioning of UE while sleeping: Caregiver independent with task     General Comments      Pertinent Vitals/ Pain       Pain Assessment Pain Assessment: No/denies pain (block in place)  Home Living Family/patient expects to be discharged to:: Private residence Living Arrangements: Spouse/significant other Available Help at Discharge: Family;Available 24 hours/day                                    Prior Functioning/Environment              Frequency  Min 1X/week        Progress Toward Goals  OT Goals(current goals can now be found in the care plan section)  Progress towards OT goals: Progressing toward goals  Acute Rehab OT Goals Patient Stated Goal: to go home today OT Goal Formulation: With patient Time For Goal Achievement: 10/01/22 Potential to Achieve Goals: Fair  Plan      Co-evaluation                 AM-PAC OT "6 Clicks" Daily Activity     Outcome Measure   Help from another person eating meals?: A Little Help from another person taking care of personal grooming?: A Little Help from another person toileting, which includes using toliet, bedpan, or urinal?: A Little Help from another person bathing (including washing, rinsing, drying)?: A Little Help from another person to put on and taking off regular upper body  clothing?: A Little Help from another person to put on and taking off regular lower body clothing?: A Little 6 Click Score: 18    End of Session Equipment Utilized During Treatment: Other (comment) (sling)  OT Visit Diagnosis: Pain Pain - Right/Left: Left Pain - part of body: Shoulder   Activity Tolerance Patient tolerated treatment well   Patient Left in bed;with call bell/phone within reach;with family/visitor present   Nurse Communication Other (comment) (ok to participate)        Time: 5035-4656 OT Time Calculation (min): 21 min  Charges: OT General Charges $OT Visit: 1 Visit OT Evaluation $OT Eval Low Complexity: 1 Low OT Treatments $Self Care/Home Management : 8-22 mins  Rennie Plowman, MS Acute Rehabilitation Department Office# [phone]   Willa Rough 09/17/2022, 10:36 AM

## 2022-10-03 ENCOUNTER — Ambulatory Visit (INDEPENDENT_AMBULATORY_CARE_PROVIDER_SITE_OTHER): Payer: PPO | Admitting: Licensed Clinical Social Worker

## 2022-10-03 DIAGNOSIS — F331 Major depressive disorder, recurrent, moderate: Secondary | ICD-10-CM

## 2022-10-03 NOTE — Progress Notes (Addendum)
  THERAPIST PROGRESS NOTE  Session Time: [place] in office visit for patient and LCSW clinician  Participation Level: Active  Behavioral Response: Neat and Well GroomedAlertAnxious and Depressed  Type of Therapy: Individual Therapy  Treatment Goals addressed: Decrease depressive symptoms and improve levels of effective functioning-pt reports a decrease in overall depression symptoms 3 out of 5 sessions documented   Develop healthy thinking patterns and beliefs about self, others, and the world that lead to the alleviation and help prevent the relapse of depression per self report 3 out of 5 sessions documented   ProgressTowards Goals: Progressing  Interventions: Solution Focused, Supportive, and Other: Trauma focused CBT  Summary: Evelyn Valdez is a 82 y.o. female who presents with improving symptoms related to depression diagnosis. Pt reports that mood has been stable since last session.   Allowed pt to explore and express thoughts and feelings associated with recent life situations and external stressors.  Patient reports that she is experiencing ongoing stress associated with pain related to recent shoulder surgery.  Patient reports that her husband has done a great job of helping her recover post operatively.  Patient states she is continuing to have stress associated with her marriage--patient told her husband recently that she wanted a divorce.  Allowed patient safe space to explore her thoughts and feelings about her marital relationship.  Patient reports that she did discuss her concerns with her pastor, which upset her husband and discouraged him from attending church again.  Allowed patient to explore recent family conflicts with her son and her daughter.  Allow patient to explore relationship with both her son and her daughter, and identify anxiety triggers, and frustrations that have been triggered by them, and why.  Patient reports  that she has been setting boundaries and setting limits with both of them, which make her feel like she is protecting her space.  Encouraged patient to continue identifying limits and boundaries that she can set with all family members.  Reviewed importance of self-care, recovery, and reaching out to others for help as she is continuing to recover from the surgery.  Patient reflects understanding and is willing to cooperate.  Continued recommendations are as follows: self care behaviors, positive social engagements, focusing on overall work/home/life balance, and focusing on positive physical and emotional wellness.    Suicidal/Homicidal: No  Therapist Response: Pt is continuing to apply interventions learned in session into daily life situations. Pt is currently on track to meet goals utilizing interventions mentioned above. Personal growth and progress noted. Treatment to continue as indicated.   Plan: Return again in 4 weeks.  Diagnosis:  Encounter Diagnosis  Name Primary?   MDD (major depressive disorder), recurrent episode, moderate ([place]) Yes    Collaboration of Care: Other n/a at time of session.  Patient/Guardian was advised Release of Information must be obtained prior to any record release in order to collaborate their care with an outside provider. Patient/Guardian was advised if they have not already done so to contact the registration department to sign all necessary forms in order for [place] to release information regarding their care.   Consent: Patient/Guardian gives verbal consent for treatment and assignment of benefits for services provided during this visit. Patient/Guardian expressed understanding and agreed to proceed.   [place], LCSW 10/03/2022

## 2022-11-13 ENCOUNTER — Ambulatory Visit (HOSPITAL_COMMUNITY): Payer: PPO | Admitting: Licensed Clinical Social Worker

## 2022-11-18 ENCOUNTER — Ambulatory Visit (INDEPENDENT_AMBULATORY_CARE_PROVIDER_SITE_OTHER): Payer: PPO | Admitting: Licensed Clinical Social Worker

## 2022-11-18 DIAGNOSIS — T148XXA Other injury of unspecified body region, initial encounter: Secondary | ICD-10-CM

## 2022-11-18 DIAGNOSIS — F331 Major depressive disorder, recurrent, moderate: Secondary | ICD-10-CM
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
## 2022-12-12 ENCOUNTER — Ambulatory Visit (HOSPITAL_COMMUNITY): Payer: PPO | Admitting: Licensed Clinical Social Worker

## 2022-12-13 ENCOUNTER — Ambulatory Visit (INDEPENDENT_AMBULATORY_CARE_PROVIDER_SITE_OTHER): Payer: PPO | Admitting: Licensed Clinical Social Worker

## 2022-12-13 DIAGNOSIS — F331 Major depressive disorder, recurrent, moderate: Secondary | ICD-10-CM | POA: Diagnosis not present

## 2022-12-13 NOTE — Progress Notes (Signed)
Virtual Visit via Video Note  I connected with JUNELL CULLIFER on 12/13/2022 at 10:00 AM EDT by a video enabled telemedicine application and verified that I am speaking with the correct person using two identifiers.  Location: Patient: home Provider: remote office [place], [place])   I discussed the limitations of evaluation and management by telemedicine and the availability of in person appointments. The patient expressed understanding and agreed to proceed.   I discussed the assessment and treatment plan with the patient. The patient was provided an opportunity to ask questions and all were answered. The patient agreed with the plan and demonstrated an understanding of the instructions.   The patient was advised to call back or seek an in-person evaluation if the symptoms worsen or if the condition fails to improve as anticipated.  I provided 60 minutes of non-face-to-face time during this encounter.   Kryssa Risenhoover R Shimon [place], LCSW  THERAPIST PROGRESS NOTE  Session Time: 10-11a  Participation Level: Active  Behavioral Response: Neat and Well GroomedAlertAnxious and Depressed  Type of Therapy: Individual Therapy  Treatment Goals addressed: Decrease depressive symptoms and improve levels of effective functioning-pt reports a decrease in overall depression symptoms 3 out of 5 sessions documented   Develop healthy thinking patterns and beliefs about self, others, and the world that lead to the alleviation and help prevent the relapse of depression per self report 3 out of 5 sessions documented   ProgressTowards Goals: Progressing  Interventions: Solution Focused, Supportive, and Other: Trauma focused CBT  Summary: TAHISHA HAKIM is a 82 y.o. female who presents with improving symptoms related to depression diagnosis. Pt reports that mood has been stable since last session and patient feels that she is managing situational stressors better.  Clinician assisted pt with  identifying situations/scenarios/schemas triggering anxiety and/or depression symptoms. Allowed pt to explore and express thoughts and feelings and discussed current coping mechanisms. Reviewed changes/recommendations.  Patient states that she is continuing to identify things that are stressing her in the environment, but a lot of them are things that have been going on for years that she does not feel are going to change.  Discussed the concept of radical acceptance to patient, and she is aware of this and is recognizing the importance of accepting things the way they are and focusing on her own expectations of self and others.  Allow patient safe space to explore the importance of her faith in her own behaviors and actions.  Patient states that she has mixed feelings about leaving her church-patient states that she enjoyed going, and enjoyed listening to the sermons, but she just did not like the people and did not like the internal cliques that were happening.  Patient states that currently her primary support system are her sisters, and that she truly loves being around her sisters.  Patient states they live in [place], and patient is making a trip to visit them in 1 week.  Patient states she is looking forward to this.   Patient recognizes that she has a fear of hurt and fear of disappointment.  Reviewed coping skills for managing symptoms of depression and symptoms of anxiety.  Continued recommendations are as follows: self care behaviors, positive social engagements, focusing on overall work/home/life balance, and focusing on positive physical and emotional wellness.   Suicidal/Homicidal: No  Therapist Response: Pt is continuing to apply interventions learned in session into daily life situations. Pt is currently on track to meet goals utilizing interventions mentioned above. Personal growth and progress  noted. Treatment to continue as indicated.   Plan: Return again in 4 weeks.  Diagnosis:   Encounter Diagnosis  Name Primary?   MDD (major depressive disorder), recurrent episode, moderate Yes    Collaboration of Care: Other n/a at time of session.  Patient/Guardian was advised Release of Information must be obtained prior to any record release in order to collaborate their care with an outside provider. Patient/Guardian was advised if they have not already done so to contact the registration department to sign all necessary forms in order for [place] to release information regarding their care.   Consent: Patient/Guardian gives verbal consent for treatment and assignment of benefits for services provided during this visit. Patient/Guardian expressed understanding and agreed to proceed.   Ernest Haber Aldahir Litaker, LCSW 12/13/2022

## 2023-01-29 ENCOUNTER — Ambulatory Visit (HOSPITAL_COMMUNITY): Payer: PPO | Admitting: Licensed Clinical Social Worker

## 2023-01-29 DIAGNOSIS — F331 Major depressive disorder, recurrent, moderate: Secondary | ICD-10-CM | POA: Diagnosis not present

## 2023-01-29 NOTE — Progress Notes (Signed)
Virtual Visit via Video Note  I connected with Evelyn ZOPFI on 01/29/23 at 11:00 AM EDT by a video enabled telemedicine application and verified that I am speaking with the correct person using two identifiers.  Location: Patient: home Provider: remote office [place], [place])   I discussed the limitations of evaluation and management by telemedicine and the availability of in person appointments. The patient expressed understanding and agreed to proceed.   I discussed the assessment and treatment plan with the patient. The patient was provided an opportunity to ask questions and all were answered. The patient agreed with the plan and demonstrated an understanding of the instructions.   The patient was advised to call back or seek an in-person evaluation if the symptoms worsen or if the condition fails to improve as anticipated.  I provided 45 minutes of non-face-to-face time during this encounter.   Lakara Weiland R Maurina Fawaz, LCSW  THERAPIST PROGRESS NOTE  Session Time: 11-1145a  Participation Level: Active  Behavioral Response: Neat and Well GroomedAlertAnxious and Depressed  Type of Therapy: Individual Therapy  Treatment Goals addressed: Decrease depressive symptoms and improve levels of effective functioning-pt reports a decrease in overall depression symptoms 3 out of 5 sessions documented   Develop healthy thinking patterns and beliefs about self, others, and the world that lead to the alleviation and help prevent the relapse of depression per self report 3 out of 5 sessions documented   ProgressTowards Goals: Progressing  Interventions: Solution Focused, Supportive, and Other: Trauma focused CBT  Summary: KHALIAH BOEHLKE is a 82 y.o. female who presents with improving symptoms related to depression diagnosis. Pt reports that mood has been stable since last session and patient feels that she is managing situational stressors better.  Clinician assisted pt with  identifying current levels of chronic pain (shoulder, back,  and knee), and explored medical interventions for treating chronic pain (pain management doctor) Discussed overall impact of pain on daily activities, relationships, and ability/inability to engage in self care or recreational activities. Reviewed keeping balance between rest and activity, and to continue communicating with medical providers for pain management. Reviewed mindfulness and meditation as relaxation interventions. Pt excited about upcoming trip to [place].   Allowed pt to explore thoughts and feelings associated with loss--several friends in the last couple of years. . Discussed pts awareness of how loss has impacted overall life. Clinician provided empathy and support while giving patient safe space to explore grief.   Continued recommendations are as follows: self care behaviors, positive social engagements, focusing on overall work/home/life balance, and focusing on positive physical and emotional wellness.   Suicidal/Homicidal: No  Therapist Response: Pt is continuing to apply interventions learned in session into daily life situations. Pt is currently on track to meet goals utilizing interventions mentioned above. Personal growth and progress noted. Treatment to continue as indicated.   Plan: Return again in 4 weeks.  Diagnosis:  Encounter Diagnosis  Name Primary?   MDD (major depressive disorder), recurrent episode, moderate ([facility]) Yes   Collaboration of Care: Other n/a at time of session.  Patient/Guardian was advised Release of Information must be obtained prior to any record release in order to collaborate their care with an outside provider. Patient/Guardian was advised if they have not already done so to contact the registration department to sign all necessary forms in order for [place] to release information regarding their care.   Consent: Patient/Guardian gives verbal consent for treatment and assignment of benefits  for services provided during this visit. Patient/Guardian  expressed understanding and agreed to proceed.   Ernest Haber Amiir Heckard, LCSW 01/29/2023

## 2023-03-03 ENCOUNTER — Ambulatory Visit (INDEPENDENT_AMBULATORY_CARE_PROVIDER_SITE_OTHER): Payer: PPO | Admitting: Licensed Clinical Social Worker

## 2023-03-03 DIAGNOSIS — F331 Major depressive disorder, recurrent, moderate: Secondary | ICD-10-CM | POA: Diagnosis not present

## 2023-03-03 DIAGNOSIS — T148XXA Other injury of unspecified body region, initial encounter: Secondary | ICD-10-CM

## 2023-03-03 NOTE — Progress Notes (Signed)
Virtual Visit via Video Note  I connected with Evelyn Valdez on 03/03/23 at 11:00 AM EDT by a video enabled telemedicine application and verified that I am speaking with the correct person using two identifiers.  Location: Patient: home Provider: remote office [place], [place])   I discussed the limitations of evaluation and management by telemedicine and the availability of in person appointments. The patient expressed understanding and agreed to proceed.   I discussed the assessment and treatment plan with the patient. The patient was provided an opportunity to ask questions and all were answered. The patient agreed with the plan and demonstrated an understanding of the instructions.   The patient was advised to call back or seek an in-person evaluation if the symptoms worsen or if the condition fails to improve as anticipated.  I provided 45 minutes of non-face-to-face time during this encounter.   Remijio Holleran R Lenwood Balsam, LCSW  THERAPIST PROGRESS NOTE  Session Time: 11-1145a  Participation Level: Active  Behavioral Response: Neat and Well GroomedAlertAnxious and Depressed  Type of Therapy: Individual Therapy  Treatment Goals addressed: Decrease depressive symptoms and improve levels of effective functioning-pt reports a decrease in overall depression symptoms 3 out of 5 sessions documented   Develop healthy thinking patterns and beliefs about self, others, and the world that lead to the alleviation and help prevent the relapse of depression per self report 3 out of 5 sessions documented   ProgressTowards Goals: Progressing  Interventions: Solution Focused, Supportive, and Other: Trauma focused CBT  Summary: Evelyn Valdez is a 82 y.o. female who presents with improving symptoms related to depression diagnosis.   Clinician assisted pt with identifying current levels of chronic pain, and explored previously recommended (by medical community) interventions for treating  chronic pain. Pt states she has got injections recently in back and is currently taking pain medication. Pt expresses that she felt a little disappointed when the doctor stated "you may have to learn to deal with this pain". Discussed overall impact of pain on daily activities, relationships, and ability/inability to engage in self care or recreational activities. Pt expressed gratitude that her husband is there and helps her as much as he does. Pt reports that she does not feel well for todays appointment and is planning on going to the doctor.  Discussed current visit with brother--he is staying with them for 3 weeks. Pt states after this visit they are traveling to [place] and will stay with family soon "Its something that I really enjoy".  Discussed continuing excoriation on facial skin--discussed several behavior modifications--reviewed behavior modification that have been addressed in previous sessions. Pt has appointment with dermatologist soon and will allow the doctor to make recommendations as well.   Allowed pt opportunity to identify growth since starting counseling. Reviewed coping skills for managing depression and anxiety.   Continued recommendations are as follows: self care behaviors, positive social engagements, focusing on overall work/home/life balance, and focusing on positive physical and emotional wellness.   Suicidal/Homicidal: No  Therapist Response: Pt is continuing to apply interventions learned in session into daily life situations. Pt is currently on track to meet goals utilizing interventions mentioned above. Personal growth and progress noted. Treatment to continue as indicated.   Plan: Informed patient that clinician will be leaving outpatient department. Allowed pt to explore any questions or concerns and discussed future counseling options/resources. Provided pt with psychoeducational resources and list of OPT therapists. Encouraged pt to continue with psychiatric med  management appointments, if applicable.   Diagnosis:  Encounter Diagnoses  Name Primary?   MDD (major depressive disorder), recurrent episode, moderate (HCC) Yes   Excoriation    Collaboration of Care: Other n/a at time of session.  Patient/Guardian was advised Release of Information must be obtained prior to any record release in order to collaborate their care with an outside provider. Patient/Guardian was advised if they have not already done so to contact the registration department to sign all necessary forms in order for [place] to release information regarding their care.   Consent: Patient/Guardian gives verbal consent for treatment and assignment of benefits for services provided during this visit. Patient/Guardian expressed understanding and agreed to proceed.   Ernest Haber Najmah Carradine, LCSW 03/03/2023

## 2023-03-04 NOTE — Patient Instructions (Signed)
Outpatient Psychiatry and Counseling  FOR CRISIS:  call 911, [facility]: Mobile Crisis Management 24 hours:  [phone], call [phone], [facility] ([facility]) [address] walk in, or go to your local EMERGENCY DEPARTMENT  [facility] sliding scale fee and walk in schedule: M-F 8am-12pm/1pm-3pm [address]  [place], [place] [address] [phone]  [facility] [address] [place], [place] [address] [phone]  [facility]/ Intensive Outpatient Therapy Program/CDIOP/PHP [address] [place], [place] [address] [phone]  [facility]                  Crisis Services, Outpatient Therapy Services, Walk in Services      [phone]     [address]    [place], [place] [address]                 [facility]   [facility] [phone] [address] [place], [place] [address]  [facility]          [address],  [place], [place] [address]       [phone]  [facility] [address] [place], [place] [address] [phone]  [facility]    [address]    [place], [place] [address]     [phone]       [facility] [address] [place] [address]  [facility]     [address]     [place], [place]      [phone]       [facility] Shamsher Ahluwalia, MD [address] [place], [place] [address] [phone]  [facility]     [address]     [place], [place] [address]     [phone]       [facility] [address] ,  [address] [phone] Resources for Temporary Residential Assistance/Crisis Centers
